# Patient Record
Sex: Female | Born: 1955
Health system: Southern US, Community
[De-identification: ages and names within clinical notes are randomized; demographics above are authoritative.]

## PROBLEM LIST (undated history)

## (undated) DIAGNOSIS — K219 Gastro-esophageal reflux disease without esophagitis: Secondary | ICD-10-CM

## (undated) DIAGNOSIS — T8859XA Other complications of anesthesia, initial encounter: Secondary | ICD-10-CM

## (undated) DIAGNOSIS — I499 Cardiac arrhythmia, unspecified: Secondary | ICD-10-CM

## (undated) DIAGNOSIS — G43909 Migraine, unspecified, not intractable, without status migrainosus: Secondary | ICD-10-CM

## (undated) DIAGNOSIS — K5790 Diverticulosis of intestine, part unspecified, without perforation or abscess without bleeding: Secondary | ICD-10-CM

## (undated) DIAGNOSIS — C801 Malignant (primary) neoplasm, unspecified: Secondary | ICD-10-CM

## (undated) DIAGNOSIS — R109 Unspecified abdominal pain: Secondary | ICD-10-CM

## (undated) DIAGNOSIS — C50919 Malignant neoplasm of unspecified site of unspecified female breast: Secondary | ICD-10-CM

## (undated) DIAGNOSIS — M5416 Radiculopathy, lumbar region: Secondary | ICD-10-CM

## (undated) DIAGNOSIS — M1990 Unspecified osteoarthritis, unspecified site: Secondary | ICD-10-CM

## (undated) DIAGNOSIS — D126 Benign neoplasm of colon, unspecified: Secondary | ICD-10-CM

## (undated) DIAGNOSIS — Z923 Personal history of irradiation: Secondary | ICD-10-CM

## (undated) DIAGNOSIS — Z5189 Encounter for other specified aftercare: Secondary | ICD-10-CM

## (undated) DIAGNOSIS — M810 Age-related osteoporosis without current pathological fracture: Secondary | ICD-10-CM

## (undated) DIAGNOSIS — G4700 Insomnia, unspecified: Secondary | ICD-10-CM

## (undated) DIAGNOSIS — F32A Depression, unspecified: Secondary | ICD-10-CM

## (undated) DIAGNOSIS — Z9221 Personal history of antineoplastic chemotherapy: Secondary | ICD-10-CM

## (undated) DIAGNOSIS — E559 Vitamin D deficiency, unspecified: Secondary | ICD-10-CM

## (undated) DIAGNOSIS — R112 Nausea with vomiting, unspecified: Secondary | ICD-10-CM

## (undated) DIAGNOSIS — T4145XA Adverse effect of unspecified anesthetic, initial encounter: Secondary | ICD-10-CM

## (undated) DIAGNOSIS — R197 Diarrhea, unspecified: Secondary | ICD-10-CM

## (undated) DIAGNOSIS — I2510 Atherosclerotic heart disease of native coronary artery without angina pectoris: Secondary | ICD-10-CM

## (undated) DIAGNOSIS — Z973 Presence of spectacles and contact lenses: Secondary | ICD-10-CM

## (undated) DIAGNOSIS — E785 Hyperlipidemia, unspecified: Secondary | ICD-10-CM

## (undated) DIAGNOSIS — F419 Anxiety disorder, unspecified: Secondary | ICD-10-CM

## (undated) DIAGNOSIS — F329 Major depressive disorder, single episode, unspecified: Secondary | ICD-10-CM

## (undated) DIAGNOSIS — M5126 Other intervertebral disc displacement, lumbar region: Secondary | ICD-10-CM

## (undated) DIAGNOSIS — Z9889 Other specified postprocedural states: Secondary | ICD-10-CM

## (undated) DIAGNOSIS — G5790 Unspecified mononeuropathy of unspecified lower limb: Secondary | ICD-10-CM

## (undated) HISTORY — DX: Unspecified abdominal pain: R10.9

## (undated) HISTORY — DX: Age-related osteoporosis without current pathological fracture: M81.0

## (undated) HISTORY — DX: Unspecified osteoarthritis, unspecified site: M19.90

## (undated) HISTORY — PX: HIP SURGERY: SHX245

## (undated) HISTORY — DX: Gastro-esophageal reflux disease without esophagitis: K21.9

## (undated) HISTORY — DX: Insomnia, unspecified: G47.00

## (undated) HISTORY — DX: Vitamin D deficiency, unspecified: E55.9

## (undated) HISTORY — DX: Hyperlipidemia, unspecified: E78.5

## (undated) HISTORY — DX: Encounter for other specified aftercare: Z51.89

## (undated) HISTORY — DX: Benign neoplasm of colon, unspecified: D12.6

## (undated) HISTORY — PX: COLONOSCOPY: SHX174

## (undated) HISTORY — DX: Radiculopathy, lumbar region: M54.16

## (undated) HISTORY — DX: Diarrhea, unspecified: R19.7

## (undated) HISTORY — DX: Other intervertebral disc displacement, lumbar region: M51.26

---
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
## 1980-10-07 HISTORY — PX: DILATION AND CURETTAGE OF UTERUS: SHX78

## 2003-10-08 DIAGNOSIS — Z5189 Encounter for other specified aftercare: Secondary | ICD-10-CM

## 2003-10-08 HISTORY — DX: Encounter for other specified aftercare: Z51.89

## 2003-10-08 HISTORY — PX: HIP SURGERY: SHX245

## 2005-10-07 HISTORY — PX: WRIST SURGERY: SHX841

## 2006-10-07 DIAGNOSIS — Z923 Personal history of irradiation: Secondary | ICD-10-CM

## 2006-10-07 DIAGNOSIS — C801 Malignant (primary) neoplasm, unspecified: Secondary | ICD-10-CM

## 2006-10-07 HISTORY — PX: BREAST LUMPECTOMY: SHX2

## 2006-10-07 HISTORY — PX: BREAST EXCISIONAL BIOPSY: SUR124

## 2006-10-07 HISTORY — DX: Malignant (primary) neoplasm, unspecified: C80.1

## 2006-10-07 HISTORY — DX: Personal history of irradiation: Z92.3

## 2009-10-07 DIAGNOSIS — C801 Malignant (primary) neoplasm, unspecified: Secondary | ICD-10-CM

## 2009-10-07 HISTORY — DX: Malignant (primary) neoplasm, unspecified: C80.1

## 2010-10-07 HISTORY — PX: CARDIAC CATHETERIZATION: SHX172

## 2013-02-11 ENCOUNTER — Other Ambulatory Visit (HOSPITAL_COMMUNITY): Payer: Self-pay | Admitting: Internal Medicine

## 2013-02-11 DIAGNOSIS — Z139 Encounter for screening, unspecified: Secondary | ICD-10-CM

## 2013-02-11 DIAGNOSIS — C50911 Malignant neoplasm of unspecified site of right female breast: Secondary | ICD-10-CM

## 2013-02-20 ENCOUNTER — Emergency Department (HOSPITAL_COMMUNITY): Payer: PRIVATE HEALTH INSURANCE

## 2013-02-20 ENCOUNTER — Emergency Department (HOSPITAL_COMMUNITY)
Admission: EM | Admit: 2013-02-20 | Discharge: 2013-02-20 | Disposition: A | Discharge status: Home / Self Care | Payer: PRIVATE HEALTH INSURANCE | Attending: Emergency Medicine | Admitting: Emergency Medicine

## 2013-02-20 ENCOUNTER — Encounter (HOSPITAL_COMMUNITY): Payer: Self-pay | Admitting: Emergency Medicine

## 2013-02-20 DIAGNOSIS — Z7982 Long term (current) use of aspirin: Secondary | ICD-10-CM | POA: Insufficient documentation

## 2013-02-20 DIAGNOSIS — Y92009 Unspecified place in unspecified non-institutional (private) residence as the place of occurrence of the external cause: Secondary | ICD-10-CM | POA: Insufficient documentation

## 2013-02-20 DIAGNOSIS — W010XXA Fall on same level from slipping, tripping and stumbling without subsequent striking against object, initial encounter: Secondary | ICD-10-CM | POA: Insufficient documentation

## 2013-02-20 DIAGNOSIS — Y939 Activity, unspecified: Secondary | ICD-10-CM | POA: Insufficient documentation

## 2013-02-20 DIAGNOSIS — Z79899 Other long term (current) drug therapy: Secondary | ICD-10-CM | POA: Insufficient documentation

## 2013-02-20 DIAGNOSIS — S93409A Sprain of unspecified ligament of unspecified ankle, initial encounter: Secondary | ICD-10-CM | POA: Insufficient documentation

## 2013-02-20 MED ORDER — HYDROCODONE-ACETAMINOPHEN 5-325 MG PO TABS: 1.0000 | ORAL_TABLET | ORAL | Status: DC | PRN

## 2013-02-20 MED ORDER — HYDROCODONE-ACETAMINOPHEN 5-325 MG PO TABS
1.0000 | ORAL_TABLET | Freq: Once | ORAL | Status: AC
  Administered 2013-02-20: 1 via ORAL
  Filled 2013-02-20: qty 1

## 2013-02-20 NOTE — ED Provider Notes (Signed)
History     CSN: [id number]  Arrival date & time 02/20/13  1053   First MD Initiated Contact with Patient 02/20/13 1111      Chief Complaint  Patient presents with  . Foot Injury    (Consider location/radiation/quality/duration/timing/severity/associated sxs/prior treatment) Patient is a 57 y.o. female presenting with foot injury. The history is provided by the patient.  Foot Injury Location:  Foot Time since incident:  4 hours Injury: yes   Mechanism of injury: fall   Fall:    Fall occurred:  In the bathroom   Impact surface:  Wall   Point of impact:  Feet Foot location:  R foot Pain details:    Quality:  Throbbing   Radiates to:  Does not radiate   Severity:  Moderate   Onset quality:  Sudden   Timing:  Constant   Progression:  Worsening Chronicity: New on old (hx of previous fx of the foot) Dislocation: no   Foreign body present: fb in the foot for nearly 20 years, but no new fb. Prior injury to area:  Yes Relieved by:  Nothing Worsened by:  Bearing weight Ineffective treatments:  None tried Associated symptoms: decreased ROM   Associated symptoms: no back pain and no neck pain   Risk factors comment:  Previous fx of the right 5th MT   History reviewed. No pertinent past medical history.  History reviewed. No pertinent past surgical history.  History reviewed. No pertinent family history.  History  Substance Use Topics  . Smoking status: Not on file  . Smokeless tobacco: Not on file  . Alcohol Use: Not on file    OB History   Grav Para Term Preterm Abortions TAB SAB Ect Mult Living                  Review of Systems  Constitutional: Negative for activity change.       All ROS Neg except as noted in HPI  HENT: Negative for nosebleeds and neck pain.   Eyes: Negative for photophobia and discharge.  Respiratory: Negative for cough, shortness of breath and wheezing.   Cardiovascular: Negative for chest pain and palpitations.  Gastrointestinal:  Negative for abdominal pain and blood in stool.  Genitourinary: Negative for dysuria, frequency and hematuria.  Musculoskeletal: Positive for arthralgias. Negative for back pain.  Skin: Negative.   Neurological: Negative for dizziness, seizures and speech difficulty.  Psychiatric/Behavioral: Negative for hallucinations and confusion.    Allergies  Review of patient's allergies indicates no known allergies.  Home Medications   Current Outpatient Rx  Name  Route  Sig  Dispense  Refill  . ALPRAZolam (XANAX) 1 MG tablet   Oral   Take 1 mg by mouth at bedtime as needed for sleep.         Marland Kitchen aspirin 325 MG tablet   Oral   Take 325 mg by mouth daily.         Marland Kitchen atorvastatin (LIPITOR) 40 MG tablet   Oral   Take 40 mg by mouth daily.         Marland Kitchen ibuprofen (ADVIL,MOTRIN) 200 MG tablet   Oral   Take 800 mg by mouth every 6 (six) hours as needed for pain.         . Levomilnacipran HCl ER (FETZIMA) 40 MG CP24   Oral   Take 1 tablet by mouth daily.         Marland Kitchen omeprazole (PRILOSEC) 20 MG capsule   Oral  Take 20 mg by mouth daily as needed (acid reflux).           BP 111/72  Temp(Src) 97.8 F (36.6 C) (Oral)  Resp 18  Physical Exam  Nursing note and vitals reviewed. Constitutional: She is oriented to person, place, and time. She appears well-developed and well-nourished.  Non-toxic appearance.  HENT:  Head: Normocephalic.  Right Ear: Tympanic membrane and external ear normal.  Left Ear: Tympanic membrane and external ear normal.  Eyes: EOM and lids are normal. Pupils are equal, round, and reactive to light.  Neck: Normal range of motion. Neck supple. Carotid bruit is not present.  Cardiovascular: Normal rate, regular rhythm, normal heart sounds, intact distal pulses and normal pulses.   Pulmonary/Chest: Breath sounds normal. No respiratory distress.  Abdominal: Soft. Bowel sounds are normal. There is no tenderness. There is no guarding.  Musculoskeletal: Normal range  of motion. She exhibits tenderness.  Mild Swelling of the lateral malleolus.  Soreness with ROM.  Lymphadenopathy:       Head (right side): No submandibular adenopathy present.       Head (left side): No submandibular adenopathy present.    She has no cervical adenopathy.  Neurological: She is alert and oriented to person, place, and time. She has normal strength. No cranial nerve deficit or sensory deficit.  Skin: Skin is warm and dry.  Psychiatric: She has a normal mood and affect. Her speech is normal.    ED Course  Procedures (including critical care time)  Labs Reviewed - No data to display Dg Foot Complete Right  02/20/2013   *RADIOLOGY REPORT*  Clinical Data: Foot injury with foot pain.  History of remote foreign body in the right foot and remote fifth metatarsal fracture.  RIGHT FOOT COMPLETE - 3+ VIEW  Comparison: None  Findings: A remote fracture of the distal fifth metatarsal is noted. A linear radiopaque foreign body in the dorsal soft tissues at the level of the third and fourth metatarsal heads noted. There is no evidence of acute fracture, subluxation or dislocation. The Lisfranc joints are intact. No focal bony lesions are identified.  IMPRESSION: No evidence of acute bony abnormality.  Linear radiopaque foreign body in the dorsal soft tissues - remote by history.   Original Report Authenticated By: Harmon Pier, M.D.     No diagnosis found.    MDM  I have reviewed nursing notes, vital signs, and all appropriate lab and imaging results for this patient. The right foot is neurovascularly intact. Xray is negative for new fx or dislocation. Pt fitted with ASO splint  And ice pack. Pt to follow up with ortho if any changes or problem.       Kathie Dike, PA-C 02/20/13 1242

## 2013-02-20 NOTE — ED Notes (Signed)
Pt states she tripped on fell at home this morning and c/o right foot pain. Mild swelling to right foot. Pt ambulatory with limp.

## 2013-02-21 NOTE — ED Provider Notes (Signed)
Medical screening examination/treatment/procedure(s) were performed by non-physician practitioner and as supervising physician I was immediately available for consultation/collaboration.   Joya Gaskins, MD 02/21/13 [phone]

## 2013-02-22 ENCOUNTER — Ambulatory Visit (HOSPITAL_COMMUNITY): Payer: Self-pay

## 2013-02-28 ENCOUNTER — Emergency Department (HOSPITAL_COMMUNITY): Payer: PRIVATE HEALTH INSURANCE

## 2013-02-28 ENCOUNTER — Emergency Department (HOSPITAL_COMMUNITY)
Admission: EM | Admit: 2013-02-28 | Discharge: 2013-02-28 | Disposition: A | Discharge status: Home / Self Care | Payer: PRIVATE HEALTH INSURANCE | Attending: Emergency Medicine | Admitting: Emergency Medicine

## 2013-02-28 ENCOUNTER — Encounter (HOSPITAL_COMMUNITY): Payer: Self-pay | Admitting: Emergency Medicine

## 2013-02-28 DIAGNOSIS — E7800 Pure hypercholesterolemia, unspecified: Secondary | ICD-10-CM | POA: Insufficient documentation

## 2013-02-28 DIAGNOSIS — Z859 Personal history of malignant neoplasm, unspecified: Secondary | ICD-10-CM | POA: Insufficient documentation

## 2013-02-28 DIAGNOSIS — R079 Chest pain, unspecified: Secondary | ICD-10-CM

## 2013-02-28 DIAGNOSIS — I2510 Atherosclerotic heart disease of native coronary artery without angina pectoris: Secondary | ICD-10-CM | POA: Insufficient documentation

## 2013-02-28 DIAGNOSIS — M129 Arthropathy, unspecified: Secondary | ICD-10-CM | POA: Insufficient documentation

## 2013-02-28 DIAGNOSIS — F17200 Nicotine dependence, unspecified, uncomplicated: Secondary | ICD-10-CM | POA: Insufficient documentation

## 2013-02-28 DIAGNOSIS — R51 Headache: Secondary | ICD-10-CM | POA: Insufficient documentation

## 2013-02-28 DIAGNOSIS — Z7982 Long term (current) use of aspirin: Secondary | ICD-10-CM | POA: Insufficient documentation

## 2013-02-28 DIAGNOSIS — F411 Generalized anxiety disorder: Secondary | ICD-10-CM | POA: Insufficient documentation

## 2013-02-28 DIAGNOSIS — R109 Unspecified abdominal pain: Secondary | ICD-10-CM | POA: Insufficient documentation

## 2013-02-28 DIAGNOSIS — R0789 Other chest pain: Secondary | ICD-10-CM | POA: Insufficient documentation

## 2013-02-28 DIAGNOSIS — Z79899 Other long term (current) drug therapy: Secondary | ICD-10-CM | POA: Insufficient documentation

## 2013-02-28 DIAGNOSIS — Z9889 Other specified postprocedural states: Secondary | ICD-10-CM | POA: Insufficient documentation

## 2013-02-28 HISTORY — DX: Anxiety disorder, unspecified: F41.9

## 2013-02-28 HISTORY — DX: Unspecified osteoarthritis, unspecified site: M19.90

## 2013-02-28 HISTORY — DX: Atherosclerotic heart disease of native coronary artery without angina pectoris: I25.10

## 2013-02-28 HISTORY — DX: Malignant (primary) neoplasm, unspecified: C80.1

## 2013-02-28 LAB — COMPREHENSIVE METABOLIC PANEL
AST: 27 U/L (ref 0–37)
Albumin: 3.8 g/dL (ref 3.5–5.2)
BUN: 11 mg/dL (ref 6–23)
Calcium: 9.2 mg/dL (ref 8.4–10.5)
Chloride: 101 mEq/L (ref 96–112)
Creatinine, Ser: 0.62 mg/dL (ref 0.50–1.10)
GFR calc Af Amer: >90 mL/min (ref >90)
GFR calc non Af Amer: >90 mL/min (ref >90)
Total Bilirubin: 0.3 mg/dL (ref 0.3–1.2)
Total Protein: 6.9 g/dL (ref 6.0–8.3)

## 2013-02-28 LAB — CBC WITH DIFFERENTIAL/PLATELET
Basophils Absolute: 0 10*3/uL (ref 0.0–0.1)
Basophils Relative: 1 % (ref 0–1)
Eosinophils Absolute: 0.1 10*3/uL (ref 0.0–0.7)
Eosinophils Relative: 1 % (ref 0–5)
HCT: 39.7 % (ref 36.0–46.0)
MCH: 32 pg (ref 26.0–34.0)
MCHC: 33.2 g/dL (ref 30.0–36.0)
MCV: 96.4 fL (ref 78.0–100.0)
Monocytes Absolute: 0.5 10*3/uL (ref 0.1–1.0)
Platelets: 317 10*3/uL (ref 150–400)
RDW: 12.7 % (ref 11.5–15.5)
WBC: 6.3 10*3/uL (ref 4.0–10.5)

## 2013-02-28 LAB — TROPONIN I: Troponin I: <0.3 ng/mL (ref <0.30)

## 2013-02-28 MED ORDER — HYDROCODONE-ACETAMINOPHEN 5-325 MG PO TABS
1.0000 | ORAL_TABLET | Freq: Once | ORAL | Status: AC
  Administered 2013-02-28: 1 via ORAL
  Filled 2013-02-28: qty 1

## 2013-02-28 MED ORDER — GI COCKTAIL ~~LOC~~
30.0000 mL | Freq: Once | ORAL | Status: AC
  Administered 2013-02-28: 30 mL via ORAL
  Filled 2013-02-28: qty 30

## 2013-02-28 MED ORDER — FAMOTIDINE 20 MG PO TABS
20.0000 mg | ORAL_TABLET | Freq: Once | ORAL | Status: AC
  Administered 2013-02-28: 20 mg via ORAL
  Filled 2013-02-28: qty 1

## 2013-02-28 MED ORDER — OXYCODONE-ACETAMINOPHEN 5-325 MG PO TABS: 1.0000 | ORAL_TABLET | Freq: Four times a day (QID) | ORAL | Status: DC | PRN

## 2013-02-28 MED ORDER — SODIUM CHLORIDE 0.9 % IV SOLN
INTRAVENOUS | Status: DC
  Administered 2013-02-28: 19:00:00 via INTRAVENOUS

## 2013-02-28 MED ORDER — ALPRAZOLAM 0.5 MG PO TABS
0.5000 mg | ORAL_TABLET | Freq: Once | ORAL | Status: AC
  Administered 2013-02-28: 0.5 mg via ORAL
  Filled 2013-02-28: qty 1

## 2013-02-28 NOTE — ED Notes (Signed)
Pt under a lot of stress this week. One death and an MI in family.

## 2013-02-28 NOTE — ED Notes (Signed)
Pt c/o chest tightness, headache and nausea. Denies dizziness/v/d/sob. Pt alert/oreinted. Nondiaphoretic.

## 2013-02-28 NOTE — ED Provider Notes (Addendum)
History     CSN: [id number]  Arrival date & time 02/28/13  1759   First MD Initiated Contact with Patient 02/28/13 1827      Chief Complaint  Patient presents with  . Chest Pain    (Consider location/radiation/quality/duration/timing/severity/associated sxs/prior treatment) Patient is a 57 y.o. female presenting with chest pain. The history is provided by the patient.  Chest Pain Associated symptoms: abdominal pain and headache   Associated symptoms: no back pain, no cough, no fever, no numbness, no palpitations, no shortness of breath, not vomiting and no weakness   pt c/o epigastric/xiphoid area pain today. Constant. Dull. Non radiating. Pt tearful, noting tremendous stress recently due to loss of family member to illness, and another family member w cva. Pt w hx prior cath, [facility] in [place], states was told had a 20% blockage. Pain/symptoms are at rest.  Denies any other recent cp or discomfort even w exertion. No associated nv, diaphoresis or sob.  Pain is midline and not pleuritic. No leg pain or swelling. No hx dvt or pe. Family hx heart disease in older age. Pt denies cough or uri c/o. No fever or chills. ?hx gerd. No hx gallstones or pud. Pt also notes gradual onset frontal headache today, dull. No eye pain or change in vision. No neck pain or stiffness. No numbness/weakness.     Past Medical History  Diagnosis Date  . Hypercholesteremia   . Coronary artery disease   . Cancer   . Arthritis   . Anxiety     Past Surgical History  Procedure Laterality Date  . Cardiac catheterization    . Breast lumpectomy    . Hip surgery      History reviewed. No pertinent family history.  History  Substance Use Topics  . Smoking status: Current Every Day Smoker  . Smokeless tobacco: Not on file  . Alcohol Use: No    OB History   Grav Para Term Preterm Abortions TAB SAB Ect Mult Living                  Review of Systems  Constitutional: Negative for  fever and chills.  HENT: Negative for neck pain.   Eyes: Negative for redness.  Respiratory: Negative for cough and shortness of breath.   Cardiovascular: Positive for chest pain. Negative for palpitations and leg swelling.  Gastrointestinal: Positive for abdominal pain. Negative for vomiting, diarrhea and abdominal distention.  Genitourinary: Negative for flank pain.  Musculoskeletal: Negative for back pain.  Skin: Negative for rash.  Neurological: Positive for headaches. Negative for weakness and numbness.  Hematological: Does not bruise/bleed easily.  Psychiatric/Behavioral: Negative for confusion.    Allergies  Review of patient's allergies indicates no known allergies.  Home Medications   Current Outpatient Rx  Name  Route  Sig  Dispense  Refill  . ALPRAZolam (XANAX) 1 MG tablet   Oral   Take 1 mg by mouth at bedtime as needed for sleep.         Marland Kitchen aspirin 325 MG tablet   Oral   Take 325 mg by mouth daily.         Marland Kitchen atorvastatin (LIPITOR) 40 MG tablet   Oral   Take 40 mg by mouth daily.         Marland Kitchen HYDROcodone-acetaminophen (NORCO/VICODIN) 5-325 MG per tablet   Oral   Take 1 tablet by mouth every 4 (four) hours as needed for pain.   10 tablet   0   .  ibuprofen (ADVIL,MOTRIN) 200 MG tablet   Oral   Take 800 mg by mouth every 6 (six) hours as needed for pain.         . Levomilnacipran HCl ER (FETZIMA) 40 MG CP24   Oral   Take 1 tablet by mouth daily.         Marland Kitchen omeprazole (PRILOSEC) 20 MG capsule   Oral   Take 20 mg by mouth daily as needed (acid reflux).           BP 113/56  Pulse 104  Temp(Src) 98.6 F (37 C) (Oral)  Resp 17  Ht 5\' 5"  (1.651 m)  Wt 169 lb (76.658 kg)  BMI 28.12 kg/m2  SpO2 100%  Physical Exam  Nursing note and vitals reviewed. Constitutional: She is oriented to person, place, and time. She appears well-developed and well-nourished. No distress.  HENT:  Nose: Nose normal.  Mouth/Throat: Oropharynx is clear and moist.   Eyes: Conjunctivae are normal. Pupils are equal, round, and reactive to light. No scleral icterus.  Neck: Neck supple. No tracheal deviation present.  Cardiovascular: Normal rate, regular rhythm, normal heart sounds and intact distal pulses.  Exam reveals no gallop and no friction rub.   No murmur heard. Pulmonary/Chest: Effort normal and breath sounds normal. No respiratory distress.  Abdominal: Soft. Normal appearance and bowel sounds are normal. She exhibits no distension. There is no tenderness.  Genitourinary:  No cva tenderness  Musculoskeletal: She exhibits no edema and no tenderness.  Neurological: She is alert and oriented to person, place, and time.  Skin: Skin is warm and dry. No rash noted.  Psychiatric:  Very anxious/tearful.     ED Course  Procedures (including critical care time)   Results for orders placed during the hospital encounter of 02/28/13  CBC WITH DIFFERENTIAL      Result Value Range   WBC 6.3  4.0 - 10.5 K/uL   RBC 4.12  3.87 - 5.11 MIL/uL   Hemoglobin 13.2  12.0 - 15.0 g/dL   HCT 28.4  13.2 - 44.0 %   MCV 96.4  78.0 - 100.0 fL   MCH 32.0  26.0 - 34.0 pg   MCHC 33.2  30.0 - 36.0 g/dL   RDW 10.2  72.5 - 36.6 %   Platelets 317  150 - 400 K/uL   Neutrophils Relative % 71  43 - 77 %   Neutro Abs 4.5  1.7 - 7.7 K/uL   Lymphocytes Relative 19  12 - 46 %   Lymphs Abs 1.2  0.7 - 4.0 K/uL   Monocytes Relative 8  3 - 12 %   Monocytes Absolute 0.5  0.1 - 1.0 K/uL   Eosinophils Relative 1  0 - 5 %   Eosinophils Absolute 0.1  0.0 - 0.7 K/uL   Basophils Relative 1  0 - 1 %   Basophils Absolute 0.0  0.0 - 0.1 K/uL  TROPONIN I      Result Value Range   Troponin I <0.30  <0.30 ng/mL  COMPREHENSIVE METABOLIC PANEL      Result Value Range   Sodium 137  135 - 145 mEq/L   Potassium 3.7  3.5 - 5.1 mEq/L   Chloride 101  96 - 112 mEq/L   CO2 26  19 - 32 mEq/L   Glucose, Bld 95  70 - 99 mg/dL   BUN 11  6 - 23 mg/dL   Creatinine, Ser 4.40  0.50 - 1.10 mg/dL  Calcium 9.2  8.4 - 10.5 mg/dL   Total Protein 6.9  6.0 - 8.3 g/dL   Albumin 3.8  3.5 - 5.2 g/dL   AST 27  0 - 37 U/L   ALT 26  0 - 35 U/L   Alkaline Phosphatase 69  39 - 117 U/L   Total Bilirubin 0.3  0.3 - 1.2 mg/dL   GFR calc non Af Amer >90  >90 mL/min   GFR calc Af Amer >90  >90 mL/min  LIPASE, BLOOD      Result Value Range   Lipase 33  11 - 59 U/L   Dg Chest 2 View  02/28/2013   *RADIOLOGY REPORT*  Clinical Data: Chest pain.  Prior history breast cancer with lumpectomy.  CHEST - 2 VIEW  Comparison: None.  Findings: Cardiomediastinal silhouette unremarkable.  Prominent bronchovascular markings diffusely and moderate central peribronchial thickening.  Lungs otherwise clear.  Pulmonary vascularity normal.  No pneumothorax.  No pleural effusions.  Mild degenerative changes involving the thoracic spine.  IMPRESSION: Moderate changes of bronchitis and/or asthma which may be acute or chronic.   Original Report Authenticated By: Hulan Saas, M.D.       MDM  Iv ns. Ecg. Labs. Cxr.  Reviewed nursing notes and prior charts for additional history.   Pepcid, gi cocktail, vicodin 1 po, xanax .5 po.   Date: 02/28/2013  Rate: 94  Rhythm: normal sinus rhythm  QRS Axis: normal  Intervals: normal  ST/T Wave abnormalities: normal  Conduction Disutrbances:none  Narrative Interpretation:   Old EKG Reviewed: none available  Records from [facility] in [place] obtained. ecg unchanged from prior.  Pt w cath 6/13 w non obstructive cad, single 20% lesion noted.   Recheck pt calmer, alert, no distress.   Recheck w above meds for symptom relief, symptoms resolved.  Recheck pt, calm, alert. No cp or sob. Symptoms resolved. States feels ready for d/c.  States has close f/u w pcp, [facility], already arranged.    Pt/fam requests small amt pain med (percocet) for home.          Suzi Roots, MD 02/28/13 2049

## 2013-03-12 ENCOUNTER — Other Ambulatory Visit (HOSPITAL_COMMUNITY): Payer: Self-pay | Admitting: Internal Medicine

## 2013-03-12 DIAGNOSIS — R1013 Epigastric pain: Secondary | ICD-10-CM

## 2013-03-12 DIAGNOSIS — R109 Unspecified abdominal pain: Secondary | ICD-10-CM

## 2013-03-17 ENCOUNTER — Ambulatory Visit (HOSPITAL_COMMUNITY)
Admission: RE | Admit: 2013-03-17 | Discharge: 2013-03-17 | Disposition: A | Discharge status: Home / Self Care | Payer: PRIVATE HEALTH INSURANCE | Source: Ambulatory Visit | Attending: Internal Medicine | Admitting: Internal Medicine

## 2013-03-17 DIAGNOSIS — Z853 Personal history of malignant neoplasm of breast: Secondary | ICD-10-CM | POA: Insufficient documentation

## 2013-03-17 DIAGNOSIS — C50911 Malignant neoplasm of unspecified site of right female breast: Secondary | ICD-10-CM

## 2013-03-18 ENCOUNTER — Ambulatory Visit (HOSPITAL_COMMUNITY)
Admission: RE | Admit: 2013-03-18 | Discharge: 2013-03-18 | Disposition: A | Discharge status: Home / Self Care | Payer: PRIVATE HEALTH INSURANCE | Source: Ambulatory Visit | Attending: Internal Medicine | Admitting: Internal Medicine

## 2013-03-18 ENCOUNTER — Other Ambulatory Visit (HOSPITAL_COMMUNITY): Payer: Self-pay | Admitting: Internal Medicine

## 2013-03-18 DIAGNOSIS — R110 Nausea: Secondary | ICD-10-CM | POA: Insufficient documentation

## 2013-03-18 DIAGNOSIS — R109 Unspecified abdominal pain: Secondary | ICD-10-CM | POA: Insufficient documentation

## 2013-03-18 DIAGNOSIS — R1013 Epigastric pain: Secondary | ICD-10-CM

## 2013-03-18 DIAGNOSIS — K838 Other specified diseases of biliary tract: Secondary | ICD-10-CM | POA: Insufficient documentation

## 2013-04-05 ENCOUNTER — Emergency Department (HOSPITAL_COMMUNITY)
Admission: EM | Admit: 2013-04-05 | Discharge: 2013-04-05 | Disposition: A | Discharge status: Home / Self Care | Payer: PRIVATE HEALTH INSURANCE | Attending: Emergency Medicine | Admitting: Emergency Medicine

## 2013-04-05 ENCOUNTER — Encounter (HOSPITAL_COMMUNITY): Payer: Self-pay | Admitting: *Deleted

## 2013-04-05 ENCOUNTER — Emergency Department (HOSPITAL_COMMUNITY): Payer: PRIVATE HEALTH INSURANCE

## 2013-04-05 DIAGNOSIS — Z853 Personal history of malignant neoplasm of breast: Secondary | ICD-10-CM | POA: Insufficient documentation

## 2013-04-05 DIAGNOSIS — F17200 Nicotine dependence, unspecified, uncomplicated: Secondary | ICD-10-CM | POA: Insufficient documentation

## 2013-04-05 DIAGNOSIS — M129 Arthropathy, unspecified: Secondary | ICD-10-CM | POA: Insufficient documentation

## 2013-04-05 DIAGNOSIS — Z79899 Other long term (current) drug therapy: Secondary | ICD-10-CM | POA: Insufficient documentation

## 2013-04-05 DIAGNOSIS — E7800 Pure hypercholesterolemia, unspecified: Secondary | ICD-10-CM | POA: Insufficient documentation

## 2013-04-05 DIAGNOSIS — J40 Bronchitis, not specified as acute or chronic: Secondary | ICD-10-CM

## 2013-04-05 DIAGNOSIS — J209 Acute bronchitis, unspecified: Secondary | ICD-10-CM | POA: Insufficient documentation

## 2013-04-05 DIAGNOSIS — I2510 Atherosclerotic heart disease of native coronary artery without angina pectoris: Secondary | ICD-10-CM | POA: Insufficient documentation

## 2013-04-05 DIAGNOSIS — F411 Generalized anxiety disorder: Secondary | ICD-10-CM | POA: Insufficient documentation

## 2013-04-05 MED ORDER — ONDANSETRON 4 MG PO TBDP: 4.0000 mg | ORAL_TABLET | Freq: Three times a day (TID) | ORAL | Status: DC | PRN

## 2013-04-05 MED ORDER — IPRATROPIUM BROMIDE 0.02 % IN SOLN
0.5000 mg | Freq: Once | RESPIRATORY_TRACT | Status: AC
  Administered 2013-04-05: 0.5 mg via RESPIRATORY_TRACT
  Filled 2013-04-05: qty 2.5

## 2013-04-05 MED ORDER — ALBUTEROL SULFATE HFA 108 (90 BASE) MCG/ACT IN AERS: 1.0000 | INHALATION_SPRAY | Freq: Four times a day (QID) | RESPIRATORY_TRACT | Status: DC | PRN

## 2013-04-05 MED ORDER — GUAIFENESIN-CODEINE 100-10 MG/5ML PO SYRP: 5.0000 mL | ORAL_SOLUTION | Freq: Three times a day (TID) | ORAL | Status: DC | PRN

## 2013-04-05 MED ORDER — AZITHROMYCIN 250 MG PO TABS
500.0000 mg | ORAL_TABLET | Freq: Once | ORAL | Status: AC
  Administered 2013-04-05: 500 mg via ORAL
  Filled 2013-04-05: qty 2

## 2013-04-05 MED ORDER — PREDNISONE 10 MG PO TABS: 20.0000 mg | ORAL_TABLET | Freq: Every day | ORAL | Status: DC

## 2013-04-05 MED ORDER — GUAIFENESIN-CODEINE 100-10 MG/5ML PO SOLN
5.0000 mL | Freq: Once | ORAL | Status: AC
  Administered 2013-04-05: 5 mL via ORAL
  Filled 2013-04-05: qty 5

## 2013-04-05 MED ORDER — AZITHROMYCIN 250 MG PO TABS: ORAL_TABLET | ORAL | Status: DC

## 2013-04-05 MED ORDER — PREDNISONE 50 MG PO TABS
60.0000 mg | ORAL_TABLET | Freq: Once | ORAL | Status: AC
  Administered 2013-04-05: 60 mg via ORAL
  Filled 2013-04-05: qty 1

## 2013-04-05 MED ORDER — HYDROCODONE-ACETAMINOPHEN 5-325 MG PO TABS
1.0000 | ORAL_TABLET | Freq: Once | ORAL | Status: AC
  Administered 2013-04-05: 1 via ORAL
  Filled 2013-04-05: qty 1

## 2013-04-05 MED ORDER — ALBUTEROL SULFATE (5 MG/ML) 0.5% IN NEBU
5.0000 mg | INHALATION_SOLUTION | Freq: Once | RESPIRATORY_TRACT | Status: AC
  Administered 2013-04-05: 5 mg via RESPIRATORY_TRACT
  Filled 2013-04-05: qty 1

## 2013-04-05 MED ORDER — HYDROCODONE-ACETAMINOPHEN 5-325 MG PO TABS: 1.0000 | ORAL_TABLET | ORAL | Status: DC | PRN

## 2013-04-05 NOTE — ED Provider Notes (Signed)
History    CSN: [id number] Arrival date & time 04/05/13  0159  First MD Initiated Contact with Patient 04/05/13 [phone]     Chief Complaint  Patient presents with  . Cough   (Consider location/radiation/quality/duration/timing/severity/associated sxs/prior Treatment) HPI HPI Comments: Sandra Conrad is a 57 y.o. female who presents to the Emergency Department complaining of cough for one week that is worse. Chest hurts when she cough. Non productive. No fevers. Shortness of breath after a coughing spell. Has not taken any medicines. Stopped smoking with the onset of the cough.   PCP Dr. Margo Aye  Past Medical History  Diagnosis Date  . Hypercholesteremia   . Coronary artery disease   . Cancer   . Arthritis   . Anxiety    Past Surgical History  Procedure Laterality Date  . Cardiac catheterization    . Breast lumpectomy    . Hip surgery    . Wrist surgery    . Dilation and curettage of uterus     No family history on file. History  Substance Use Topics  . Smoking status: Current Every Day Smoker  . Smokeless tobacco: Not on file  . Alcohol Use: No   OB History   Grav Para Term Preterm Abortions TAB SAB Ect Mult Living                 Review of Systems  Constitutional: Negative for fever.       10 Systems reviewed and are negative for acute change except as noted in the HPI.  HENT: Negative for congestion.   Eyes: Negative for discharge and redness.  Respiratory: Positive for cough. Negative for shortness of breath.        Chest discomfort  Cardiovascular: Negative for chest pain.  Gastrointestinal: Negative for vomiting and abdominal pain.  Musculoskeletal: Negative for back pain.  Skin: Negative for rash.  Neurological: Negative for syncope, numbness and headaches.  Psychiatric/Behavioral:       No behavior change.    Allergies  Review of patient's allergies indicates no known allergies.  Home Medications   Current Outpatient Rx  Name  Route  Sig  Dispense   Refill  . ALPRAZolam (XANAX) 1 MG tablet   Oral   Take 1 mg by mouth at bedtime as needed for sleep.         Marland Kitchen aspirin 325 MG tablet   Oral   Take 325 mg by mouth daily.         Marland Kitchen atorvastatin (LIPITOR) 40 MG tablet   Oral   Take 40 mg by mouth daily.         . Levomilnacipran HCl ER (FETZIMA) 40 MG CP24   Oral   Take 1 tablet by mouth daily.         Marland Kitchen omeprazole (PRILOSEC) 20 MG capsule   Oral   Take 20 mg by mouth daily as needed (acid reflux).         . traMADol (ULTRAM) 50 MG tablet   Oral   Take 50 mg by mouth 3 (three) times daily.         Marland Kitchen HYDROcodone-acetaminophen (NORCO/VICODIN) 5-325 MG per tablet   Oral   Take 1 tablet by mouth every 4 (four) hours as needed for pain.   10 tablet   0   . ibuprofen (ADVIL,MOTRIN) 200 MG tablet   Oral   Take 800 mg by mouth every 6 (six) hours as needed for pain.         Marland Kitchen  oxyCODONE-acetaminophen (PERCOCET/ROXICET) 5-325 MG per tablet   Oral   Take 1-2 tablets by mouth every 6 (six) hours as needed for pain.   15 tablet   0    BP 125/57  Pulse 94  Temp(Src) 98.3 F (36.8 C) (Oral)  Resp 20  Ht 5\' 6"  (1.676 m)  Wt 167 lb (75.751 kg)  BMI 26.97 kg/m2  SpO2 99% Physical Exam  Nursing note and vitals reviewed. Constitutional: She appears well-developed and well-nourished.  Awake, alert, nontoxic appearance.  HENT:  Head: Normocephalic and atraumatic.  Eyes: EOM are normal. Pupils are equal, round, and reactive to light.  Neck: Neck supple.  Cardiovascular: Normal rate and intact distal pulses.   Pulmonary/Chest: Effort normal. She has no wheezes. She exhibits tenderness.  coughing  Abdominal: Soft. There is no tenderness. There is no rebound.  Musculoskeletal: She exhibits no tenderness.  Baseline ROM, no obvious new focal weakness.  Neurological:  Mental status and motor strength appears baseline for patient and situation.  Skin: No rash noted.  Psychiatric: She has a normal mood and affect.     ED Course  Procedures (including critical care time) Labs Reviewed - No data to display Dg Chest 2 View  04/05/2013   *RADIOLOGY REPORT*  Clinical Data: Cough.  CHEST - 2 VIEW  Comparison: 02/28/2013.  Findings: Subsegmental atelectasis is present in the right lower lobe.  This is superimposed on chronic bronchitic changes.  No airspace disease/pneumonia.  Cardiopericardial silhouette appears within normal limits.  Mediastinal contours are normal.  IMPRESSION: Chronic bronchitic changes and subsegmental right lower lobe atelectasis.  Negative for pneumonia.   Original Report Authenticated By: Andreas Newport, M.D.   Medications  azithromycin (ZITHROMAX) tablet 500 mg (not administered)  HYDROcodone-acetaminophen (NORCO/VICODIN) 5-325 MG per tablet 1 tablet (not administered)  predniSONE (DELTASONE) tablet 60 mg (not administered)  albuterol (PROVENTIL) (5 MG/ML) 0.5% nebulizer solution 5 mg (5 mg Nebulization Given 04/05/13 0249)  ipratropium (ATROVENT) nebulizer solution 0.5 mg (0.5 mg Nebulization Given 04/05/13 0249)  guaiFENesin-codeine 100-10 MG/5ML solution 5 mL (5 mLs Oral Given 04/05/13 0246)     MDM  Patient with a cough for a week. Xray shows a bronchitis. Given prednisone, robitussin ac, albuterol.atrovent nebulizer treatment with some improvement. Reviewed results with the patient. Pt stable in ED with no significant deterioration in condition.The patient appears reasonably screened and/or stabilized for discharge and I doubt any other medical condition or other [facility] requiring further screening, evaluation, or treatment in the ED at this time prior to discharge.  MDM Reviewed: nursing note and vitals Interpretation: x-ray     Nicoletta Dress. Colon Branch, MD 04/05/13 [phone]

## 2013-04-05 NOTE — ED Notes (Signed)
Pt states cough for the past week, pt has been under a lot of additional stress over the past week also.

## 2013-06-05 ENCOUNTER — Emergency Department (HOSPITAL_COMMUNITY)
Admission: EM | Admit: 2013-06-05 | Discharge: 2013-06-06 | Disposition: A | Discharge status: Home / Self Care | Payer: Self-pay | Attending: Emergency Medicine | Admitting: Emergency Medicine

## 2013-06-05 ENCOUNTER — Encounter (HOSPITAL_COMMUNITY): Payer: Self-pay | Admitting: *Deleted

## 2013-06-05 DIAGNOSIS — Z79899 Other long term (current) drug therapy: Secondary | ICD-10-CM | POA: Insufficient documentation

## 2013-06-05 DIAGNOSIS — F411 Generalized anxiety disorder: Secondary | ICD-10-CM | POA: Insufficient documentation

## 2013-06-05 DIAGNOSIS — IMO0002 Reserved for concepts with insufficient information to code with codable children: Secondary | ICD-10-CM | POA: Insufficient documentation

## 2013-06-05 DIAGNOSIS — K5289 Other specified noninfective gastroenteritis and colitis: Secondary | ICD-10-CM | POA: Insufficient documentation

## 2013-06-05 DIAGNOSIS — Z8589 Personal history of malignant neoplasm of other organs and systems: Secondary | ICD-10-CM | POA: Insufficient documentation

## 2013-06-05 DIAGNOSIS — I2510 Atherosclerotic heart disease of native coronary artery without angina pectoris: Secondary | ICD-10-CM | POA: Insufficient documentation

## 2013-06-05 DIAGNOSIS — E7800 Pure hypercholesterolemia, unspecified: Secondary | ICD-10-CM | POA: Insufficient documentation

## 2013-06-05 DIAGNOSIS — R6883 Chills (without fever): Secondary | ICD-10-CM | POA: Insufficient documentation

## 2013-06-05 DIAGNOSIS — R197 Diarrhea, unspecified: Secondary | ICD-10-CM | POA: Insufficient documentation

## 2013-06-05 DIAGNOSIS — F17200 Nicotine dependence, unspecified, uncomplicated: Secondary | ICD-10-CM | POA: Insufficient documentation

## 2013-06-05 DIAGNOSIS — M545 Low back pain, unspecified: Secondary | ICD-10-CM | POA: Insufficient documentation

## 2013-06-05 DIAGNOSIS — R110 Nausea: Secondary | ICD-10-CM | POA: Insufficient documentation

## 2013-06-05 DIAGNOSIS — K529 Noninfective gastroenteritis and colitis, unspecified: Secondary | ICD-10-CM

## 2013-06-05 DIAGNOSIS — M129 Arthropathy, unspecified: Secondary | ICD-10-CM | POA: Insufficient documentation

## 2013-06-05 DIAGNOSIS — Z7982 Long term (current) use of aspirin: Secondary | ICD-10-CM | POA: Insufficient documentation

## 2013-06-05 LAB — CBC WITH DIFFERENTIAL/PLATELET
Basophils Absolute: 0 10*3/uL (ref 0.0–0.1)
Basophils Relative: 0 % (ref 0–1)
Hemoglobin: 12.9 g/dL (ref 12.0–15.0)
Lymphocytes Relative: 12 % (ref 12–46)
MCHC: 33.6 g/dL (ref 30.0–36.0)
Monocytes Relative: 9 % (ref 3–12)
Neutro Abs: 9.5 10*3/uL — ABNORMAL HIGH (ref 1.7–7.7)
Neutrophils Relative %: 79 % — ABNORMAL HIGH (ref 43–77)
WBC: 12 10*3/uL — ABNORMAL HIGH (ref 4.0–10.5)

## 2013-06-05 LAB — URINALYSIS, ROUTINE W REFLEX MICROSCOPIC
Glucose, UA: NEGATIVE mg/dL
Leukocytes, UA: NEGATIVE
Protein, ur: NEGATIVE mg/dL
Specific Gravity, Urine: >1.03 — ABNORMAL HIGH (ref 1.005–1.030)
pH: 6 (ref 5.0–8.0)

## 2013-06-05 LAB — URINE MICROSCOPIC-ADD ON

## 2013-06-05 LAB — COMPREHENSIVE METABOLIC PANEL
AST: 20 U/L (ref 0–37)
Albumin: 3.8 g/dL (ref 3.5–5.2)
Alkaline Phosphatase: 74 U/L (ref 39–117)
Chloride: 99 mEq/L (ref 96–112)
Potassium: 3.6 mEq/L (ref 3.5–5.1)
Total Bilirubin: <0.1 mg/dL — ABNORMAL LOW (ref 0.3–1.2)

## 2013-06-05 MED ORDER — ONDANSETRON HCL 4 MG/2ML IJ SOLN
4.0000 mg | Freq: Once | INTRAMUSCULAR | Status: AC
  Administered 2013-06-05: 4 mg via INTRAVENOUS
  Filled 2013-06-05: qty 2

## 2013-06-05 MED ORDER — CIPROFLOXACIN HCL 500 MG PO TABS: 500.0000 mg | ORAL_TABLET | Freq: Two times a day (BID) | ORAL | Status: DC

## 2013-06-05 MED ORDER — METRONIDAZOLE 500 MG PO TABS: 500.0000 mg | ORAL_TABLET | Freq: Two times a day (BID) | ORAL | Status: DC

## 2013-06-05 MED ORDER — HYDROMORPHONE HCL PF 1 MG/ML IJ SOLN
0.5000 mg | Freq: Once | INTRAMUSCULAR | Status: AC
  Administered 2013-06-05: 0.5 mg via INTRAVENOUS
  Filled 2013-06-05: qty 1

## 2013-06-05 MED ORDER — SODIUM CHLORIDE 0.9 % IV SOLN
INTRAVENOUS | Status: DC
  Administered 2013-06-05: 125 mL/h via INTRAVENOUS

## 2013-06-05 NOTE — ED Provider Notes (Signed)
CSN: [id number]     Arrival date & time 06/05/13  2147 History   First MD Initiated Contact with Patient 06/05/13 2158     Chief Complaint  Patient presents with  . Abdominal Pain   (Consider location/radiation/quality/duration/timing/severity/associated sxs/prior Treatment) Patient is a 57 y.o. female presenting with abdominal pain. The history is provided by the patient.  Abdominal Pain Pain location:  LLQ and RLQ Pain quality: cramping and sharp   Pain radiation: back. Pain severity now: 8/10. Onset quality:  Sudden Duration:  5 hours Timing:  Constant Progression:  Worsening Chronicity:  New Associated symptoms: chills, diarrhea and nausea   Associated symptoms: no chest pain, no cough, no dysuria, no fever, no shortness of breath, no sore throat, no vaginal bleeding, no vaginal discharge and no vomiting    Sandra Conrad is a 57 y.o. female who presents to the ED with abdominal pain that started today approximately 5:30. She denies UTI symptoms. Patient states she took ASA for a headache earlier today and the pain started after that.    Past Medical History  Diagnosis Date  . Hypercholesteremia   . Coronary artery disease   . Cancer   . Arthritis   . Anxiety    Past Surgical History  Procedure Laterality Date  . Cardiac catheterization    . Breast lumpectomy    . Hip surgery    . Wrist surgery    . Dilation and curettage of uterus     History reviewed. No pertinent family history. History  Substance Use Topics  . Smoking status: Current Every Day Smoker  . Smokeless tobacco: Not on file  . Alcohol Use: No   OB History   Grav Para Term Preterm Abortions TAB SAB Ect Mult Living                 Review of Systems  Constitutional: Positive for chills. Negative for fever.  HENT: Negative for ear pain, sore throat and neck pain.   Eyes: Negative for visual disturbance.  Respiratory: Negative for cough and shortness of breath.   Cardiovascular: Negative for chest  pain.  Gastrointestinal: Positive for nausea, abdominal pain and diarrhea. Negative for vomiting.  Genitourinary: Negative for dysuria, urgency, frequency, vaginal bleeding and vaginal discharge.  Musculoskeletal: Positive for back pain.  Skin: Negative for rash.  Neurological: Negative for syncope and headaches.  Psychiatric/Behavioral: The patient is not nervous/anxious.     Allergies  Review of patient's allergies indicates no known allergies.  Home Medications   Current Outpatient Rx  Name  Route  Sig  Dispense  Refill  . albuterol (PROVENTIL HFA;VENTOLIN HFA) 108 (90 BASE) MCG/ACT inhaler   Inhalation   Inhale 1-2 puffs into the lungs every 6 (six) hours as needed for wheezing.   1 Inhaler   0   . ALPRAZolam (XANAX) 1 MG tablet   Oral   Take 1 mg by mouth at bedtime as needed for sleep.         Marland Kitchen aspirin 325 MG tablet   Oral   Take 325 mg by mouth daily.         Marland Kitchen atorvastatin (LIPITOR) 40 MG tablet   Oral   Take 40 mg by mouth daily.         Marland Kitchen azithromycin (ZITHROMAX) 250 MG tablet      1 every day until finished.   4 tablet   0   . guaiFENesin-codeine (ROBITUSSIN AC) 100-10 MG/5ML syrup   Oral   Take  5 mLs by mouth 3 (three) times daily as needed for cough.   120 mL   0   . HYDROcodone-acetaminophen (NORCO/VICODIN) 5-325 MG per tablet   Oral   Take 1 tablet by mouth every 4 (four) hours as needed for pain.   10 tablet   0   . HYDROcodone-acetaminophen (NORCO/VICODIN) 5-325 MG per tablet   Oral   Take 1 tablet by mouth every 4 (four) hours as needed for pain.   15 tablet   0   . ibuprofen (ADVIL,MOTRIN) 200 MG tablet   Oral   Take 800 mg by mouth every 6 (six) hours as needed for pain.         . Levomilnacipran HCl ER (FETZIMA) 40 MG CP24   Oral   Take 1 tablet by mouth daily.         Marland Kitchen omeprazole (PRILOSEC) 20 MG capsule   Oral   Take 20 mg by mouth daily as needed (acid reflux).         . ondansetron (ZOFRAN ODT) 4 MG  disintegrating tablet   Oral   Take 1 tablet (4 mg total) by mouth every 8 (eight) hours as needed for nausea.   20 tablet   0   . oxyCODONE-acetaminophen (PERCOCET/ROXICET) 5-325 MG per tablet   Oral   Take 1-2 tablets by mouth every 6 (six) hours as needed for pain.   15 tablet   0   . predniSONE (DELTASONE) 10 MG tablet   Oral   Take 2 tablets (20 mg total) by mouth daily.   10 tablet   0   . traMADol (ULTRAM) 50 MG tablet   Oral   Take 50 mg by mouth 3 (three) times daily.          BP 112/63  Pulse 84  Temp(Src) 97.5 F (36.4 C)  Resp 20  Ht 5\' 5"  (1.651 m)  Wt 167 lb (75.751 kg)  BMI 27.79 kg/m2  SpO2 99% Physical Exam  Nursing note and vitals reviewed. Constitutional: She is oriented to person, place, and time. She appears well-developed and well-nourished. No distress.  HENT:  Head: Normocephalic and atraumatic.  Eyes: Conjunctivae and EOM are normal. Pupils are equal, round, and reactive to light.  Neck: Neck supple.  Cardiovascular: Normal rate and normal heart sounds.   Pulmonary/Chest: Effort normal and breath sounds normal.  Abdominal: Soft. She exhibits no abdominal bruit and no ascites. Bowel sounds are increased. There is tenderness in the right lower quadrant, suprapubic area and left lower quadrant. There is no rebound, no guarding and no CVA tenderness.  Genitourinary: Rectal exam shows no external hemorrhoid, no internal hemorrhoid, no fissure, no mass, no tenderness and anal tone normal. Guaiac negative stool.  Musculoskeletal: Normal range of motion.  Neurological: She is alert and oriented to person, place, and time. No cranial nerve deficit.  Skin: Skin is warm and dry.  Psychiatric: She has a normal mood and affect. Her behavior is normal.   Results for orders placed during the hospital encounter of 06/05/13 (from the past 24 hour(s))  URINALYSIS, ROUTINE W REFLEX MICROSCOPIC     Status: Abnormal   Collection Time    06/05/13 10:01 PM       Result Value Range   Color, Urine YELLOW  YELLOW   APPearance CLEAR  CLEAR   Specific Gravity, Urine >1.030 (*) 1.005 - 1.030   pH 6.0  5.0 - 8.0   Glucose, UA NEGATIVE  NEGATIVE mg/dL  Hgb urine dipstick TRACE (*) NEGATIVE   Bilirubin Urine NEGATIVE  NEGATIVE   Ketones, ur NEGATIVE  NEGATIVE mg/dL   Protein, ur NEGATIVE  NEGATIVE mg/dL   Urobilinogen, UA 0.2  0.0 - 1.0 mg/dL   Nitrite NEGATIVE  NEGATIVE   Leukocytes, UA NEGATIVE  NEGATIVE  URINE MICROSCOPIC-ADD ON     Status: None   Collection Time    06/05/13 10:01 PM      Result Value Range   RBC / HPF 0-2  <3 RBC/hpf  CBC WITH DIFFERENTIAL     Status: Abnormal   Collection Time    06/05/13 10:45 PM      Result Value Range   WBC 12.0 (*) 4.0 - 10.5 K/uL   RBC 3.93  3.87 - 5.11 MIL/uL   Hemoglobin 12.9  12.0 - 15.0 g/dL   HCT 16.1  09.6 - 04.5 %   MCV 97.7  78.0 - 100.0 fL   MCH 32.8  26.0 - 34.0 pg   MCHC 33.6  30.0 - 36.0 g/dL   RDW 40.9  81.1 - 91.4 %   Platelets 344  150 - 400 K/uL   Neutrophils Relative % 79 (*) 43 - 77 %   Neutro Abs 9.5 (*) 1.7 - 7.7 K/uL   Lymphocytes Relative 12  12 - 46 %   Lymphs Abs 1.4  0.7 - 4.0 K/uL   Monocytes Relative 9  3 - 12 %   Monocytes Absolute 1.0  0.1 - 1.0 K/uL   Eosinophils Relative 1  0 - 5 %   Eosinophils Absolute 0.1  0.0 - 0.7 K/uL   Basophils Relative 0  0 - 1 %   Basophils Absolute 0.0  0.0 - 0.1 K/uL  COMPREHENSIVE METABOLIC PANEL     Status: Abnormal   Collection Time    06/05/13 10:45 PM      Result Value Range   Sodium 135  135 - 145 mEq/L   Potassium 3.6  3.5 - 5.1 mEq/L   Chloride 99  96 - 112 mEq/L   CO2 26  19 - 32 mEq/L   Glucose, Bld 107 (*) 70 - 99 mg/dL   BUN 15  6 - 23 mg/dL   Creatinine, Ser 7.82  0.50 - 1.10 mg/dL   Calcium 9.4  8.4 - 95.6 mg/dL   Total Protein 6.8  6.0 - 8.3 g/dL   Albumin 3.8  3.5 - 5.2 g/dL   AST 20  0 - 37 U/L   ALT 24  0 - 35 U/L   Alkaline Phosphatase 74  39 - 117 U/L   Total Bilirubin <0.1 (*) 0.3 - 1.2 mg/dL   GFR  calc non Af Amer >90  >90 mL/min   GFR calc Af Amer >90  >90 mL/min    ED Course  Procedures  Re examined. Patient feeling much better after zofran and IV hydration. Abdomen soft, no rebound or guarding. Minimal pain lower abdomen.  MDM: Dr. Hyacinth Meeker in to examine the patient and agrees with assessment. Will plan to discharge patient home with antibiotics and follow up with PCP.  57 y.o. female with acute onset of lower abdominal pain with nausea and diarrhea. I have reviewed this patient's vital signs, nurses notes, appropriate labs and discussed findings with the patient and plan of care. Patient voices understanding.  Patient stable for discharge home without any immediate complications. Vital signs normal.   Medication List    TAKE these medications  ciprofloxacin 500 MG tablet  Commonly known as:  CIPRO  Take 1 tablet (500 mg total) by mouth every 12 (twelve) hours.     metroNIDAZOLE 500 MG tablet  Commonly known as:  FLAGYL  Take 1 tablet (500 mg total) by mouth 2 (two) times daily.      ASK your doctor about these medications       aspirin 325 MG tablet  Take 325 mg by mouth daily.     atorvastatin 40 MG tablet  Commonly known as:  LIPITOR  Take 40 mg by mouth daily.     ibuprofen 200 MG tablet  Commonly known as:  ADVIL,MOTRIN  Take 800 mg by mouth every 6 (six) hours as needed for pain.     omeprazole 20 MG capsule  Commonly known as:  PRILOSEC  Take 20 mg by mouth daily as needed (acid reflux).             [facility] Orlene Och, [place] 06/06/13 [phone]

## 2013-06-05 NOTE — ED Provider Notes (Signed)
Medical screening examination/treatment/procedure(s) were conducted as a shared visit with non-physician practitioner(s) and myself.  I personally evaluated the patient during the encounter.  Acute onset of abd pain - diffuse, mild, felt abd as hard as a rock, gradually improving - associated diarrhea - on my exam is very soft adn non peritonteal, no guarding, mild diffuse lower abd ttp.  Hyperactive BS in all 4 quadrants. No specific focal pain at [place] point, no RUQ ttp, clear heart and lungs, no prior surgical hx.  Fluids, labs abx, avoid imaging at this time.  Clinical Impression: abd pain, diarrhea, possible colitis,        Vida Roller, MD 06/05/13 2326

## 2013-06-05 NOTE — ED Notes (Addendum)
Pt c/o lower abdominal pain since 8:30 tonight. Denies any urinary symptoms.

## 2013-06-06 NOTE — ED Provider Notes (Signed)
Medical screening examination/treatment/procedure(s) were conducted as a shared visit with non-physician practitioner(s) and myself.  I personally evaluated the patient during the encounter  Please see my separate respective documentation pertaining to this patient encounter   Vida Roller, MD 06/06/13 [phone]

## 2014-03-14 ENCOUNTER — Other Ambulatory Visit: Payer: Self-pay | Admitting: Internal Medicine

## 2014-03-14 DIAGNOSIS — Z853 Personal history of malignant neoplasm of breast: Secondary | ICD-10-CM

## 2014-04-18 ENCOUNTER — Ambulatory Visit
Admission: RE | Admit: 2014-04-18 | Discharge: 2014-04-18 | Disposition: A | Discharge status: Home / Self Care | Payer: 59 | Source: Ambulatory Visit | Attending: Internal Medicine | Admitting: Internal Medicine

## 2014-04-18 DIAGNOSIS — Z853 Personal history of malignant neoplasm of breast: Secondary | ICD-10-CM

## 2014-10-17 ENCOUNTER — Encounter: Payer: Self-pay | Admitting: Internal Medicine

## 2014-10-22 ENCOUNTER — Emergency Department (HOSPITAL_COMMUNITY)
Admission: EM | Admit: 2014-10-22 | Discharge: 2014-10-22 | Disposition: A | Discharge status: Home / Self Care | Payer: 59 | Attending: Emergency Medicine | Admitting: Emergency Medicine

## 2014-10-22 ENCOUNTER — Encounter (HOSPITAL_COMMUNITY): Payer: Self-pay | Admitting: Emergency Medicine

## 2014-10-22 ENCOUNTER — Emergency Department (HOSPITAL_COMMUNITY): Payer: 59

## 2014-10-22 DIAGNOSIS — Z859 Personal history of malignant neoplasm, unspecified: Secondary | ICD-10-CM | POA: Insufficient documentation

## 2014-10-22 DIAGNOSIS — G43919 Migraine, unspecified, intractable, without status migrainosus: Secondary | ICD-10-CM | POA: Diagnosis not present

## 2014-10-22 DIAGNOSIS — H748X3 Other specified disorders of middle ear and mastoid, bilateral: Secondary | ICD-10-CM | POA: Diagnosis not present

## 2014-10-22 DIAGNOSIS — R079 Chest pain, unspecified: Secondary | ICD-10-CM | POA: Insufficient documentation

## 2014-10-22 DIAGNOSIS — J321 Chronic frontal sinusitis: Secondary | ICD-10-CM | POA: Diagnosis not present

## 2014-10-22 DIAGNOSIS — Z720 Tobacco use: Secondary | ICD-10-CM | POA: Diagnosis not present

## 2014-10-22 DIAGNOSIS — I2510 Atherosclerotic heart disease of native coronary artery without angina pectoris: Secondary | ICD-10-CM | POA: Insufficient documentation

## 2014-10-22 DIAGNOSIS — Z9889 Other specified postprocedural states: Secondary | ICD-10-CM | POA: Insufficient documentation

## 2014-10-22 DIAGNOSIS — F419 Anxiety disorder, unspecified: Secondary | ICD-10-CM | POA: Insufficient documentation

## 2014-10-22 DIAGNOSIS — Z79899 Other long term (current) drug therapy: Secondary | ICD-10-CM | POA: Insufficient documentation

## 2014-10-22 DIAGNOSIS — E780 Pure hypercholesterolemia: Secondary | ICD-10-CM | POA: Diagnosis not present

## 2014-10-22 DIAGNOSIS — M1990 Unspecified osteoarthritis, unspecified site: Secondary | ICD-10-CM | POA: Diagnosis not present

## 2014-10-22 DIAGNOSIS — R0602 Shortness of breath: Secondary | ICD-10-CM | POA: Diagnosis present

## 2014-10-22 MED ORDER — DEXAMETHASONE SODIUM PHOSPHATE 10 MG/ML IJ SOLN
10.0000 mg | Freq: Once | INTRAMUSCULAR | Status: AC
  Administered 2014-10-22: 10 mg via INTRAVENOUS
  Filled 2014-10-22: qty 1

## 2014-10-22 MED ORDER — LEVOFLOXACIN 750 MG PO TABS: 750.0000 mg | ORAL_TABLET | Freq: Every day | ORAL | Status: DC

## 2014-10-22 MED ORDER — HYDROCODONE-HOMATROPINE 5-1.5 MG/5ML PO SYRP: 5.0000 mL | ORAL_SOLUTION | Freq: Four times a day (QID) | ORAL | Status: DC | PRN

## 2014-10-22 MED ORDER — METOCLOPRAMIDE HCL 5 MG/ML IJ SOLN
10.0000 mg | Freq: Once | INTRAMUSCULAR | Status: AC
  Administered 2014-10-22: 10 mg via INTRAVENOUS
  Filled 2014-10-22: qty 2

## 2014-10-22 MED ORDER — DIPHENHYDRAMINE HCL 50 MG/ML IJ SOLN
25.0000 mg | Freq: Once | INTRAMUSCULAR | Status: AC
  Administered 2014-10-22: 25 mg via INTRAVENOUS
  Filled 2014-10-22: qty 1

## 2014-10-22 MED ORDER — SODIUM CHLORIDE 0.9 % IV BOLUS (SEPSIS)
1000.0000 mL | Freq: Once | INTRAVENOUS | Status: AC
  Administered 2014-10-22: 1000 mL via INTRAVENOUS

## 2014-10-22 NOTE — ED Notes (Signed)
Pt from home c/o body aches, cough, shortness of breath, and headache. She has been battling ear infections and bronchitis since November.

## 2014-10-22 NOTE — ED Provider Notes (Addendum)
CSN: [id number]     Arrival date & time 10/22/14  1859 History   First MD Initiated Contact with Patient 10/22/14 2037     Chief Complaint  Patient presents with  . Shortness of Breath  . Cough     (Consider location/radiation/quality/duration/timing/severity/associated sxs/prior Treatment) Patient is a 59 y.o. female presenting with cough and URI. The history is provided by the patient.  Cough Associated symptoms: fever, headaches and rhinorrhea   Associated symptoms: no wheezing   URI Presenting symptoms: congestion, cough, facial pain, fever and rhinorrhea   Congestion:    Location:  Nasal and chest   Interferes with sleep: yes     Interferes with eating/drinking: no   Cough:    Cough characteristics:  Non-productive and hacking   Severity:  Moderate   Onset quality:  Gradual   Duration:  2 months   Timing:  Intermittent   Progression:  Waxing and waning   Chronicity:  New Fever:    Duration:  1 day   Timing:  Intermittent   Max temp PTA (F):  101.5   Temp source:  Oral   Progression:  Waxing and waning Severity:  Severe Chronicity:  Recurrent Associated symptoms: headaches and sinus pain   Associated symptoms: no neck pain and no wheezing   Headaches:    Severity:  Moderate   Onset quality:  Gradual   Timing:  Constant   Progression:  Worsening   Chronicity:  New Risk factors: no chronic kidney disease, no chronic respiratory disease, no immunosuppression and no sick contacts     Past Medical History  Diagnosis Date  . Hypercholesteremia   . Coronary artery disease   . Cancer   . Arthritis   . Anxiety    Past Surgical History  Procedure Laterality Date  . Cardiac catheterization    . Breast lumpectomy    . Hip surgery    . Wrist surgery    . Dilation and curettage of uterus     No family history on file. History  Substance Use Topics  . Smoking status: Current Every Day Smoker  . Smokeless tobacco: Not on file  . Alcohol Use: No   OB History     No data available     Review of Systems  Constitutional: Positive for fever.  HENT: Positive for congestion and rhinorrhea.   Respiratory: Positive for cough. Negative for wheezing.   Musculoskeletal: Negative for neck pain.  Neurological: Positive for headaches.  All other systems reviewed and are negative.     Allergies  Cefdinir  Home Medications   Prior to Admission medications   Medication Sig Start Date End Date Taking? Authorizing Provider  acetaminophen (TYLENOL) 500 MG tablet Take 1,000 mg by mouth every 6 (six) hours as needed for moderate pain.   Yes Historical Provider, MD  atorvastatin (LIPITOR) 40 MG tablet Take 40 mg by mouth daily.   Yes Historical Provider, MD  butalbital-acetaminophen-caffeine (FIORICET WITH CODEINE) 50-325-40-30 MG per capsule Take 1 capsule by mouth every 6 (six) hours as needed for headache or migraine.  09/20/14  Yes Historical Provider, MD  clonazePAM (KLONOPIN) 1 MG tablet Take 1 mg by mouth 2 (two) times daily as needed for anxiety.  10/05/14  Yes Historical Provider, MD  ibuprofen (ADVIL,MOTRIN) 200 MG tablet Take 800 mg by mouth every 6 (six) hours as needed for pain.   Yes Historical Provider, MD  Lactobacillus (PROBIOTIC ACIDOPHILUS PO) Take 1 capsule by mouth daily.   Yes Historical  Provider, MD  sertraline (ZOLOFT) 50 MG tablet Take 50 mg by mouth daily.   Yes Historical Provider, MD  traMADol (ULTRAM) 50 MG tablet Take 50 mg by mouth every 6 (six) hours as needed for moderate pain.   Yes Historical Provider, MD  ciprofloxacin (CIPRO) 500 MG tablet Take 1 tablet (500 mg total) by mouth every 12 (twelve) hours. Patient not taking: Reported on 10/22/2014 06/05/13   [facility] Bunnie Pion, NP  metroNIDAZOLE (FLAGYL) 500 MG tablet Take 1 tablet (500 mg total) by mouth 2 (two) times daily. Patient not taking: Reported on 10/22/2014 06/05/13   [facility] Bunnie Pion, NP   BP 125/76 mmHg  Pulse 116  Temp(Src) 98.4 F (36.9 C) (Oral)  Resp 22  SpO2  97% Physical Exam  Constitutional: She is oriented to person, place, and time. She appears well-developed and well-nourished. She appears distressed.  HENT:  Head: Normocephalic and atraumatic.  Right Ear: Ear canal normal. A middle ear effusion is present.  Left Ear: Ear canal normal. A middle ear effusion is present.  Nose: Mucosal edema present. Right sinus exhibits maxillary sinus tenderness and frontal sinus tenderness. Left sinus exhibits maxillary sinus tenderness and frontal sinus tenderness.  Eyes: EOM are normal. Pupils are equal, round, and reactive to light.  Fundoscopic exam:      The right eye shows no papilledema.       The left eye shows no papilledema.  Neck: Normal range of motion. Neck supple.  Cardiovascular: Normal rate, regular rhythm, normal heart sounds and intact distal pulses.  Exam reveals no friction rub.   No murmur heard. Pulmonary/Chest: Effort normal and breath sounds normal. She has no wheezes. She has no rales. She exhibits tenderness.  Abdominal: Soft. Bowel sounds are normal. She exhibits no distension. There is no tenderness. There is no rebound and no guarding.  Musculoskeletal: Normal range of motion. She exhibits no tenderness.  No edema  Lymphadenopathy:    She has no cervical adenopathy.  Neurological: She is alert and oriented to person, place, and time. She has normal strength. No cranial nerve deficit or sensory deficit. Gait normal.  photophobia  Skin: Skin is warm and dry. No rash noted.  Psychiatric: She has a normal mood and affect. Her behavior is normal.  Nursing note and vitals reviewed.   ED Course  Procedures (including critical care time) Labs Review Labs Reviewed - No data to display  Imaging Review Dg Chest 2 View (if Patient Has Fever And/or Copd)  10/22/2014   CLINICAL DATA:  Two month history of intermittent cough, shortness of breath, headache and body aches. Current smoker. Current history of coronary artery disease.  Personal history of breast cancer.  EXAM: CHEST  2 VIEW  COMPARISON:  04/05/2013, 02/28/2013.  FINDINGS: Cardiomediastinal silhouette unremarkable, unchanged. Emphysematous changes throughout both lungs. Moderate central peribronchial thickening, unchanged. Lungs otherwise clear. No localized airspace consolidation. No pleural effusions. No pneumothorax. Normal pulmonary vascularity. Mild degenerative changes involving the thoracic spine. No significant interval change.  IMPRESSION: COPD/emphysema. No acute cardiopulmonary disease. Stable examination.   Electronically Signed   By: Evangeline Dakin M.D.   On: 10/22/2014 20:14     EKG Interpretation None      MDM   Final diagnoses:  Intractable migraine without status migrainosus, unspecified migraine type  Sinusitis chronic, frontal    For the last 2 months patient has had intermittent sinus pain and pressure. She took a course of Omnicef and Cipro last course was approximately 1 month  ago with initial resolution of symptoms and then return. For the last 1 week patient has had worsening facial pain, migraine headaches, postnasal drip, worsening cough and today had a fever of 101.5. She does smoke but has never required inhalers in the past.  Breath sounds are clear bilaterally without wheezing here. She does not appear to be short of breath and is not tachypneic. He does have bilateral facial pain as well is middle ear effusions bilaterally without signs of otitis. Feel most likely the patient has sinusitis and now with fever feel that she will need antibiotics at this time. She also has follow-up with your nose and throat on February 2.  Here patient given headache cocktail.  We'll discharge home on Levaquin given she is very had Cipro and Omnicef  10:36 PM hA improved.  Will d/c home.  Blanchie Dessert, MD 10/22/14 0998  Blanchie Dessert, MD 10/22/14 2239

## 2014-10-22 NOTE — Discharge Instructions (Signed)

## 2014-10-22 NOTE — ED Notes (Signed)
Patient stated she was not able to use the restroom at this time but would try at a later time.

## 2014-11-24 ENCOUNTER — Encounter: Payer: Self-pay | Admitting: *Deleted

## 2014-12-06 ENCOUNTER — Ambulatory Visit (INDEPENDENT_AMBULATORY_CARE_PROVIDER_SITE_OTHER): Payer: 59 | Admitting: Internal Medicine

## 2014-12-06 ENCOUNTER — Encounter: Payer: Self-pay | Admitting: Internal Medicine

## 2014-12-06 VITALS — BP 112/68 | HR 72 | Ht 66.0 in | Wt 171.4 lb

## 2014-12-06 DIAGNOSIS — Z1211 Encounter for screening for malignant neoplasm of colon: Secondary | ICD-10-CM

## 2014-12-06 DIAGNOSIS — Z8719 Personal history of other diseases of the digestive system: Secondary | ICD-10-CM

## 2014-12-06 DIAGNOSIS — R197 Diarrhea, unspecified: Secondary | ICD-10-CM | POA: Insufficient documentation

## 2014-12-06 DIAGNOSIS — Z800 Family history of malignant neoplasm of digestive organs: Secondary | ICD-10-CM

## 2014-12-06 DIAGNOSIS — R109 Unspecified abdominal pain: Secondary | ICD-10-CM | POA: Insufficient documentation

## 2014-12-06 MED ORDER — METOCLOPRAMIDE HCL 10 MG PO TABS: 10.0000 mg | ORAL_TABLET | ORAL | Status: DC

## 2014-12-06 MED ORDER — MOVIPREP 100 G PO SOLR: 1.0000 | Freq: Once | ORAL | Status: DC

## 2014-12-06 NOTE — Patient Instructions (Addendum)
You have been scheduled for a colonoscopy. Please follow written instructions given to you at your visit today.  Please pick up your prep supplies at the pharmacy within the next 1-3 days. If you use inhalers (even only as needed), please bring them with you on the day of your procedure. Your physician has requested that you go to www.startemmi.com and enter the access code given to you at your visit today. This web site gives a general overview about your procedure. However, you should still follow specific instructions given to you by our office regarding your preparation for the procedure.  We have sent the following medications to your pharmacy for you to pick up at your convenience: Reglan-2 tablets- take as directed for colonoscopy prep  CC:Dr Delphina Cahill

## 2014-12-07 NOTE — Progress Notes (Signed)
Patient ID: Sandra Conrad, female   DOB: 1956-05-12, 59 y.o.   MRN: [id number] HPI: Sandra Conrad is a 59 yo female with PMH of CAD, hypercholesterolemia, osteoporosis and arthritis was seen in consultation at the request of Dr. Wende Neighbors to evaluate history of diverticulitis and also to discuss repeat colonoscopy. She is here alone today. She reports that about 18 months ago she developed severe left lower quadrant abdominal pain and was treated with ciprofloxacin and metronidazole for presumed diverticulitis. This resolved the pain. She's had intermittent issues with diarrhea over the last several weeks to months but has been treated with multiple rounds of antibiotics for sinusitis and upper respiratory infections. Today she denies abdominal pain. She reports that she is eating well though she only eats twice daily. She denies heartburn, dysphagia or odynophagia. She has no nausea or vomiting. 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 She denies rectal bleeding and melena. She reports a normal colonoscopy performed around September 2007 in [place]. Her mother had a history of colorectal cancer at age 42 and she reports that she has a sister and brother both who have had colon polyps.  Past Medical History  Diagnosis Date  . Hypercholesteremia   . Coronary artery disease   . Cancer   . Arthritis   . Anxiety   . Osteoporosis   . Insomnia   . Abdominal pain   . Diarrhea     Past Surgical History  Procedure Laterality Date  . Cardiac catheterization    . Breast lumpectomy    . Hip surgery    . Wrist surgery    . Dilation and curettage of uterus      Outpatient Prescriptions Prior to Visit  Medication Sig Dispense Refill  . acetaminophen (TYLENOL) 500 MG tablet Take 1,000 mg by mouth every 6 (six) hours as needed for moderate pain.    Marland Kitchen atorvastatin (LIPITOR) 40 MG tablet Take 40 mg by mouth  daily.    . clonazePAM (KLONOPIN) 1 MG tablet Take 1 mg by mouth 2 (two) times daily as needed for anxiety.   0  . Lactobacillus (PROBIOTIC ACIDOPHILUS PO) Take 1 capsule by mouth daily.    . sertraline (ZOLOFT) 50 MG tablet Take 50 mg by mouth daily.    . traMADol (ULTRAM) 50 MG tablet Take 50 mg by mouth every 6 (six) hours as needed for moderate pain.    . butalbital-acetaminophen-caffeine (FIORICET WITH CODEINE) 50-325-40-30 MG per capsule Take 1 capsule by mouth every 6 (six) hours as needed for headache or migraine.   0  . ciprofloxacin (CIPRO) 500 MG tablet Take 1 tablet (500 mg total) by mouth every 12 (twelve) hours. (Patient not taking: Reported on 10/22/2014) 20 tablet 0  . HYDROcodone-homatropine (HYCODAN) 5-1.5 MG/5ML syrup Take 5 mLs by mouth every 6 (six) hours as needed for cough. (Patient not taking: Reported on 12/06/2014) 120 mL 0  . ibuprofen (ADVIL,MOTRIN) 200 MG tablet Take 800 mg by mouth every 6 (six) hours as needed for pain.    Marland Kitchen levofloxacin (LEVAQUIN) 750 MG tablet Take 1 tablet (750 mg total) by mouth daily. (Patient not taking: Reported on 12/06/2014) 10 tablet 0  . metroNIDAZOLE (FLAGYL) 500 MG tablet Take 1 tablet (500 mg total) by mouth 2 (two) times daily. (Patient not taking: Reported on 10/22/2014) 20 tablet 0   No facility-administered medications prior to visit.  Allergies  Allergen Reactions  . Cefdinir Nausea Only and Other (See Comments)    Abdominal pain     Family History  Problem Relation Age of Onset  . Colon cancer Mother   . Heart attack Father     History  Substance Use Topics  . Smoking status: Current Every Day Smoker  . Smokeless tobacco: Not on file  . Alcohol Use: No    ROS: As per history of present illness, otherwise negative  BP 112/68 mmHg  Pulse 72  Ht 5\' 6"  (1.676 m)  Wt 171 lb 6.4 oz (77.747 kg)  BMI 27.68 kg/m2 Constitutional: Well-developed and well-nourished. No distress. HEENT: Normocephalic and atraumatic.  Oropharynx is clear and moist. No oropharyngeal exudate. Conjunctivae are normal.  No scleral icterus. Neck: Neck supple. Trachea midline. Cardiovascular: Normal rate, regular rhythm and intact distal pulses. No M/R/G Pulmonary/chest: Effort normal and breath sounds normal. No wheezing, rales or rhonchi. Abdominal: Soft, nontender, nondistended. Bowel sounds active throughout. There are no masses palpable. No hepatosplenomegaly. Extremities: no clubbing, cyanosis, or edema Lymphadenopathy: No cervical adenopathy noted. Neurological: Alert and oriented to person place and time. Skin: Skin is warm and dry. No rashes noted. Psychiatric: Normal mood and affect. Behavior is normal.  RELEVANT LABS AND IMAGING: CBC    Component Value Date/Time   WBC 12.0* 06/05/2013 2245   RBC 3.93 06/05/2013 2245   HGB 12.9 06/05/2013 2245   HCT 38.4 06/05/2013 2245   PLT 344 06/05/2013 2245   MCV 97.7 06/05/2013 2245   MCH 32.8 06/05/2013 2245   MCHC 33.6 06/05/2013 2245   RDW 13.7 06/05/2013 2245   LYMPHSABS 1.4 06/05/2013 2245   MONOABS 1.0 06/05/2013 2245   EOSABS 0.1 06/05/2013 2245   BASOSABS 0.0 06/05/2013 2245    CMP     Component Value Date/Time   NA 135 06/05/2013 2245   K 3.6 06/05/2013 2245   CL 99 06/05/2013 2245   CO2 26 06/05/2013 2245   GLUCOSE 107* 06/05/2013 2245   BUN 15 06/05/2013 2245   CREATININE 0.64 06/05/2013 2245   CALCIUM 9.4 06/05/2013 2245   PROT 6.8 06/05/2013 2245   ALBUMIN 3.8 06/05/2013 2245   AST 20 06/05/2013 2245   ALT 24 06/05/2013 2245   ALKPHOS 74 06/05/2013 2245   BILITOT <0.1* 06/05/2013 2245   GFRNONAA >90 06/05/2013 2245   GFRAA >90 06/05/2013 2245    ASSESSMENT/PLAN: 59 yo female with PMH of CAD, hypercholesterolemia, osteoporosis and arthritis was seen in consultation at the request of Dr. Wende Neighbors to evaluate history of diverticulitis and also to discuss repeat colonoscopy.   1. Hx of diverticulitis/family his of colon cancer in mother and  history of colon polyps and siblings -- I recommended repeat colonoscopy at this time given her history of diverticulitis and also family history of colon cancer and colon polyps. We discussed the test including the risks and benefits and she is agreeable to proceed.    KP:QAES [place], [address]  [place], [place] [address]

## 2014-12-29 ENCOUNTER — Encounter: Payer: Self-pay | Admitting: Internal Medicine

## 2015-01-04 ENCOUNTER — Encounter: Payer: Self-pay | Admitting: Internal Medicine

## 2015-01-26 ENCOUNTER — Telehealth: Payer: Self-pay | Admitting: Internal Medicine

## 2015-01-26 NOTE — Telephone Encounter (Signed)
A user error has taken place.

## 2015-01-26 NOTE — Telephone Encounter (Signed)
Patient is just scheduled for colonoscopy. endocolon was put on schedule in error but has been corrected. Patient advised.

## 2015-01-31 ENCOUNTER — Ambulatory Visit (AMBULATORY_SURGERY_CENTER): Payer: 59 | Admitting: Internal Medicine

## 2015-01-31 ENCOUNTER — Encounter: Payer: Self-pay | Admitting: Internal Medicine

## 2015-01-31 VITALS — BP 124/63 | HR 67 | Temp 97.6°F | Resp 16 | Ht 66.0 in | Wt 171.0 lb

## 2015-01-31 DIAGNOSIS — Z1211 Encounter for screening for malignant neoplasm of colon: Secondary | ICD-10-CM

## 2015-01-31 DIAGNOSIS — D122 Benign neoplasm of ascending colon: Secondary | ICD-10-CM | POA: Diagnosis not present

## 2015-01-31 DIAGNOSIS — D125 Benign neoplasm of sigmoid colon: Secondary | ICD-10-CM

## 2015-01-31 DIAGNOSIS — D124 Benign neoplasm of descending colon: Secondary | ICD-10-CM

## 2015-01-31 DIAGNOSIS — D120 Benign neoplasm of cecum: Secondary | ICD-10-CM | POA: Diagnosis not present

## 2015-01-31 MED ORDER — SODIUM CHLORIDE 0.9 % IV SOLN: 500.0000 mL | INTRAVENOUS | Status: DC

## 2015-01-31 NOTE — Progress Notes (Signed)
Report to PACU, RN, vss, BBS= Clear.  

## 2015-01-31 NOTE — Patient Instructions (Signed)
YOU HAD AN ENDOSCOPIC PROCEDURE TODAY AT [facility]:   Refer to the procedure report that was given to you for any specific questions about what was found during the examination.  If the procedure report does not answer your questions, please call your gastroenterologist to clarify.  If you requested that your care partner not be given the details of your procedure findings, then the procedure report has been included in a sealed envelope for you to review at your convenience later.  YOU SHOULD EXPECT: Some feelings of bloating in the abdomen. Passage of more gas than usual.  Walking can help get rid of the air that was put into your GI tract during the procedure and reduce the bloating. If you had a lower endoscopy (such as a colonoscopy or flexible sigmoidoscopy) you may notice spotting of blood in your stool or on the toilet paper. If you underwent a bowel prep for your procedure, you may not have a normal bowel movement for a few days.  Please Note:  You might notice some irritation and congestion in your nose or some drainage.  This is from the oxygen used during your procedure.  There is no need for concern and it should clear up in a day or so.  SYMPTOMS TO REPORT IMMEDIATELY:   Following lower endoscopy (colonoscopy or flexible sigmoidoscopy):  Excessive amounts of blood in the stool  Significant tenderness or worsening of abdominal pains  Swelling of the abdomen that is new, acute  Fever of 100F or higher   For urgent or emergent issues, a gastroenterologist can be reached at any hour by calling [phone].   DIET: Your first meal following the procedure should be a small meal and then it is ok to progress to your normal diet. Heavy or fried foods are harder to digest and may make you feel nauseous or bloated.  Likewise, meals heavy in dairy and vegetables can increase bloating.  Drink plenty of fluids but you should avoid alcoholic beverages for 24  hours.  ACTIVITY:  You should plan to take it easy for the rest of today and you should NOT DRIVE or use heavy machinery until tomorrow (because of the sedation medicines used during the test).    FOLLOW UP: Our staff will call the number listed on your records the next business day following your procedure to check on you and address any questions or concerns that you may have regarding the information given to you following your procedure. If we do not reach you, we will leave a message.  However, if you are feeling well and you are not experiencing any problems, there is no need to return our call.  We will assume that you have returned to your regular daily activities without incident.  If any biopsies were taken you will be contacted by phone or by letter within the next 1-3 weeks.  Please call us at [phone] if you have not heard about the biopsies in 3 weeks.    SIGNATURES/CONFIDENTIALITY: You and/or your care partner have signed paperwork which will be entered into your electronic medical record.  These signatures attest to the fact that that the information above on your After Visit Summary has been reviewed and is understood.  Full responsibility of the confidentiality of this discharge information lies with you and/or your care-partner.     Handouts were given to your care partner on polyps, diverticulosis,and a high fiber diet with liberal fluid intake. You may resume  current medications today. Await biopsy results. Please call if any questions or concerns.   

## 2015-01-31 NOTE — Progress Notes (Signed)
Called to room to assist during endoscopic procedure.  Patient ID and intended procedure confirmed with present staff. Received instructions for my participation in the procedure from the performing physician.  

## 2015-01-31 NOTE — Progress Notes (Signed)
HIPPA pt instructions gone over with pt only.  Info sealed in envelope and given to the pt on discharge.  No complaints noted in the recovery room. maw

## 2015-01-31 NOTE — Op Note (Signed)
[place]  [address]. [place], [address]   COLONOSCOPY PROCEDURE REPORT  PATIENT: Sandra Conrad, Sandra Conrad  MR#: [id number] BIRTHDATE: Aug 27, 1956 , 24  yrs. old GENDER: female ENDOSCOPIST: Jerene Bears, MD REFERRED PY:PPJK, Zach PROCEDURE DATE:  01/31/2015 PROCEDURE:   Colonoscopy, screening and Colonoscopy with snare polypectomy First Screening Colonoscopy - Avg.  risk and is 50 yrs.  old or older - No.  Prior Negative Screening - Now for repeat screening. N/A  History of Adenoma - Now for follow-up colonoscopy & has been > or = to 3 yrs.  N/A ASA CLASS:   Class II INDICATIONS:Screening for colonic neoplasia, FH Colon or Rectal Adenocarcinoma (mother), and FH Colon Adenoma (siblings). MEDICATIONS: Monitored anesthesia care and Propofol 300 mg IV  DESCRIPTION OF PROCEDURE:   After the risks benefits and alternatives of the procedure were thoroughly explained, informed consent was obtained.  The digital rectal exam revealed no rectal mass.   The LB [id number]  endoscope was introduced through the anus and advanced to the cecum, which was identified by both the appendix and ileocecal valve. No adverse events experienced. The quality of the prep was excellent.  (MoviPrep was used)  The instrument was then slowly withdrawn as the colon was fully examined.   COLON FINDINGS: Six sessile polyps ranging between 3-20mm in size were found in the ascending colon (1), at the cecum (1), in the sigmoid colon (3), and descending colon (1).  Polypectomies were performed with a cold snare.  The resection was complete, the polyp tissue was completely retrieved and sent to histology.   There was mild diverticulosis noted in the ascending colon, descending colon, and sigmoid colon.  Retroflexed views revealed no abnormalities. The time to cecum = 5.3 Withdrawal time = 13.8   The scope was withdrawn and the procedure completed. COMPLICATIONS: There were no immediate  complications.  ENDOSCOPIC IMPRESSION: 1.   Six sessile polyps ranging between 3-1mm in size were found in the ascending colon, at the cecum, in the sigmoid colon, and descending colon; polypectomies were performed with a cold snare 2.   Mild diverticulosis was noted in the ascending colon, descending colon, and sigmoid colon  RECOMMENDATIONS: 1.  Await pathology results 2.  High fiber diet 3.  Timing of repeat colonoscopy will be determined by pathology findings. 4.  You will receive a letter within 1-2 weeks with the results of your biopsy as well as final recommendations.  Please call my office if you have not received a letter after 3 weeks.  eSigned:  Jerene Bears, MD 01/31/2015 2:06 PM  cc: Wende Neighbors MD and The Patient

## 2015-02-01 ENCOUNTER — Telehealth: Payer: Self-pay | Admitting: *Deleted

## 2015-02-01 NOTE — Telephone Encounter (Signed)
  Follow up Call-  Call back number 01/31/2015  Post procedure Call Back phone  # [phone]  Permission to leave phone message Yes     Patient questions:  Do you have a fever, pain , or abdominal swelling? No. Pain Score  0 *  Have you tolerated food without any problems? Yes.    Have you been able to return to your normal activities? Yes.    Do you have any questions about your discharge instructions: Diet   No. Medications  No. Follow up visit  No.  Do you have questions or concerns about your Care? No.  Actions: * If pain score is 4 or above: No action needed, pain <4.

## 2015-02-06 ENCOUNTER — Encounter: Payer: Self-pay | Admitting: Internal Medicine

## 2015-05-01 ENCOUNTER — Encounter (HOSPITAL_COMMUNITY): Payer: Self-pay | Admitting: Emergency Medicine

## 2015-05-01 ENCOUNTER — Emergency Department (HOSPITAL_COMMUNITY)
Admission: EM | Admit: 2015-05-01 | Discharge: 2015-05-02 | Disposition: A | Discharge status: Home / Self Care | Payer: 59 | Attending: Emergency Medicine | Admitting: Emergency Medicine

## 2015-05-01 DIAGNOSIS — Z720 Tobacco use: Secondary | ICD-10-CM | POA: Insufficient documentation

## 2015-05-01 DIAGNOSIS — R45851 Suicidal ideations: Secondary | ICD-10-CM | POA: Diagnosis present

## 2015-05-01 DIAGNOSIS — G4700 Insomnia, unspecified: Secondary | ICD-10-CM | POA: Diagnosis not present

## 2015-05-01 DIAGNOSIS — I2510 Atherosclerotic heart disease of native coronary artery without angina pectoris: Secondary | ICD-10-CM | POA: Insufficient documentation

## 2015-05-01 DIAGNOSIS — Z859 Personal history of malignant neoplasm, unspecified: Secondary | ICD-10-CM | POA: Insufficient documentation

## 2015-05-01 DIAGNOSIS — E780 Pure hypercholesterolemia: Secondary | ICD-10-CM | POA: Insufficient documentation

## 2015-05-01 DIAGNOSIS — F419 Anxiety disorder, unspecified: Secondary | ICD-10-CM | POA: Diagnosis not present

## 2015-05-01 DIAGNOSIS — Z79899 Other long term (current) drug therapy: Secondary | ICD-10-CM | POA: Insufficient documentation

## 2015-05-01 DIAGNOSIS — F329 Major depressive disorder, single episode, unspecified: Secondary | ICD-10-CM | POA: Insufficient documentation

## 2015-05-01 DIAGNOSIS — M1990 Unspecified osteoarthritis, unspecified site: Secondary | ICD-10-CM | POA: Insufficient documentation

## 2015-05-01 DIAGNOSIS — F332 Major depressive disorder, recurrent severe without psychotic features: Secondary | ICD-10-CM | POA: Diagnosis not present

## 2015-05-01 DIAGNOSIS — Z9889 Other specified postprocedural states: Secondary | ICD-10-CM | POA: Diagnosis not present

## 2015-05-01 HISTORY — DX: Depression, unspecified: F32.A

## 2015-05-01 HISTORY — DX: Major depressive disorder, single episode, unspecified: F32.9

## 2015-05-01 MED ORDER — ACETAMINOPHEN 325 MG PO TABS: 650.0000 mg | ORAL_TABLET | ORAL | Status: DC | PRN

## 2015-05-01 MED ORDER — IBUPROFEN 200 MG PO TABS
600.0000 mg | ORAL_TABLET | Freq: Three times a day (TID) | ORAL | Status: DC | PRN
  Administered 2015-05-01 – 2015-05-02 (×3): 600 mg via ORAL
  Filled 2015-05-01 (×5): qty 3

## 2015-05-01 MED ORDER — ATORVASTATIN CALCIUM 40 MG PO TABS
40.0000 mg | ORAL_TABLET | Freq: Every day | ORAL | Status: DC
  Administered 2015-05-01 – 2015-05-02 (×2): 40 mg via ORAL
  Filled 2015-05-01 (×2): qty 1

## 2015-05-01 MED ORDER — ZOLPIDEM TARTRATE 5 MG PO TABS
5.0000 mg | ORAL_TABLET | Freq: Every evening | ORAL | Status: DC | PRN
  Administered 2015-05-01: 5 mg via ORAL
  Filled 2015-05-01: qty 1

## 2015-05-01 MED ORDER — LORAZEPAM 0.5 MG PO TABS
1.0000 mg | ORAL_TABLET | Freq: Three times a day (TID) | ORAL | Status: DC | PRN
  Administered 2015-05-01 – 2015-05-02 (×5): 1 mg via ORAL
  Filled 2015-05-01: qty 2
  Filled 2015-05-01: qty 1
  Filled 2015-05-01 (×3): qty 2

## 2015-05-01 MED ORDER — NICOTINE 21 MG/24HR TD PT24
21.0000 mg | MEDICATED_PATCH | Freq: Every day | TRANSDERMAL | Status: DC
  Administered 2015-05-01 – 2015-05-02 (×2): 21 mg via TRANSDERMAL
  Filled 2015-05-01 (×2): qty 1

## 2015-05-01 MED ORDER — ONDANSETRON HCL 4 MG PO TABS: 4.0000 mg | ORAL_TABLET | Freq: Three times a day (TID) | ORAL | Status: DC | PRN

## 2015-05-01 MED ORDER — HYDROXYZINE HCL 50 MG/ML IM SOLN: 50.0000 mg | Freq: Four times a day (QID) | INTRAMUSCULAR | Status: DC | PRN

## 2015-05-01 MED ORDER — VORTIOXETINE HBR 5 MG PO TABS
5.0000 mg | ORAL_TABLET | Freq: Every day | ORAL | Status: DC
  Administered 2015-05-02: 5 mg via ORAL
  Filled 2015-05-01 (×2): qty 1

## 2015-05-01 MED ORDER — HYDROXYZINE HCL 25 MG PO TABS
50.0000 mg | ORAL_TABLET | Freq: Four times a day (QID) | ORAL | Status: DC | PRN
  Administered 2015-05-01 – 2015-05-02 (×4): 50 mg via ORAL
  Filled 2015-05-01 (×4): qty 2

## 2015-05-01 NOTE — ED Notes (Signed)
Pt c/o extreme anxiety and feels like the walls are closing in.  She is asking if she can leave hospital.. She was reassured of her safety and encouraged to stay in hospital.  15 minute checks are in place.

## 2015-05-01 NOTE — ED Notes (Signed)
Pt. Oriented to room and unit.  She has a flat affect but denies SI at this time.  She does contract for safety.

## 2015-05-01 NOTE — BH Assessment (Addendum)
[place] Assessment Progress Note   Spoke to Debbie to request tts machine be placed in pt room.

## 2015-05-01 NOTE — ED Notes (Signed)
Pt changed to scrubs and wanded by security.

## 2015-05-01 NOTE — BH Assessment (Signed)
[facility] Assessment Progress Note   Attempted to connect to complete assessment.  No answer

## 2015-05-01 NOTE — BH Assessment (Addendum)
Tele Assessment Note   Sandra Conrad is an 59 y.o. female who presents to [facility] for evaluation of depression and SI.  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  She states that she has had periods of depression throughout her life and that her most recent one was almost two years ago.  Around the beginning of May, she noticed that it was getting bad again.  Two weeks ago, she had a "break down" at work and had to leave.  She states she hasn't worked since then because she has felt like her thoughts weren't clear enough to give appropriate patient care.  She reports fragmented thoughts, difficulty sleeping, decreased appetite with 13 lb weight loss, tearfulness, and vegetative symptoms.  She reports having more uncontrollable thoughts to overdose on her klonipin, but states she keeps from doing it because she is worried about ending up in a persistant vegetative state and leaving her family to deal with her.  She denies HI, AVH, and SA.  She reprots her PCP, Dr Delphina Cahill, said she could go up on her zoloft or switch over to a new medication (that she reports sounds like triplex).  She chose to switch and is concerned that this may have caused the worsening depression and SI.  She reports no previous history of SI and no family history of SI.  Dr Dwyane Dee recommends inpatient treatment.  Axis I: Major Depression, Recurrent severe Axis II: Deferred Axis III:  Past Medical History  Diagnosis Date  . Hypercholesteremia   . Coronary artery disease   . Cancer   . Arthritis   . Anxiety   . Osteoporosis   . Insomnia   . Abdominal pain   . Diarrhea   . Depression    Axis IV: problems with access to health care services Axis V: 41-50 serious symptoms  Past Medical History:  Past Medical History  Diagnosis Date  . Hypercholesteremia   . Coronary artery disease   . Cancer   . Arthritis   . Anxiety   . Osteoporosis   . Insomnia   . Abdominal  pain   . Diarrhea   . Depression     Past Surgical History  Procedure Laterality Date  . Cardiac catheterization    . Breast lumpectomy    . Hip surgery    . Wrist surgery    . Dilation and curettage of uterus      Family History:  Family History  Problem Relation Age of Onset  . Colon cancer Mother   . Heart attack Father     Social History:  reports that she has been smoking.  She does not have any smokeless tobacco history on file. She reports that she does not drink alcohol or use illicit drugs.  Additional Social History:     CIWA: CIWA-Ar BP: 106/70 mmHg Pulse Rate: 108 COWS:    PATIENT STRENGTHS: (choose at least two) Ability for insight Capable of independent living Occupational psychologist fund of knowledge Motivation for treatment/growth Physical Health Supportive family/friends Work skills  Allergies:  Allergies  Allergen Reactions  . Cefdinir Nausea Only and Other (See Comments)    Abdominal pain     Home Medications:  (Not in a hospital admission)  OB/GYN Status:  No LMP recorded. Patient is postmenopausal.  General Assessment Data Location of Assessment: [facility] ED TTS Assessment: In system Is this a Tele or Face-to-Face Assessment?: Tele Assessment  Is this an Initial Assessment or a Re-assessment for this encounter?: Initial Assessment Marital status: [place] name: [name] Is patient pregnant?: No Pregnancy Status: No Living Arrangements: Alone Can pt return to current living arrangement?: Yes Admission Status: Voluntary Is patient capable of signing voluntary admission?: Yes Referral Source: Self/Family/Friend Insurance type: [place] Screening Exam ([place]) Medical Exam completed: Yes  Crisis Care Plan Living Arrangements: Alone Name of Psychiatrist: Dr. 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 : No Suicidal Intent: No Has patient had any suicidal intent within the past 6 months prior to admission? : No Is patient at risk for suicide?: Yes Suicidal Plan?: Yes-Currently Present Has patient had any suicidal plan within the past 6 months prior to admission? : Yes Specify Current Suicidal Plan: OD on Klonipin Access to Means: Yes Specify Access to Suicidal Means: RX medication What has been your use of drugs/alcohol within the last 12 months?: na Previous Attempts/Gestures: No How many times?: 0 Intentional Self Injurious Behavior: None Family Suicide History: Yes (sister has bipolar) Recent stressful life event(s): Turmoil (Comment) (move to [place] from [place], new job) Persecutory voices/beliefs?: No Depression: Yes Depression Symptoms: Feeling worthless/self pity, Feeling angry/irritable, Loss of interest in usual pleasures, Insomnia, Isolating, Guilt, Fatigue, Despondent Substance abuse history and/or treatment for substance abuse?: No Suicide prevention information given to non-admitted patients: Not applicable  Risk to Others within the past 6 months Homicidal Ideation: No Does patient have any lifetime risk of violence toward others beyond the six months prior to admission? : No Thoughts of Harm to Others: No Current Homicidal Intent: No Current Homicidal Plan: No Access to Homicidal Means: No History of harm to others?: No Assessment of Violence: None Noted Does patient have access to weapons?: No Criminal Charges Pending?: No Does patient have a court date: No Is patient on probation?: No  Psychosis Hallucinations: None noted Delusions: None noted  Mental Status Report Appearance/Hygiene: Unremarkable, In scrubs Eye Contact: Good Motor Activity: Freedom of movement Speech: Logical/coherent Level of Consciousness:  Alert Mood: Depressed Affect: Appropriate to circumstance, Depressed Anxiety Level: Severe Thought Processes: Coherent, Relevant Judgement: Unimpaired Orientation: Person, Place, Time, Situation Obsessive Compulsive Thoughts/Behaviors: Minimal  Cognitive Functioning Concentration: Decreased Memory: Recent Impaired, Remote Impaired IQ: Average Insight: Fair Impulse Control: Good Appetite: Poor Weight Loss: 13 Weight Gain: 0 Sleep: Decreased Total Hours of Sleep: 6 (broken) Vegetative Symptoms: Staying in bed, Decreased grooming  ADLScreening [facility] Assessment Services) Patient's cognitive ability adequate to safely complete daily activities?: Yes Patient able to express need for assistance with ADLs?: Yes Independently performs ADLs?: Yes (appropriate for developmental age)  Prior Inpatient Therapy Prior Inpatient Therapy: No  Prior Outpatient Therapy Prior Outpatient Therapy: Yes Does patient have an ACCT team?: No Does patient have Intensive In-House Services?  : No Does patient have [facility] services? : No Does patient have P4CC services?: No  ADL Screening (condition at time of admission) Patient's cognitive ability adequate to safely complete daily activities?: Yes Patient able to express need for assistance with ADLs?: Yes Independently performs ADLs?: Yes (appropriate for developmental age)       Abuse/Neglect Assessment (Assessment to be complete while patient is alone) Physical Abuse: Denies Verbal Abuse: Denies Sexual Abuse: Denies  Advance Directives (For Healthcare) Does patient have an advance directive?: No Would patient like information on creating an advanced directive?: No - patient declined information    Additional Information 1:1 In Past 12 Months?: No CIRT Risk: No Elopement Risk: No Does patient have medical clearance?: Yes     Disposition:  Disposition Initial Assessment Completed for this Encounter: Yes Disposition of Patient:  Inpatient treatment program  Darlys Gales 05/01/2015 2:37 PM

## 2015-05-01 NOTE — ED Notes (Signed)
Per pt, states she was recently changed to a new anti-depressant and is now having suicidal thoughts-has an appointment with psychiatrist this afternoon-daughter wanted her checked out

## 2015-05-01 NOTE — ED Provider Notes (Signed)
CSN: [id number]     Arrival date & time 05/01/15  1241 History   First MD Initiated Contact with Patient 05/01/15 1340     Chief Complaint  Patient presents with  . suicidal thoughts       HPI Pt with increasing suicidal thoughts on a hx of depression in the past. Recently underwent medication change with her primary care physician. Denies ETOH and drug abuse. No plan at this time but she became concerned when she began thinking of a plan.    Past Medical History  Diagnosis Date  . Hypercholesteremia   . Coronary artery disease   . Cancer   . Arthritis   . Anxiety   . Osteoporosis   . Insomnia   . Abdominal pain   . Diarrhea   . Depression    Past Surgical History  Procedure Laterality Date  . Cardiac catheterization    . Breast lumpectomy    . Hip surgery    . Wrist surgery    . Dilation and curettage of uterus     Family History  Problem Relation Age of Onset  . Colon cancer Mother   . Heart attack Father    History  Substance Use Topics  . Smoking status: Current Every Day Smoker  . Smokeless tobacco: Not on file  . Alcohol Use: No   OB History    No data available     Review of Systems  All other systems reviewed and are negative.     Allergies  Cefdinir  Home Medications   Prior to Admission medications   Medication Sig Start Date End Date Taking? Authorizing Provider  acetaminophen (TYLENOL) 500 MG tablet Take 1,000 mg by mouth every 6 (six) hours as needed for moderate pain.   Yes Historical Provider, MD  atorvastatin (LIPITOR) 40 MG tablet Take 40 mg by mouth daily.   Yes Historical Provider, MD  clonazePAM (KLONOPIN) 1 MG tablet Take 1 mg by mouth 3 (three) times daily as needed for anxiety.  10/05/14  Yes Historical Provider, MD  ibuprofen (ADVIL,MOTRIN) 200 MG tablet Take 800 mg by mouth every 6 (six) hours as needed for pain.   Yes Historical Provider, MD  Vortioxetine HBr (BRINTELLIX) 5 MG TABS Take 5 mg by mouth daily.   Yes Historical  Provider, MD  HYDROcodone-homatropine (HYCODAN) 5-1.5 MG/5ML syrup Take 5 mLs by mouth every 6 (six) hours as needed for cough. Patient not taking: Reported on 12/06/2014 10/22/14   Blanchie Dessert, MD  metoCLOPramide (REGLAN) 10 MG tablet Take 1 tablet (10 mg total) by mouth as directed. Patient not taking: Reported on 05/01/2015 12/06/14   Jerene Bears, MD   BP 106/70 mmHg  Pulse 108  Temp(Src) 98.5 F (36.9 C) (Oral)  Resp 18  SpO2 97% Physical Exam  Constitutional: She is oriented to person, place, and time. She appears well-developed and well-nourished. No distress.  HENT:  Head: Normocephalic and atraumatic.  Eyes: EOM are normal.  Neck: Normal range of motion.  Cardiovascular: Normal rate, regular rhythm and normal heart sounds.   Pulmonary/Chest: Effort normal and breath sounds normal.  Abdominal: Soft. She exhibits no distension. There is no tenderness.  Musculoskeletal: Normal range of motion.  Neurological: She is alert and oriented to person, place, and time.  Skin: Skin is warm and dry.  Psychiatric:  Depression, suicidal thoughts  Nursing note and vitals reviewed.   ED Course  Procedures (including critical care time) Labs Review Labs Reviewed - No data  to display  Imaging Review No results found.   EKG Interpretation None      MDM   Final diagnoses:  None    Pt needs placement for suicidal thoughts. Agreeable to stay at this time. Will need IVC if she decides to leave. Pt and family updated    Jola Schmidt, MD 05/01/15 1356

## 2015-05-01 NOTE — ED Notes (Signed)
Tele-psych at bedside.

## 2015-05-01 NOTE — ED Notes (Signed)
Pt's ring placed in short's pocket.

## 2015-05-01 NOTE — ED Notes (Signed)
Bed: [facility]35 Expected date:  Expected time:  Means of arrival:  Comments: Hold for triage 3 

## 2015-05-01 NOTE — ED Notes (Signed)
In restroom changing

## 2015-05-02 ENCOUNTER — Encounter: Payer: Self-pay | Admitting: Psychiatry

## 2015-05-02 ENCOUNTER — Inpatient Hospital Stay
Admission: EM | Admit: 2015-05-02 | Discharge: 2015-05-03 | DRG: 885 | Disposition: A | Discharge status: Home / Self Care | Payer: 59 | Source: Other Acute Inpatient Hospital | Attending: Psychiatry | Admitting: Psychiatry

## 2015-05-02 DIAGNOSIS — F419 Anxiety disorder, unspecified: Secondary | ICD-10-CM | POA: Diagnosis present

## 2015-05-02 DIAGNOSIS — Z818 Family history of other mental and behavioral disorders: Secondary | ICD-10-CM | POA: Diagnosis not present

## 2015-05-02 DIAGNOSIS — Z859 Personal history of malignant neoplasm, unspecified: Secondary | ICD-10-CM | POA: Diagnosis not present

## 2015-05-02 DIAGNOSIS — F332 Major depressive disorder, recurrent severe without psychotic features: Principal | ICD-10-CM | POA: Diagnosis present

## 2015-05-02 DIAGNOSIS — M1990 Unspecified osteoarthritis, unspecified site: Secondary | ICD-10-CM | POA: Diagnosis present

## 2015-05-02 DIAGNOSIS — F17210 Nicotine dependence, cigarettes, uncomplicated: Secondary | ICD-10-CM | POA: Diagnosis present

## 2015-05-02 DIAGNOSIS — Z800 Family history of malignant neoplasm of digestive organs: Secondary | ICD-10-CM | POA: Diagnosis not present

## 2015-05-02 DIAGNOSIS — Z9889 Other specified postprocedural states: Secondary | ICD-10-CM

## 2015-05-02 DIAGNOSIS — F329 Major depressive disorder, single episode, unspecified: Secondary | ICD-10-CM | POA: Diagnosis not present

## 2015-05-02 DIAGNOSIS — M810 Age-related osteoporosis without current pathological fracture: Secondary | ICD-10-CM | POA: Diagnosis present

## 2015-05-02 DIAGNOSIS — Z8249 Family history of ischemic heart disease and other diseases of the circulatory system: Secondary | ICD-10-CM | POA: Diagnosis not present

## 2015-05-02 DIAGNOSIS — E780 Pure hypercholesterolemia: Secondary | ICD-10-CM | POA: Diagnosis present

## 2015-05-02 DIAGNOSIS — G4700 Insomnia, unspecified: Secondary | ICD-10-CM | POA: Diagnosis present

## 2015-05-02 DIAGNOSIS — I2510 Atherosclerotic heart disease of native coronary artery without angina pectoris: Secondary | ICD-10-CM | POA: Diagnosis present

## 2015-05-02 DIAGNOSIS — R45851 Suicidal ideations: Secondary | ICD-10-CM | POA: Diagnosis present

## 2015-05-02 LAB — CBC WITH DIFFERENTIAL/PLATELET
Basophils Absolute: 0.1 10*3/uL (ref 0.0–0.1)
Basophils Relative: 1 % (ref 0–1)
Eosinophils Absolute: 0.1 10*3/uL (ref 0.0–0.7)
Eosinophils Relative: 1 % (ref 0–5)
HEMATOCRIT: 44.1 % (ref 36.0–46.0)
HEMOGLOBIN: 14.1 g/dL (ref 12.0–15.0)
Lymphocytes Relative: 16 % (ref 12–46)
Lymphs Abs: 1.6 10*3/uL (ref 0.7–4.0)
MCH: 31.5 pg (ref 26.0–34.0)
MCHC: 32 g/dL (ref 30.0–36.0)
MCV: 98.4 fL (ref 78.0–100.0)
MONOS PCT: 7 % (ref 3–12)
Monocytes Absolute: 0.8 10*3/uL (ref 0.1–1.0)
NEUTROS PCT: 75 % (ref 43–77)
Neutro Abs: 7.6 10*3/uL (ref 1.7–7.7)
Platelets: 316 10*3/uL (ref 150–400)
RBC: 4.48 MIL/uL (ref 3.87–5.11)
RDW: 13.2 % (ref 11.5–15.5)
WBC: 10.1 10*3/uL (ref 4.0–10.5)

## 2015-05-02 LAB — TSH: TSH: 2.052 u[IU]/mL (ref 0.350–4.500)

## 2015-05-02 LAB — COMPREHENSIVE METABOLIC PANEL
ALBUMIN: 3.6 g/dL (ref 3.5–5.0)
ALT: 12 U/L — AB (ref 14–54)
ANION GAP: 8 (ref 5–15)
AST: 11 U/L — AB (ref 15–41)
Alkaline Phosphatase: 70 U/L (ref 38–126)
BUN: 16 mg/dL (ref 6–20)
CHLORIDE: 104 mmol/L (ref 101–111)
CO2: 27 mmol/L (ref 22–32)
Calcium: 9.3 mg/dL (ref 8.9–10.3)
Creatinine, Ser: 0.63 mg/dL (ref 0.44–1.00)
GFR calc non Af Amer: >60 mL/min (ref >60)
Glucose, Bld: 95 mg/dL (ref 65–99)
POTASSIUM: 4.3 mmol/L (ref 3.5–5.1)
SODIUM: 139 mmol/L (ref 135–145)
Total Bilirubin: 0.4 mg/dL (ref 0.3–1.2)
Total Protein: 6.9 g/dL (ref 6.5–8.1)

## 2015-05-02 LAB — URINALYSIS W MICROSCOPIC (NOT AT ARMC)
BILIRUBIN URINE: NEGATIVE
Glucose, UA: NEGATIVE mg/dL
Hgb urine dipstick: NEGATIVE
Ketones, ur: NEGATIVE mg/dL
Nitrite: NEGATIVE
Protein, ur: NEGATIVE mg/dL
SPECIFIC GRAVITY, URINE: 1.015 (ref 1.005–1.030)
Urobilinogen, UA: 0.2 mg/dL (ref 0.0–1.0)
pH: 6 (ref 5.0–8.0)

## 2015-05-02 MED ORDER — ALUM & MAG HYDROXIDE-SIMETH 200-200-20 MG/5ML PO SUSP: 30.0000 mL | ORAL | Status: DC | PRN

## 2015-05-02 MED ORDER — ZOLPIDEM TARTRATE 5 MG PO TABS
5.0000 mg | ORAL_TABLET | Freq: Once | ORAL | Status: AC
  Administered 2015-05-02: 5 mg via ORAL
  Filled 2015-05-02: qty 1

## 2015-05-02 MED ORDER — HYDROXYZINE HCL 50 MG PO TABS
50.0000 mg | ORAL_TABLET | Freq: Once | ORAL | Status: AC
  Administered 2015-05-02: 50 mg via ORAL
  Filled 2015-05-02: qty 1

## 2015-05-02 MED ORDER — MAGNESIUM HYDROXIDE 400 MG/5ML PO SUSP: 30.0000 mL | Freq: Every day | ORAL | Status: DC | PRN

## 2015-05-02 MED ORDER — TRAZODONE HCL 100 MG PO TABS: 100.0000 mg | ORAL_TABLET | Freq: Every evening | ORAL | Status: DC | PRN

## 2015-05-02 NOTE — ED Notes (Signed)
Patient inquired about plan of care. Reports that she has no SI, HI, AVH. States that upon discharge she will stay with her daughter and grandchildren. States that she would like out patient resources. If in-patient is the continued plan, patient requests to stay in [place] as a lot of travel is going to be difficult for her daughter because of work and young children.   Given Ambien and Ativan for anxiety and sleep.   Q 15 safety checks continue.

## 2015-05-02 NOTE — ED Notes (Signed)
Pt complains of anxiety and hip pain.  She said she didn't sleep well on unit.  She is Alert and oriented denies SI, HI AVH  She is getting up to shower.

## 2015-05-02 NOTE — BH Assessment (Signed)
[facility] Assessment Progress Note   The following facilities have been contacted to seek placement for this pt, with results as noted:  Beds available, information sent, decision pending:  [facility]   At capacity:  [facility], [place] Triage Specialist [phone]

## 2015-05-02 NOTE — ED Notes (Signed)
Patient denies SI, HI, AVH. Reports increasing anxiety r/t thoughts of pending transfer. Reports stiffness related to arthritis. Rates feelings of depression between 5 and 6/10 and states she is better than yesterday.   Encouragement offered.   Q 15 safety checks continue.

## 2015-05-02 NOTE — Consult Note (Signed)
[facility] Psychiatry Consult   Reason for Consult:  Recurrent Major depressive disorder, severe Referring Physician:  EDP Patient Identification: Sandra Conrad MRN:  [id number] Principal Diagnosis: Major depressive disorder, recurrent severe without psychotic features Diagnosis:   Patient Active Problem List   Diagnosis Date Noted  . Major depressive disorder, recurrent severe without psychotic features [F33.2] 05/02/2015    Priority: High  . Abdominal pain [R10.9]   . Diarrhea [R19.7]     Total Time spent with patient: 1 hour  Subjective:   Sandra Conrad is a 59 y.o. female patient admitted with Recurrent Major depressive disorder, severe  HPI:  Caucasian female,59 years old was evaluated for recurrent major depression.   Two weeks ago she had a melt down and had to leave work. Patient reports suffering from depression over 10 years ago after a devastating divorce.  She also lost her mother 6 years ago, lost a brother and the relocation to  to join her daughter are her stressors.  Recently her Zoloft was changed to one of the newer  Antidepressants Vortioxetine which patient is not sure is working for her.  Patient reports that she feels helpless, suffers from insomnia and poor appetite.  Patient reports that on few occassions she felt suicidal but had no plans.  Patient adamantly stated she does not want to kill herself because because she love her children and grand Children.  She denies SI/HI/AVH.  Patient has been accepted for admission and we will be seeking placement at any facility with available beds.  HPI Elements:   Location:  MDD, Recurrent, severe, Suicidal thoughts. Quality:  severe, feels helpless, insomnia. Severity:  severe. Timing:  acute. Duration:  10 years ago. Context:  Seeking treatment for increased depression.  Past Medical History:  Past Medical History  Diagnosis Date  . Hypercholesteremia   . Coronary artery disease   . Cancer   . Arthritis   .  Anxiety   . Osteoporosis   . Insomnia   . Abdominal pain   . Diarrhea   . Depression     Past Surgical History  Procedure Laterality Date  . Cardiac catheterization    . Breast lumpectomy    . Hip surgery    . Wrist surgery    . Dilation and curettage of uterus     Family History:  Family History  Problem Relation Age of Onset  . Colon cancer Mother   . Heart attack Father    Social History:  History  Alcohol Use No     History  Drug Use No    History   Social History  . Marital Status: Divorced    Spouse Name: N/A  . Number of Children: N/A  . Years of Education: N/A   Social History Main Topics  . Smoking status: Current Every Day Smoker  . Smokeless tobacco: Not on file  . Alcohol Use: No  . Drug Use: No  . Sexual Activity: Not on file   Other Topics Concern  . None   Social History Narrative   Additional Social History:                          Allergies:   Allergies  Allergen Reactions  . Cefdinir Nausea Only and Other (See Comments)    Abdominal pain     Labs:  Results for orders placed or performed during the hospital encounter of 05/01/15 (from the past 48 hour(s))  Comprehensive metabolic  panel     Status: Abnormal   Collection Time: 05/02/15 12:31 PM  Result Value Ref Range   Sodium 139 135 - 145 mmol/L   Potassium 4.3 3.5 - 5.1 mmol/L   Chloride 104 101 - 111 mmol/L   CO2 27 22 - 32 mmol/L   Glucose, Bld 95 65 - 99 mg/dL   BUN 16 6 - 20 mg/dL   Creatinine, Ser 0.63 0.44 - 1.00 mg/dL   Calcium 9.3 8.9 - 10.3 mg/dL   Total Protein 6.9 6.5 - 8.1 g/dL   Albumin 3.6 3.5 - 5.0 g/dL   AST 11 (L) 15 - 41 U/L   ALT 12 (L) 14 - 54 U/L   Alkaline Phosphatase 70 38 - 126 U/L   Total Bilirubin 0.4 0.3 - 1.2 mg/dL   GFR calc non Af Amer >60 >60 mL/min   GFR calc Af Amer >60 >60 mL/min    Comment: (NOTE) The eGFR has been calculated using the CKD EPI equation. This calculation has not been validated in all clinical  situations. eGFR's persistently <60 mL/min signify possible Chronic Kidney Disease.    Anion gap 8 5 - 15  CBC with Differential/Platelet     Status: None   Collection Time: 05/02/15 12:31 PM  Result Value Ref Range   WBC 10.1 4.0 - 10.5 K/uL   RBC 4.48 3.87 - 5.11 MIL/uL   Hemoglobin 14.1 12.0 - 15.0 g/dL   HCT 44.1 36.0 - 46.0 %   MCV 98.4 78.0 - 100.0 fL   MCH 31.5 26.0 - 34.0 pg   MCHC 32.0 30.0 - 36.0 g/dL   RDW 13.2 11.5 - 15.5 %   Platelets 316 150 - 400 K/uL   Neutrophils Relative % 75 43 - 77 %   Neutro Abs 7.6 1.7 - 7.7 K/uL   Lymphocytes Relative 16 12 - 46 %   Lymphs Abs 1.6 0.7 - 4.0 K/uL   Monocytes Relative 7 3 - 12 %   Monocytes Absolute 0.8 0.1 - 1.0 K/uL   Eosinophils Relative 1 0 - 5 %   Eosinophils Absolute 0.1 0.0 - 0.7 K/uL   Basophils Relative 1 0 - 1 %   Basophils Absolute 0.1 0.0 - 0.1 K/uL  TSH     Status: None   Collection Time: 05/02/15 12:31 PM  Result Value Ref Range   TSH 2.052 0.350 - 4.500 uIU/mL  Urinalysis with microscopic     Status: Abnormal   Collection Time: 05/02/15  2:08 PM  Result Value Ref Range   Color, Urine YELLOW YELLOW   APPearance CLEAR CLEAR   Specific Gravity, Urine 1.015 1.005 - 1.030   pH 6.0 5.0 - 8.0   Glucose, UA NEGATIVE NEGATIVE mg/dL   Hgb urine dipstick NEGATIVE NEGATIVE   Bilirubin Urine NEGATIVE NEGATIVE   Ketones, ur NEGATIVE NEGATIVE mg/dL   Protein, ur NEGATIVE NEGATIVE mg/dL   Urobilinogen, UA 0.2 0.0 - 1.0 mg/dL   Nitrite NEGATIVE NEGATIVE   Leukocytes, UA MODERATE (A) NEGATIVE   WBC, UA 3-6 <3 WBC/hpf   Bacteria, UA RARE RARE   Squamous Epithelial / LPF MANY (A) RARE    Vitals: Blood pressure 102/61, pulse 85, temperature 97.8 F (36.6 C), temperature source Oral, resp. rate 16, SpO2 99 %.  Risk to Self: Suicidal Ideation: Yes-Currently Present Suicidal Intent: No Is patient at risk for suicide?: Yes Suicidal Plan?: Yes-Currently Present Specify Current Suicidal Plan: OD on Klonipin Access  to Means: Yes Specify Access  to Suicidal Means: RX medication What has been your use of drugs/alcohol within the last 12 months?: na How many times?: 0 Intentional Self Injurious Behavior: None Risk to Others: Homicidal Ideation: No Thoughts of Harm to Others: No Current Homicidal Intent: No Current Homicidal Plan: No Access to Homicidal Means: No History of harm to others?: No Assessment of Violence: None Noted Does patient have access to weapons?: No Criminal Charges Pending?: No Does patient have a court date: No Prior Inpatient Therapy: Prior Inpatient Therapy: No Prior Outpatient Therapy: Prior Outpatient Therapy: Yes Does patient have an ACCT team?: No Does patient have Intensive In-House Services?  : No Does patient have [facility] services? : No Does patient have P4CC services?: No  Current Facility-Administered Medications  Medication Dose Route Frequency Provider Last Rate Last Dose  . acetaminophen (TYLENOL) tablet 650 mg  650 mg Oral Q4H PRN Jola Schmidt, MD      . atorvastatin (LIPITOR) tablet 40 mg  40 mg Oral Daily Jola Schmidt, MD   40 mg at 05/02/15 0959  . hydrOXYzine (ATARAX/VISTARIL) tablet 50 mg  50 mg Oral Q6H PRN Courteney Lyn Mackuen, MD   50 mg at 05/02/15 0909  . ibuprofen (ADVIL,MOTRIN) tablet 600 mg  600 mg Oral Q8H PRN Jola Schmidt, MD   600 mg at 05/02/15 0732  . LORazepam (ATIVAN) tablet 1 mg  1 mg Oral Q8H PRN Jola Schmidt, MD   1 mg at 05/02/15 1452  . nicotine (NICODERM CQ - dosed in mg/24 hours) patch 21 mg  21 mg Transdermal Daily Jola Schmidt, MD   21 mg at 05/02/15 0959  . ondansetron (ZOFRAN) tablet 4 mg  4 mg Oral Q8H PRN Jola Schmidt, MD      . Vortioxetine HBr TABS 5 mg  5 mg Oral Daily Jola Schmidt, MD   5 mg at 05/02/15 1319  . zolpidem (AMBIEN) tablet 5 mg  5 mg Oral QHS PRN Jola Schmidt, MD   5 mg at 05/01/15 2144   Current Outpatient Prescriptions  Medication Sig Dispense Refill  . acetaminophen (TYLENOL) 500 MG tablet Take 1,000 mg by  mouth every 6 (six) hours as needed for moderate pain.    Marland Kitchen atorvastatin (LIPITOR) 40 MG tablet Take 40 mg by mouth daily.    . clonazePAM (KLONOPIN) 1 MG tablet Take 1 mg by mouth 3 (three) times daily as needed for anxiety.   0  . ibuprofen (ADVIL,MOTRIN) 200 MG tablet Take 800 mg by mouth every 6 (six) hours as needed for pain.    . Vortioxetine HBr (BRINTELLIX) 5 MG TABS Take 5 mg by mouth daily.    Marland Kitchen HYDROcodone-homatropine (HYCODAN) 5-1.5 MG/5ML syrup Take 5 mLs by mouth every 6 (six) hours as needed for cough. (Patient not taking: Reported on 12/06/2014) 120 mL 0  . metoCLOPramide (REGLAN) 10 MG tablet Take 1 tablet (10 mg total) by mouth as directed. (Patient not taking: Reported on 05/01/2015) 2 tablet 0    Musculoskeletal: Strength & Muscle Tone: within normal limits Gait & Station: normal Patient leans: N/A  Psychiatric Specialty Exam: Physical Exam  Review of Systems  Constitutional: Negative.   HENT: Negative.   Eyes: Negative.   Respiratory: Negative.   Cardiovascular: Negative.   Genitourinary: Negative.   Musculoskeletal: Negative.   Skin: Negative.   Neurological: Negative.   Endo/Heme/Allergies: Negative.     Blood pressure 102/61, pulse 85, temperature 97.8 F (36.6 C), temperature source Oral, resp. rate 16, SpO2 99 %.There is no  weight on file to calculate BMI.  General Appearance: Casual  Eye Contact::  Good  Speech:  Clear and Coherent and Normal Rate  Volume:  Normal  Mood:  Anxious and Depressed  Affect:  Congruent, Depressed, Flat and Tearful  Thought Process:  Coherent, Goal Directed and Intact  Orientation:  Full (Time, Place, and Person)  Thought Content:  WDL  Suicidal Thoughts:  No  Homicidal Thoughts:  No  Memory:  Immediate;   Good Recent;   Good Remote;   Good  Judgement:  Good  Insight:  Good  Psychomotor Activity:  Normal  Concentration:  Good  Recall:  NA  Fund of Knowledge:Good  Language: Good  Akathisia:  NA  Handed:  Right   AIMS (if indicated):     Assets:  Desire for Improvement  ADL's:  Intact  Cognition: WNL  Sleep:      Medical Decision Making: Review of Psycho-Social Stressors (1) and Established Problem, Worsening (2)  Treatment Plan Summary: Daily contact with patient to assess and evaluate symptoms and progress in treatment and Medication management  Plan:  Resume home medications, will use Vistarail 50 mg po every 8 hours as needed for anxiety, Ativan 1 mg po every 8 hours as needed for anxiety/agitataion. Disposition: Admit and seek placement.  Delfin Gant  PMHNP-BC 05/02/2015 4:00 PM Patient seen face-to-face for psychiatric evaluation, chart reviewed and case discussed with the physician extender and developed treatment plan. Reviewed the information documented and agree with the treatment plan. Corena Pilgrim, MD

## 2015-05-02 NOTE — ED Notes (Signed)
Ativan given for anxiety and tearfulness. Patient cooperative. Pain decreased.

## 2015-05-02 NOTE — ED Notes (Signed)
Pt continues to be very anxious and jittery.  She denies SI, HI, or AVH at this time.  Pt. Does contract for safety.  15 minute checks continue.  Pt was reassured  Of her safety.

## 2015-05-02 NOTE — BH Assessment (Signed)
Sent referrals to the following places: [facility], [place] Triage Specialist 05/02/2015 2:39 AM

## 2015-05-02 NOTE — Progress Notes (Signed)
CSW spoke with [facility] who confirms that the pt has been accepted to their hospital. She states that the pt is welcomed to the hospital tonight. However, an exact bed number is not available at this time.  Accepting Physician: Dr.Pucilowska Nurse Report Number: [phone]  Willette Brace [phone] ED CSW 05/02/2015 6:19 PM

## 2015-05-03 DIAGNOSIS — F332 Major depressive disorder, recurrent severe without psychotic features: Principal | ICD-10-CM

## 2015-05-03 LAB — T3: T3, Total: 117 ng/dL (ref 71–180)

## 2015-05-03 LAB — T4: T4, Total: 7.5 ug/dL (ref 4.5–12.0)

## 2015-05-03 MED ORDER — HYDROXYZINE HCL 25 MG PO TABS: 25.0000 mg | ORAL_TABLET | Freq: Four times a day (QID) | ORAL | Status: DC | PRN

## 2015-05-03 MED ORDER — ZOLPIDEM TARTRATE 5 MG PO TABS: 5.0000 mg | ORAL_TABLET | Freq: Every day | ORAL | Status: DC

## 2015-05-03 MED ORDER — NICOTINE 21 MG/24HR TD PT24: 21.0000 mg | MEDICATED_PATCH | Freq: Every day | TRANSDERMAL | Status: DC

## 2015-05-03 MED ORDER — ESCITALOPRAM OXALATE 10 MG PO TABS
20.0000 mg | ORAL_TABLET | Freq: Every day | ORAL | Status: DC
  Administered 2015-05-03: 20 mg via ORAL
  Filled 2015-05-03: qty 2

## 2015-05-03 MED ORDER — ESCITALOPRAM OXALATE 20 MG PO TABS: 20.0000 mg | ORAL_TABLET | Freq: Every day | ORAL | Status: DC

## 2015-05-03 MED ORDER — CLONAZEPAM 1 MG PO TABS
1.0000 mg | ORAL_TABLET | Freq: Once | ORAL | Status: AC
  Administered 2015-05-03: 1 mg via ORAL
  Filled 2015-05-03: qty 1

## 2015-05-03 MED ORDER — NICOTINE 10 MG IN INHA
1.0000 | RESPIRATORY_TRACT | Status: DC | PRN
Start: 2015-05-03 — End: 2015-05-03
  Filled 2015-05-03: qty 36

## 2015-05-03 MED ORDER — CLONAZEPAM 1 MG PO TABS: 1.0000 mg | ORAL_TABLET | Freq: Three times a day (TID) | ORAL | Status: DC

## 2015-05-03 MED ORDER — CLONAZEPAM 1 MG PO TABS: 1.0000 mg | ORAL_TABLET | Freq: Three times a day (TID) | ORAL | Status: DC | PRN

## 2015-05-03 MED ORDER — NICOTINE 21 MG/24HR TD PT24
21.0000 mg | MEDICATED_PATCH | Freq: Every day | TRANSDERMAL | Status: DC
  Administered 2015-05-03: 21 mg via TRANSDERMAL
  Filled 2015-05-03: qty 1

## 2015-05-03 MED ORDER — HYDROXYZINE HCL 50 MG PO TABS
50.0000 mg | ORAL_TABLET | Freq: Once | ORAL | Status: AC
  Administered 2015-05-03: 50 mg via ORAL
  Filled 2015-05-03: qty 1

## 2015-05-03 NOTE — Progress Notes (Signed)
Patient is 59 year old female admitted to the unit from [facility] for depression with SI. States she was recently started a new medication now having suicidal thoughts. Says she recently relocated from [place] to [place] to be closer to her daughter and grandchildren, pts works as Therapist, sports and starting feeling overwhlemed with the new job and moving to new area. PH/O Major depression d/o,  Anxiety, CAD, arthritis and insomnia. Calm and cooperative. C/o anxiety. Good eye contact. Neat and clean. Feedback provided to MD. Started on Ambien 5 mg and vistaril 50 mg. Tolerates PO medications well. Denies SI/HI. No AV/H noted. No c/o pain/discomfort noted.

## 2015-05-03 NOTE — Tx Team (Signed)
Initial Interdisciplinary Treatment Plan   PATIENT STRESSORS: Medication change or noncompliance   PATIENT STRENGTHS: Ability for insight Average or above average intelligence Communication skills Financial means General fund of knowledge Motivation for treatment/growth   PROBLEM LIST: Problem List/Patient Goals Date to be addressed Date deferred Reason deferred Estimated date of resolution  depression 05/02/2015     Suicidal ideation 05/02/2015                                                DISCHARGE CRITERIA:  Ability to meet basic life and health needs Adequate post-discharge living arrangements Verbal commitment to aftercare and medication compliance  PRELIMINARY DISCHARGE PLAN: Return to previous living arrangement Return to previous work or school arrangements  PATIENT/FAMIILY INVOLVEMENT: This treatment plan has been presented to and reviewed with the patient, Sandra Conrad, and/or family member.  The patient and family have been given the opportunity to ask questions and make suggestions.  Aleen Campi 05/03/2015, 12:19 AM

## 2015-05-03 NOTE — [facility] Group Notes (Signed)
[facility] LCSW Group Therapy  05/03/2015 4:13 PM  Type of Therapy:  Group Therapy  Participation Level:  Active  Participation Quality:  Attentive  Affect:  Depressed  Cognitive:  Alert  Insight:  Improving  Engagement in Therapy:  Improving  Modes of Intervention:  Discussion, Education, Socialization and Support  Summary of Progress/Problems: Emotional Regulation: Patients will identify both negative and positive emotions. They will discuss emotions they have difficulty regulating and how they impact their lives. Patients will be asked to identify healthy coping skills to combat unhealthy reactions to negative emotions.   Brithany expressed that she was anxious about being in the hospital. She discussed her reason for admission and how her depression is effecting her life.   [place] MSW, [place]  05/03/2015, 4:13 PM

## 2015-05-03 NOTE — [facility] Group Notes (Signed)
[facility] Group Notes:  (Nursing/MHT/Case Management/Adjunct)  Date:  05/03/2015  Time:  12:40 PM  Type of Therapy:  Psychoeducational Skills  Participation Level:  Active  Participation Quality:  Appropriate  Affect:  Appropriate  Cognitive:  Appropriate  Insight:  Appropriate  Engagement in Group:  Engaged  Modes of Intervention:  Discussion, Education and Support  Summary of Progress/Problems:  Sandra Conrad 05/03/2015, 12:40 PM

## 2015-05-03 NOTE — Plan of Care (Signed)
Problem: Diagnosis: Increased Risk For Suicide Attempt Goal: LTG-Patient Will Show Positive Response to Medication LTG (by discharge) : Patient will show positive response to medication and will participate in the development of the discharge plan.  Outcome: Progressing Patient

## 2015-05-03 NOTE — H&P (Signed)
Psychiatric Admission Assessment Adult  Patient Identification: Sandra Conrad MRN:  [id number] Date of Evaluation:  05/03/2015 Chief Complaint:  depression Principal Diagnosis: Major depressive disorder, recurrent severe without psychotic features Diagnosis:   Patient Active Problem List   Diagnosis Date Noted  . Major depressive disorder, recurrent severe without psychotic features [F33.2] 05/02/2015  . Suicidal ideation [R45.851]   . Abdominal pain [R10.9]   . Diarrhea [R19.7]    History of Present Illness::   Identifying data. Sandra Conrad is a 59 year old female with history of depression and anxiety.  Chief complaint. "I am fine now."  History of present illness. Sandra Conrad has a history of depression started 10 years ago at the time when she was divorcing her husband. She was prescribed Lexapro with excellent results. A few years later as she had another bout of depression after her mother passed away. She was again treated with Lexapro with excellent results. She relocated to [place] 3 years ago and has been treated with 50 mg of Zoloft by her primary provider. 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 This would mean that he will move in with her daughter her husband and 3 young children. The patient has been increasingly anxious. She complains of poor sleep, decreased appetite, anhedonia, feeling of guilt and hopelessness worthlessness, poor energy and concentration, social isolation. She spoke with her primary provider and her Zoloft was substituted by brintelix. The patient realizes that the combination of brintelix and the tramadol that she's been taking for years can cause serotonin syndrome. It made her even more anxious she stopped taking the tramadol and experienced more pain. On Monday as she had an urge to overdose on her medications  but called her daughter who brought her to the emergency room. She spent [place] before she was transferred to [facility]. At this point she denies thoughts of hurting herself or others. She denies psychotic symptoms. She denies symptoms suggestive of bipolar mania. As she feels anxious but has been able to handle it well. She denies alcohol or illicit substance use.  Past psychiatric history. She has never been hospitalized, no suicide attempts.  Family psychiatric history. Sister with bipolar. No completed suicides in the family.  Social history. She is an Therapist, sports. She relocated from [place] back to [place] following her family. She found it difficult to adjust to life in the [place]. She works as a Marine scientist in a rehabilitation facility but feels that dealing with 14 and patient's for 12 hours a day she no longer can handle. She is currently on FLMA with a plan to retire. Total Time spent with patient: 1 hour  Past Medical History:  Past Medical History  Diagnosis Date  . Hypercholesteremia   . Coronary artery disease   . Cancer   . Arthritis   . Anxiety   . Osteoporosis   . Insomnia   . Abdominal pain   . Diarrhea   . Depression     Past Surgical History  Procedure Laterality Date  . Cardiac catheterization    . Breast lumpectomy    . Hip surgery    . Wrist surgery    . Dilation and curettage of uterus     Family History:  Family History  Problem Relation Age of Onset  . Colon cancer Mother   . Heart  attack Father   . Bipolar disorder Sister    Social History:  History  Alcohol Use No     History  Drug Use  . Yes  . Special: Benzodiazepines    History   Social History  . Marital Status: Divorced    Spouse Name: N/A  . Number of Children: N/A  . Years of Education: N/A   Social History Main Topics  . Smoking status: Current Every Day Smoker -- 1.00 packs/day for 20 years    Types: Cigarettes  . Smokeless tobacco: Never Used  .  Alcohol Use: No  . Drug Use: Yes    Special: Benzodiazepines  . Sexual Activity: Not Currently   Other Topics Concern  . None   Social History Narrative   Additional Social History:                          Musculoskeletal: Strength & Muscle Tone: within normal limits Gait & Station: normal Patient leans: N/A  Psychiatric Specialty Exam: Physical Exam  Nursing note and vitals reviewed.   Review of Systems  Musculoskeletal: Positive for joint pain.  All other systems reviewed and are negative.   Blood pressure 91/68, pulse 94, temperature 98.6 F (37 C), temperature source Oral, resp. rate 20, height '5\' 6"'  (1.676 m), weight 72.576 kg (160 lb).Body mass index is 25.84 kg/(m^2).  See SRA.                                                  Sleep:  Number of Hours: 5.45   Risk to Self: Is patient at risk for suicide?: Yes Risk to Others:   Prior Inpatient Therapy:   Prior Outpatient Therapy:    Alcohol Screening: 1. How often do you have a drink containing alcohol?: Never 9. Have you or someone else been injured as a result of your drinking?: No 10. Has a relative or friend or a doctor or another health worker been concerned about your drinking or suggested you cut down?: No Alcohol Use Disorder Identification Test Final Score (AUDIT): 0 Brief Intervention: AUDIT score less than 7 or less-screening does not suggest unhealthy drinking-brief intervention not indicated  Allergies:   Allergies  Allergen Reactions  . Cefdinir Nausea Only and Other (See Comments)    Abdominal pain    Lab Results:  Results for orders placed or performed during the hospital encounter of 05/01/15 (from the past 48 hour(s))  Comprehensive metabolic panel     Status: Abnormal   Collection Time: 05/02/15 12:31 PM  Result Value Ref Range   Sodium 139 135 - 145 mmol/L   Potassium 4.3 3.5 - 5.1 mmol/L   Chloride 104 101 - 111 mmol/L   CO2 27 22 - 32 mmol/L    Glucose, Bld 95 65 - 99 mg/dL   BUN 16 6 - 20 mg/dL   Creatinine, Ser 0.63 0.44 - 1.00 mg/dL   Calcium 9.3 8.9 - 10.3 mg/dL   Total Protein 6.9 6.5 - 8.1 g/dL   Albumin 3.6 3.5 - 5.0 g/dL   AST 11 (L) 15 - 41 U/L   ALT 12 (L) 14 - 54 U/L   Alkaline Phosphatase 70 38 - 126 U/L   Total Bilirubin 0.4 0.3 - 1.2 mg/dL   GFR calc non Af Amer >60 >60  mL/min   GFR calc Af Amer >60 >60 mL/min    Comment: (NOTE) The eGFR has been calculated using the CKD EPI equation. This calculation has not been validated in all clinical situations. eGFR's persistently <60 mL/min signify possible Chronic Kidney Disease.    Anion gap 8 5 - 15  CBC with Differential/Platelet     Status: None   Collection Time: 05/02/15 12:31 PM  Result Value Ref Range   WBC 10.1 4.0 - 10.5 K/uL   RBC 4.48 3.87 - 5.11 MIL/uL   Hemoglobin 14.1 12.0 - 15.0 g/dL   HCT 44.1 36.0 - 46.0 %   MCV 98.4 78.0 - 100.0 fL   MCH 31.5 26.0 - 34.0 pg   MCHC 32.0 30.0 - 36.0 g/dL   RDW 13.2 11.5 - 15.5 %   Platelets 316 150 - 400 K/uL   Neutrophils Relative % 75 43 - 77 %   Neutro Abs 7.6 1.7 - 7.7 K/uL   Lymphocytes Relative 16 12 - 46 %   Lymphs Abs 1.6 0.7 - 4.0 K/uL   Monocytes Relative 7 3 - 12 %   Monocytes Absolute 0.8 0.1 - 1.0 K/uL   Eosinophils Relative 1 0 - 5 %   Eosinophils Absolute 0.1 0.0 - 0.7 K/uL   Basophils Relative 1 0 - 1 %   Basophils Absolute 0.1 0.0 - 0.1 K/uL  T3     Status: None   Collection Time: 05/02/15 12:31 PM  Result Value Ref Range   T3, Total 117 71 - 180 ng/dL    Comment: (NOTE) Performed At: [facility] [place], [place] [id number] Lindon Romp MD SA:[id number]   TSH     Status: None   Collection Time: 05/02/15 12:31 PM  Result Value Ref Range   TSH 2.052 0.350 - 4.500 uIU/mL  T4     Status: None   Collection Time: 05/02/15 12:31 PM  Result Value Ref Range   T4, Total 7.5 4.5 - 12.0 ug/dL    Comment: (NOTE) Performed At: [facility] [place], [place] [id number] Lindon Romp MD UR:[id number]   Urinalysis with microscopic     Status: Abnormal   Collection Time: 05/02/15  2:08 PM  Result Value Ref Range   Color, Urine YELLOW YELLOW   APPearance CLEAR CLEAR   Specific Gravity, Urine 1.015 1.005 - 1.030   pH 6.0 5.0 - 8.0   Glucose, UA NEGATIVE NEGATIVE mg/dL   Hgb urine dipstick NEGATIVE NEGATIVE   Bilirubin Urine NEGATIVE NEGATIVE   Ketones, ur NEGATIVE NEGATIVE mg/dL   Protein, ur NEGATIVE NEGATIVE mg/dL   Urobilinogen, UA 0.2 0.0 - 1.0 mg/dL   Nitrite NEGATIVE NEGATIVE   Leukocytes, UA MODERATE (A) NEGATIVE   WBC, UA 3-6 <3 WBC/hpf   Bacteria, UA RARE RARE   Squamous Epithelial / LPF MANY (A) RARE   Current Medications: Current Facility-Administered Medications  Medication Dose Route Frequency Provider Last Rate Last Dose  . alum & mag hydroxide-simeth (MAALOX/MYLANTA) 200-200-20 MG/5ML suspension 30 mL  30 mL Oral Q4H PRN Taela Charbonneau B Emely Fahy, MD      . clonazePAM (KLONOPIN) tablet 1 mg  1 mg Oral TID AC Jovian Lembcke B Kol Consuegra, MD      . clonazePAM (KLONOPIN) tablet 1 mg  1 mg Oral Once Brooks Kinnan B Ulises Wolfinger, MD      . escitalopram (LEXAPRO) tablet 20 mg  20 mg Oral Daily Clovis Fredrickson, MD      .  hydrOXYzine (ATARAX/VISTARIL) tablet 25 mg  25 mg Oral Q6H PRN Zebbie Ace B Meosha Castanon, MD      . magnesium hydroxide (MILK OF MAGNESIA) suspension 30 mL  30 mL Oral Daily PRN Quinterius Gaida B Odie Edmonds, MD      . nicotine (NICODERM CQ - dosed in mg/24 hours) patch 21 mg  21 mg Transdermal Daily Jerie Basford B Mansoor Hillyard, MD      . nicotine (NICOTROL) 10 MG inhaler 1 continuous puffing  1 continuous puffing Inhalation PRN Dewain Penning, MD      . zolpidem (AMBIEN) tablet 5 mg  5 mg Oral QHS Seferina Brokaw B Edmond Ginsberg, MD       PTA Medications: Prescriptions prior to admission  Medication Sig Dispense Refill Last Dose  . atorvastatin (LIPITOR) 40 MG tablet Take 40 mg by mouth daily.   05/02/2015 at Unknown time  .  clonazePAM (KLONOPIN) 1 MG tablet Take 1 mg by mouth 3 (three) times daily as needed for anxiety.   0 Past Week at Unknown time  . Vortioxetine HBr (BRINTELLIX) 5 MG TABS Take 5 mg by mouth daily.   05/02/2015 at Unknown time  . acetaminophen (TYLENOL) 500 MG tablet Take 1,000 mg by mouth every 6 (six) hours as needed for moderate pain.   Not Taking at Unknown time  . HYDROcodone-homatropine (HYCODAN) 5-1.5 MG/5ML syrup Take 5 mLs by mouth every 6 (six) hours as needed for cough. (Patient not taking: Reported on 12/06/2014) 120 mL 0 Not Taking at Unknown time  . ibuprofen (ADVIL,MOTRIN) 200 MG tablet Take 800 mg by mouth every 6 (six) hours as needed for pain.   Not Taking at Unknown time  . metoCLOPramide (REGLAN) 10 MG tablet Take 1 tablet (10 mg total) by mouth as directed. (Patient not taking: Reported on 05/01/2015) 2 tablet 0 Not Taking at Unknown time    Previous Psychotropic Medications: Yes   Substance Abuse History in the last 12 months:  No.    Consequences of Substance Abuse: NA  Results for orders placed or performed during the hospital encounter of 05/01/15 (from the past 72 hour(s))  Comprehensive metabolic panel     Status: Abnormal   Collection Time: 05/02/15 12:31 PM  Result Value Ref Range   Sodium 139 135 - 145 mmol/L   Potassium 4.3 3.5 - 5.1 mmol/L   Chloride 104 101 - 111 mmol/L   CO2 27 22 - 32 mmol/L   Glucose, Bld 95 65 - 99 mg/dL   BUN 16 6 - 20 mg/dL   Creatinine, Ser 0.63 0.44 - 1.00 mg/dL   Calcium 9.3 8.9 - 10.3 mg/dL   Total Protein 6.9 6.5 - 8.1 g/dL   Albumin 3.6 3.5 - 5.0 g/dL   AST 11 (L) 15 - 41 U/L   ALT 12 (L) 14 - 54 U/L   Alkaline Phosphatase 70 38 - 126 U/L   Total Bilirubin 0.4 0.3 - 1.2 mg/dL   GFR calc non Af Amer >60 >60 mL/min   GFR calc Af Amer >60 >60 mL/min    Comment: (NOTE) The eGFR has been calculated using the CKD EPI equation. This calculation has not been validated in all clinical situations. eGFR's persistently <60 mL/min  signify possible Chronic Kidney Disease.    Anion gap 8 5 - 15  CBC with Differential/Platelet     Status: None   Collection Time: 05/02/15 12:31 PM  Result Value Ref Range   WBC 10.1 4.0 - 10.5 K/uL   RBC 4.48 3.87 -  5.11 MIL/uL   Hemoglobin 14.1 12.0 - 15.0 g/dL   HCT 44.1 36.0 - 46.0 %   MCV 98.4 78.0 - 100.0 fL   MCH 31.5 26.0 - 34.0 pg   MCHC 32.0 30.0 - 36.0 g/dL   RDW 13.2 11.5 - 15.5 %   Platelets 316 150 - 400 K/uL   Neutrophils Relative % 75 43 - 77 %   Neutro Abs 7.6 1.7 - 7.7 K/uL   Lymphocytes Relative 16 12 - 46 %   Lymphs Abs 1.6 0.7 - 4.0 K/uL   Monocytes Relative 7 3 - 12 %   Monocytes Absolute 0.8 0.1 - 1.0 K/uL   Eosinophils Relative 1 0 - 5 %   Eosinophils Absolute 0.1 0.0 - 0.7 K/uL   Basophils Relative 1 0 - 1 %   Basophils Absolute 0.1 0.0 - 0.1 K/uL  T3     Status: None   Collection Time: 05/02/15 12:31 PM  Result Value Ref Range   T3, Total 117 71 - 180 ng/dL    Comment: (NOTE) Performed At: [facility] [place], [place] [id number] Lindon Romp MD BE:[id number]   TSH     Status: None   Collection Time: 05/02/15 12:31 PM  Result Value Ref Range   TSH 2.052 0.350 - 4.500 uIU/mL  T4     Status: None   Collection Time: 05/02/15 12:31 PM  Result Value Ref Range   T4, Total 7.5 4.5 - 12.0 ug/dL    Comment: (NOTE) Performed At: [facility] [place], [place] [id number] Lindon Romp MD [id number]   Urinalysis with microscopic     Status: Abnormal   Collection Time: 05/02/15  2:08 PM  Result Value Ref Range   Color, Urine YELLOW YELLOW   APPearance CLEAR CLEAR   Specific Gravity, Urine 1.015 1.005 - 1.030   pH 6.0 5.0 - 8.0   Glucose, UA NEGATIVE NEGATIVE mg/dL   Hgb urine dipstick NEGATIVE NEGATIVE   Bilirubin Urine NEGATIVE NEGATIVE   Ketones, ur NEGATIVE NEGATIVE mg/dL   Protein, ur NEGATIVE NEGATIVE mg/dL   Urobilinogen, UA 0.2 0.0 - 1.0 mg/dL   Nitrite NEGATIVE NEGATIVE    Leukocytes, UA MODERATE (A) NEGATIVE   WBC, UA 3-6 <3 WBC/hpf   Bacteria, UA RARE RARE   Squamous Epithelial / LPF MANY (A) RARE    Observation Level/Precautions:  15 minute checks  Laboratory:  CBC Chemistry Profile UDS UA  Psychotherapy:    Medications:    Consultations:    Discharge Concerns:    Estimated LOS:  Other:     Psychological Evaluations: No   Treatment Plan Summary: Daily contact with patient to assess and evaluate symptoms and progress in treatment and Medication management  Medical Decision Making:  New problem, with additional work up planned, Review of Psycho-Social Stressors (1), Review or order clinical lab tests (1), Review of Medication Regimen & Side Effects (2) and Review of New Medication or Change in Dosage (2)   Sandra Conrad is a 59 year old female with history of depression and anxiety admitting for worsening of depression and suicidal ideation in the context of recent medication adjustments.  1. Suicidal ideation. This has resolved. The patient is able to contract for safety.  2. Mood. The patient has been maintained on low-dose of Zoloft lately this was switched to brintelix. In the past at the patient did exceedingly well on 20 mg of Lexapro. We'll restart Lexapro.  3. Anxiety. At the  patient has been maintained on Klonopin 1 mg 3 times daily. Vistaril was added to her regimen in the hospital.  4. Insomnia. She does well on Ambien.  5. Chronic pain. This is after her hip replacement. The patient has been maintained on tramadol. She discontinued tramadol several weeks ago after she realized that she is running the risk of serotonin syndrome taking SSRI and tramadol at the same time. She reports that her pain is manageable.  6. Smoking. Nicotine products are available.   7. Disposition. She will be discharged to home with family. She will follow up with new psychiatrist.    I certify that inpatient services furnished can reasonably be expected to  improve the patient's condition.   Kavaughn Faucett 7/27/20163:34 PM

## 2015-05-03 NOTE — Progress Notes (Signed)
Recreation Therapy Notes  Date: 07.27.16 Time: 3:00 pm Location: Craft Room  Group Topic: Self-esteem  Goal Area(s) Addresses:  Patient will identify positive attributes about self. Patient will identify at least one coping skill.  Behavioral Response: Did not attend  Intervention: All About Me  Activity: Patients were instructed to make an "All About Me" pamphlet with their life's motto, positive traits, coping skills, and their healthy support system.  Education: LRT educated patients on ways to increase their self-esteem.   Education Outcome: Patient did not attend group.   Clinical Observations/Feedback: Patient did not attend group.  Leonette Monarch, LRT/CTRS 05/03/2015 4:18 PM

## 2015-05-03 NOTE — Plan of Care (Signed)
Problem: Diagnosis: Increased Risk For Suicide Attempt Goal: STG-Patient Will Report Suicidal Feelings to Staff Outcome: Not Met (add Reason) Pt calm and cooperative. No injuries noted. No voiced thoughts of hurting herself.  Will continue to  monitor and document verbal and behavior cues of escalating behaviors each shift until discharge.

## 2015-05-03 NOTE — BHH Suicide Risk Assessment (Signed)
[facility] Admission Suicide Risk Assessment   Nursing information obtained from:    Demographic factors:    Current Mental Status:    Loss Factors:    Historical Factors:    Risk Reduction Factors:    Total Time spent with patient: 1 hour Principal Problem: Major depressive disorder, recurrent severe without psychotic features Diagnosis:   Patient Active Problem List   Diagnosis Date Noted  . Major depressive disorder, recurrent severe without psychotic features [F33.2] 05/02/2015  . Suicidal ideation [R45.851]   . Abdominal pain [R10.9]   . Diarrhea [R19.7]      Continued Clinical Symptoms:  Alcohol Use Disorder Identification Test Final Score (AUDIT): 0 The "Alcohol Use Disorders Identification Test", Guidelines for Use in Primary Care, Second Edition.  World Pharmacologist [facility]). Score between 0-7:  no or low risk or alcohol related problems. Score between 8-15:  moderate risk of alcohol related problems. Score between 16-19:  high risk of alcohol related problems. Score 20 or above:  warrants further diagnostic evaluation for alcohol dependence and treatment.   CLINICAL FACTORS:   Severe Anxiety and/or Agitation Depression:   Insomnia Severe   Musculoskeletal: Strength & Muscle Tone: within normal limits Gait & Station: normal Patient leans: Right  Psychiatric Specialty Exam: Physical Exam  Constitutional: She is oriented to person, place, and time. She appears well-developed and well-nourished.  HENT:  Head: Normocephalic and atraumatic.  Eyes: Conjunctivae and EOM are normal. Pupils are equal, round, and reactive to light.  Neck: Normal range of motion.  Cardiovascular: Normal rate, regular rhythm and normal heart sounds.   Respiratory: Effort normal and breath sounds normal.  Musculoskeletal: Normal range of motion.  Neurological: She is alert and oriented to person, place, and time.  Skin: Skin is warm and dry.    Review of Systems  Musculoskeletal:  Positive for joint pain.  All other systems reviewed and are negative.   Blood pressure 91/68, pulse 94, temperature 98.6 F (37 C), temperature source Oral, resp. rate 20, height 5\' 6"  (1.676 m), weight 72.576 kg (160 lb).Body mass index is 25.84 kg/(m^2).  General Appearance: Casual  Eye Contact::  Good  Speech:  Normal Rate  Volume:  Normal  Mood:  Anxious  Affect:  Appropriate  Thought Process:  Goal Directed  Orientation:  Full (Time, Place, and Person)  Thought Content:  WDL  Suicidal Thoughts:  No  Homicidal Thoughts:  No  Memory:  Immediate;   Fair Recent;   Fair Remote;   Fair  Judgement:  Fair  Insight:  Fair  Psychomotor Activity:  Normal  Concentration:  Fair  Recall:  [employer] [place]  Language: Fair  Akathisia:  No  Handed:  Right  AIMS (if indicated):     Assets:  Communication Skills Desire for Improvement Financial Resources/Insurance Housing Physical Health Social Support  Sleep:  Number of Hours: 5.45  Cognition: WNL  ADL's:  Intact     COGNITIVE FEATURES THAT CONTRIBUTE TO RISK:  None    SUICIDE RISK:   Minimal: No identifiable suicidal ideation.  Patients presenting with no risk factors but with morbid ruminations; may be classified as minimal risk based on the severity of the depressive symptoms  PLAN OF CARE: Hospital admission, medication management, discharge planning.  Medical Decision Making:  New problem, with additional work up planned, Review of Psycho-Social Stressors (1), Review or order clinical lab tests (1), Review of Medication Regimen & Side Effects (2) and Review of New Medication or Change  in Dosage (2)   Sandra Conrad is a 59 year old female with history of depression and anxiety admitting for worsening of depression and suicidal ideation in the context of recent medication adjustments.  1. Suicidal ideation. This has resolved. The patient is able to contract for safety.  2. Mood. The patient has been maintained  on low-dose of Zoloft lately this was switched to brintelix. In the past at the patient did exceedingly well on 20 mg of Lexapro. We'll restart Lexapro.  3. Anxiety. At the patient has been maintained on Klonopin 1 mg 3 times daily. Vistaril was added to her regimen in the hospital.  4. Insomnia. She does well on Ambien.  5. Chronic pain. This is after her hip replacement. The patient has been maintained on tramadol. She discontinued tramadol several weeks ago after she realized that she is running the risk of serotonin syndrome taking SSRI and tramadol at the same time. She reports that her pain is manageable.  6. Smoking. Nicotine products are available.   7. Disposition. She will be discharged to home with family. She will follow up with new psychiatrist.   I certify that inpatient services furnished can reasonably be expected to improve the patient's condition.   Irianna Gilday 05/03/2015, 3:26 PM

## 2015-05-03 NOTE — Progress Notes (Signed)
Nurs Dischg Note:  D:Patient denies SI/HI at this time. Pt appears calm and cooperative, and no distress noted.  A No Personal items in locker. Writer instructed on discharge information aware of follow up appointments. Received prescriptions  Pt escorted out of the building.  R:  Pt States she will comply with outpatient services, and take MEDS as prescribed. Able to verbalize understanding of informations received. Escorted to exit  Patient daughter waiting

## 2015-05-04 NOTE — Progress Notes (Signed)
  [facility] Adult Case Management Discharge Plan :  Will you be returning to the same living situation after discharge:  Yes,  patient will return home with family At discharge, do you have transportation home?: Yes,  patient's daughter will pick up at discharge Do you have the ability to pay for your medications: Yes,  patient has [employer] of information consent forms completed and in the chart;  Patient's signature needed at discharge.  Patient to Follow up at: Follow-up Information    Go to [facility].   Why:  For follow-up care; Walk in hours are 8am -3pm M-F; then an appoint will be scheduled; patient will followup Thursday 05/04/15 at 8:00am   Contact information:   [place], [place] Ph [phone] Fax [phone] [address] [place], [place] Ph [phone] Fax [phone]       Follow up with [place]. Go in 70 days.   Why:  For follow-up care; Wednesday 07/12/15 at 8:00am   Contact information:   [place], [place]  Ph [phone] Fax [phone]      Patient denies SI/HI: Yes,  patient denies SI/HI    Safety Planning and Suicide Prevention discussed: Yes,  SPE discussed with patient and her daughter     Has patient been referred to the Quitline?: N/A patient is not a smoker  Keene Breath, MSW, LCSWA 05/04/2015, 9:11 AM

## 2015-05-04 NOTE — [facility] Suicide Risk Assessment (Signed)
[place] INPATIENT:  Family/Significant Other Suicide Prevention Education  Suicide Prevention Education:  Education Completed; Arva Chafe (daughter) [phone] has been identified by the patient as the family member/significant other with whom the patient will be residing, and identified as the person(s) who will aid the patient in the event of a mental health crisis (suicidal ideations/suicide attempt).  With written consent from the patient, the family member/significant other has been provided the following suicide prevention education, prior to the and/or following the discharge of the patient.  The suicide prevention education provided includes the following:  Suicide risk factors  Suicide prevention and interventions  National Suicide Hotline telephone number  [facility] assessment telephone number  [facility] Emergency Assistance [place] and/or Residential Mobile Crisis Unit telephone number  Request made of family/significant other to:  Remove weapons (e.g., guns, rifles, knives), all items previously/currently identified as safety concern.    Remove drugs/medications (over-the-counter, prescriptions, illicit drugs), all items previously/currently identified as a safety concern.  The family member/significant other verbalizes understanding of the suicide prevention education information provided.  The family member/significant other agrees to remove the items of safety concern listed above.  Keene Breath, MSW, LCSWA 05/04/2015, 9:10 AM

## 2015-05-04 NOTE — Progress Notes (Signed)
AVS H&P faxed to [facility] for hospital follow-up

## 2015-05-18 ENCOUNTER — Other Ambulatory Visit: Payer: Self-pay

## 2015-05-18 DIAGNOSIS — Z853 Personal history of malignant neoplasm of breast: Secondary | ICD-10-CM

## 2015-05-18 DIAGNOSIS — Z9889 Other specified postprocedural states: Secondary | ICD-10-CM

## 2015-05-18 DIAGNOSIS — Z1231 Encounter for screening mammogram for malignant neoplasm of breast: Secondary | ICD-10-CM

## 2015-05-24 ENCOUNTER — Ambulatory Visit
Admission: RE | Admit: 2015-05-24 | Discharge: 2015-05-24 | Disposition: A | Discharge status: Home / Self Care | Payer: 59 | Source: Ambulatory Visit

## 2015-05-24 DIAGNOSIS — Z9889 Other specified postprocedural states: Secondary | ICD-10-CM

## 2015-05-24 DIAGNOSIS — Z853 Personal history of malignant neoplasm of breast: Secondary | ICD-10-CM

## 2015-05-24 DIAGNOSIS — Z1231 Encounter for screening mammogram for malignant neoplasm of breast: Secondary | ICD-10-CM

## 2015-07-12 ENCOUNTER — Ambulatory Visit (HOSPITAL_COMMUNITY): Payer: Self-pay | Admitting: Psychiatry

## 2015-07-17 ENCOUNTER — Ambulatory Visit (HOSPITAL_COMMUNITY): Payer: Self-pay | Admitting: Psychiatry

## 2015-12-01 ENCOUNTER — Ambulatory Visit (INDEPENDENT_AMBULATORY_CARE_PROVIDER_SITE_OTHER)
Admission: RE | Admit: 2015-12-01 | Discharge: 2015-12-01 | Disposition: A | Discharge status: Home / Self Care | Payer: BLUE CROSS/BLUE SHIELD | Source: Ambulatory Visit | Attending: Internal Medicine | Admitting: Internal Medicine

## 2015-12-01 ENCOUNTER — Other Ambulatory Visit (INDEPENDENT_AMBULATORY_CARE_PROVIDER_SITE_OTHER): Payer: BLUE CROSS/BLUE SHIELD

## 2015-12-01 ENCOUNTER — Telehealth: Payer: Self-pay | Admitting: Internal Medicine

## 2015-12-01 DIAGNOSIS — R1031 Right lower quadrant pain: Secondary | ICD-10-CM

## 2015-12-01 LAB — COMPREHENSIVE METABOLIC PANEL
ALT: 18 U/L (ref 0–35)
AST: 15 U/L (ref 0–37)
Albumin: 4.3 g/dL (ref 3.5–5.2)
Alkaline Phosphatase: 65 U/L (ref 39–117)
BILIRUBIN TOTAL: 0.4 mg/dL (ref 0.2–1.2)
BUN: 10 mg/dL (ref 6–23)
CHLORIDE: 107 meq/L (ref 96–112)
CO2: 28 meq/L (ref 19–32)
Calcium: 9 mg/dL (ref 8.4–10.5)
Creatinine, Ser: 0.57 mg/dL (ref 0.40–1.20)
GFR: 114.49 mL/min (ref >60.00)
GLUCOSE: 88 mg/dL (ref 70–99)
Potassium: 3.7 mEq/L (ref 3.5–5.1)
SODIUM: 138 meq/L (ref 135–145)
Total Protein: 7 g/dL (ref 6.0–8.3)

## 2015-12-01 LAB — CBC WITH DIFFERENTIAL/PLATELET
BASOS ABS: 0 10*3/uL (ref 0.0–0.1)
Basophils Relative: 0.5 % (ref 0.0–3.0)
EOS PCT: 0.7 % (ref 0.0–5.0)
Eosinophils Absolute: 0 10*3/uL (ref 0.0–0.7)
HCT: 40.1 % (ref 36.0–46.0)
Hemoglobin: 13.5 g/dL (ref 12.0–15.0)
Lymphocytes Relative: 29.8 % (ref 12.0–46.0)
Lymphs Abs: 1.8 10*3/uL (ref 0.7–4.0)
MCHC: 33.6 g/dL (ref 30.0–36.0)
MCV: 94.7 fl (ref 78.0–100.0)
MONO ABS: 0.6 10*3/uL (ref 0.1–1.0)
Monocytes Relative: 10.1 % (ref 3.0–12.0)
Neutro Abs: 3.6 10*3/uL (ref 1.4–7.7)
Neutrophils Relative %: 58.9 % (ref 43.0–77.0)
Platelets: 317 10*3/uL (ref 150.0–400.0)
RBC: 4.23 Mil/uL (ref 3.87–5.11)
RDW: 13.7 % (ref 11.5–15.5)
WBC: 6.1 10*3/uL (ref 4.0–10.5)

## 2015-12-01 MED ORDER — IOHEXOL 300 MG/ML  SOLN
100.0000 mL | Freq: Once | INTRAMUSCULAR | Status: AC | PRN
  Administered 2015-12-01: 100 mL via INTRAVENOUS

## 2015-12-01 NOTE — Telephone Encounter (Signed)
Cbc, cmp CT abd/pelvis with contrast ASAP r/o diverticulitis and appendicitis

## 2015-12-01 NOTE — Telephone Encounter (Signed)
Left message for patient to call back  

## 2015-12-01 NOTE — Telephone Encounter (Signed)
Patient will come for STAT labs now then will head to [facility]. To begin drinking her contrast.  She verbalized understanding.

## 2015-12-01 NOTE — Telephone Encounter (Signed)
Patient reports right reports right lower quad pain for 5 days "just below my umbilicus" .  Pain is dull, constant, tender to the touch.  Her pain is worse in a supine position. Denies pain with ambulation.  Per procedure report she has diverticulosis of the ascending,descending and sigmoid colon.  Denies any other complaints nausea, vomiting, diarrhea, fever.

## 2015-12-06 ENCOUNTER — Telehealth: Payer: Self-pay | Admitting: Internal Medicine

## 2015-12-06 NOTE — Telephone Encounter (Signed)
[facility] available at [facility] should be okay

## 2015-12-06 NOTE — Telephone Encounter (Signed)
Pt calling for urology referral. Which urologist would you like her to be referred to? Please advise.

## 2015-12-08 NOTE — Telephone Encounter (Signed)
Pt scheduled to see Dr. Alyson Ingles with [facility] 12/29/15@2pm , pt to arrive there at 1:45pm. Records faxed to [phone]. Pt aware of appt.

## 2015-12-08 NOTE — Telephone Encounter (Signed)
Advised pt nurse should be calling back sometime today.

## 2016-01-09 DIAGNOSIS — F339 Major depressive disorder, recurrent, unspecified: Secondary | ICD-10-CM | POA: Diagnosis not present

## 2016-01-14 DIAGNOSIS — G43909 Migraine, unspecified, not intractable, without status migrainosus: Secondary | ICD-10-CM | POA: Diagnosis not present

## 2016-01-17 DIAGNOSIS — Z0000 Encounter for general adult medical examination without abnormal findings: Secondary | ICD-10-CM | POA: Diagnosis not present

## 2016-01-17 DIAGNOSIS — K458 Other specified abdominal hernia without obstruction or gangrene: Secondary | ICD-10-CM | POA: Diagnosis not present

## 2016-02-25 DIAGNOSIS — M25552 Pain in left hip: Secondary | ICD-10-CM | POA: Diagnosis not present

## 2016-02-25 DIAGNOSIS — M545 Low back pain: Secondary | ICD-10-CM | POA: Diagnosis not present

## 2016-02-27 DIAGNOSIS — M545 Low back pain: Secondary | ICD-10-CM | POA: Diagnosis not present

## 2016-04-25 DIAGNOSIS — F339 Major depressive disorder, recurrent, unspecified: Secondary | ICD-10-CM | POA: Diagnosis not present

## 2016-06-03 ENCOUNTER — Other Ambulatory Visit: Payer: Self-pay | Admitting: Internal Medicine

## 2016-06-03 DIAGNOSIS — Z1231 Encounter for screening mammogram for malignant neoplasm of breast: Secondary | ICD-10-CM

## 2016-07-02 ENCOUNTER — Ambulatory Visit
Admission: RE | Admit: 2016-07-02 | Discharge: 2016-07-02 | Disposition: A | Discharge status: Home / Self Care | Payer: BLUE CROSS/BLUE SHIELD | Source: Ambulatory Visit | Attending: Internal Medicine | Admitting: Internal Medicine

## 2016-07-02 DIAGNOSIS — Z1231 Encounter for screening mammogram for malignant neoplasm of breast: Secondary | ICD-10-CM | POA: Diagnosis not present

## 2016-07-04 DIAGNOSIS — Z23 Encounter for immunization: Secondary | ICD-10-CM | POA: Diagnosis not present

## 2016-08-07 ENCOUNTER — Telehealth: Payer: Self-pay | Admitting: Internal Medicine

## 2016-08-07 NOTE — Telephone Encounter (Signed)
Pt states she is having abdominal bloating and gas, discomfort all across her abdomen. Pt states this started after she ate something spicy on Saturday. Pt states she has taken omeprazole and gas-x and this has not helped. Offered pt an appt today, tomorrow and Friday. Pt states she cannot come today. States she will have to make arrangements for someone else to pick up her grandchildren and she will call back to schedule an appt for tomorrow or Friday.

## 2016-09-03 DIAGNOSIS — G43011 Migraine without aura, intractable, with status migrainosus: Secondary | ICD-10-CM | POA: Diagnosis not present

## 2016-09-03 DIAGNOSIS — J0110 Acute frontal sinusitis, unspecified: Secondary | ICD-10-CM | POA: Diagnosis not present

## 2016-09-11 DIAGNOSIS — M519 Unspecified thoracic, thoracolumbar and lumbosacral intervertebral disc disorder: Secondary | ICD-10-CM | POA: Diagnosis not present

## 2016-09-17 DIAGNOSIS — M545 Low back pain: Secondary | ICD-10-CM | POA: Diagnosis not present

## 2016-09-21 DIAGNOSIS — M545 Low back pain: Secondary | ICD-10-CM | POA: Diagnosis not present

## 2016-09-25 DIAGNOSIS — M5416 Radiculopathy, lumbar region: Secondary | ICD-10-CM | POA: Diagnosis not present

## 2016-10-02 DIAGNOSIS — E782 Mixed hyperlipidemia: Secondary | ICD-10-CM | POA: Diagnosis not present

## 2016-10-04 DIAGNOSIS — F3181 Bipolar II disorder: Secondary | ICD-10-CM | POA: Diagnosis not present

## 2016-10-04 DIAGNOSIS — E782 Mixed hyperlipidemia: Secondary | ICD-10-CM | POA: Diagnosis not present

## 2016-10-04 DIAGNOSIS — Z0001 Encounter for general adult medical examination with abnormal findings: Secondary | ICD-10-CM | POA: Diagnosis not present

## 2016-10-04 DIAGNOSIS — F411 Generalized anxiety disorder: Secondary | ICD-10-CM | POA: Diagnosis not present

## 2016-10-25 DIAGNOSIS — M545 Low back pain: Secondary | ICD-10-CM | POA: Diagnosis not present

## 2016-10-25 DIAGNOSIS — M5136 Other intervertebral disc degeneration, lumbar region: Secondary | ICD-10-CM | POA: Diagnosis not present

## 2016-11-11 DIAGNOSIS — M545 Low back pain: Secondary | ICD-10-CM | POA: Diagnosis not present

## 2017-04-08 DIAGNOSIS — Z0001 Encounter for general adult medical examination with abnormal findings: Secondary | ICD-10-CM | POA: Diagnosis not present

## 2017-04-08 DIAGNOSIS — Z1159 Encounter for screening for other viral diseases: Secondary | ICD-10-CM | POA: Diagnosis not present

## 2017-04-10 DIAGNOSIS — R000 Tachycardia, unspecified: Secondary | ICD-10-CM | POA: Diagnosis not present

## 2017-04-10 DIAGNOSIS — F411 Generalized anxiety disorder: Secondary | ICD-10-CM | POA: Diagnosis not present

## 2017-04-10 DIAGNOSIS — E782 Mixed hyperlipidemia: Secondary | ICD-10-CM | POA: Diagnosis not present

## 2017-04-10 DIAGNOSIS — F3181 Bipolar II disorder: Secondary | ICD-10-CM | POA: Diagnosis not present

## 2017-06-09 DIAGNOSIS — R300 Dysuria: Secondary | ICD-10-CM | POA: Diagnosis not present

## 2017-06-09 DIAGNOSIS — R350 Frequency of micturition: Secondary | ICD-10-CM | POA: Diagnosis not present

## 2017-06-09 DIAGNOSIS — N390 Urinary tract infection, site not specified: Secondary | ICD-10-CM | POA: Diagnosis not present

## 2017-06-27 ENCOUNTER — Other Ambulatory Visit: Payer: Self-pay | Admitting: Internal Medicine

## 2017-06-27 DIAGNOSIS — Z1231 Encounter for screening mammogram for malignant neoplasm of breast: Secondary | ICD-10-CM

## 2017-07-07 DIAGNOSIS — G43009 Migraine without aura, not intractable, without status migrainosus: Secondary | ICD-10-CM | POA: Diagnosis not present

## 2017-07-07 DIAGNOSIS — L989 Disorder of the skin and subcutaneous tissue, unspecified: Secondary | ICD-10-CM | POA: Diagnosis not present

## 2017-07-07 DIAGNOSIS — Z23 Encounter for immunization: Secondary | ICD-10-CM | POA: Diagnosis not present

## 2017-07-11 ENCOUNTER — Ambulatory Visit
Admission: RE | Admit: 2017-07-11 | Discharge: 2017-07-11 | Disposition: A | Discharge status: Home / Self Care | Payer: BLUE CROSS/BLUE SHIELD | Source: Ambulatory Visit | Attending: Internal Medicine | Admitting: Internal Medicine

## 2017-07-11 DIAGNOSIS — Z1231 Encounter for screening mammogram for malignant neoplasm of breast: Secondary | ICD-10-CM

## 2017-07-11 HISTORY — DX: Personal history of irradiation: Z92.3

## 2017-07-11 HISTORY — DX: Personal history of antineoplastic chemotherapy: Z92.21

## 2017-07-15 ENCOUNTER — Other Ambulatory Visit: Payer: Self-pay | Admitting: Internal Medicine

## 2017-07-15 DIAGNOSIS — R928 Other abnormal and inconclusive findings on diagnostic imaging of breast: Secondary | ICD-10-CM

## 2017-07-18 ENCOUNTER — Ambulatory Visit
Admission: RE | Admit: 2017-07-18 | Discharge: 2017-07-18 | Disposition: A | Discharge status: Home / Self Care | Payer: BLUE CROSS/BLUE SHIELD | Source: Ambulatory Visit | Attending: Internal Medicine | Admitting: Internal Medicine

## 2017-07-18 ENCOUNTER — Ambulatory Visit: Payer: Self-pay

## 2017-07-18 DIAGNOSIS — R928 Other abnormal and inconclusive findings on diagnostic imaging of breast: Secondary | ICD-10-CM

## 2017-07-21 DIAGNOSIS — L821 Other seborrheic keratosis: Secondary | ICD-10-CM | POA: Diagnosis not present

## 2017-07-21 DIAGNOSIS — L280 Lichen simplex chronicus: Secondary | ICD-10-CM | POA: Diagnosis not present

## 2017-08-11 DIAGNOSIS — G43009 Migraine without aura, not intractable, without status migrainosus: Secondary | ICD-10-CM | POA: Diagnosis not present

## 2017-08-25 DIAGNOSIS — G43009 Migraine without aura, not intractable, without status migrainosus: Secondary | ICD-10-CM | POA: Diagnosis not present

## 2017-09-26 DIAGNOSIS — M25552 Pain in left hip: Secondary | ICD-10-CM | POA: Diagnosis not present

## 2017-09-26 DIAGNOSIS — M545 Low back pain: Secondary | ICD-10-CM | POA: Diagnosis not present

## 2017-09-26 DIAGNOSIS — M25551 Pain in right hip: Secondary | ICD-10-CM | POA: Diagnosis not present

## 2017-10-16 DIAGNOSIS — M25551 Pain in right hip: Secondary | ICD-10-CM | POA: Diagnosis not present

## 2017-11-14 DIAGNOSIS — M545 Low back pain: Secondary | ICD-10-CM | POA: Diagnosis not present

## 2017-11-14 DIAGNOSIS — M25551 Pain in right hip: Secondary | ICD-10-CM | POA: Diagnosis not present

## 2017-11-18 ENCOUNTER — Other Ambulatory Visit: Payer: Self-pay | Admitting: Sports Medicine

## 2017-11-18 DIAGNOSIS — M545 Low back pain: Principal | ICD-10-CM

## 2017-11-18 DIAGNOSIS — G8929 Other chronic pain: Secondary | ICD-10-CM

## 2017-11-25 ENCOUNTER — Other Ambulatory Visit: Payer: Self-pay | Admitting: Sports Medicine

## 2017-11-25 ENCOUNTER — Ambulatory Visit
Admission: RE | Admit: 2017-11-25 | Discharge: 2017-11-25 | Disposition: A | Discharge status: Home / Self Care | Payer: BLUE CROSS/BLUE SHIELD | Source: Ambulatory Visit | Attending: Sports Medicine | Admitting: Sports Medicine

## 2017-11-25 DIAGNOSIS — M5126 Other intervertebral disc displacement, lumbar region: Secondary | ICD-10-CM | POA: Diagnosis not present

## 2017-11-25 DIAGNOSIS — G8929 Other chronic pain: Secondary | ICD-10-CM

## 2017-11-25 DIAGNOSIS — M545 Low back pain: Principal | ICD-10-CM

## 2017-11-25 MED ORDER — METHYLPREDNISOLONE ACETATE 40 MG/ML INJ SUSP (RADIOLOG
120.0000 mg | Freq: Once | INTRAMUSCULAR | Status: AC
  Administered 2017-11-25: 120 mg via EPIDURAL

## 2017-11-25 MED ORDER — IOPAMIDOL (ISOVUE-M 200) INJECTION 41%
1.0000 mL | Freq: Once | INTRAMUSCULAR | Status: AC
  Administered 2017-11-25: 1 mL via EPIDURAL

## 2017-11-25 NOTE — Discharge Instructions (Signed)

## 2017-12-26 DIAGNOSIS — G43009 Migraine without aura, not intractable, without status migrainosus: Secondary | ICD-10-CM | POA: Diagnosis not present

## 2017-12-26 DIAGNOSIS — G894 Chronic pain syndrome: Secondary | ICD-10-CM | POA: Diagnosis not present

## 2017-12-26 DIAGNOSIS — Z6827 Body mass index (BMI) 27.0-27.9, adult: Secondary | ICD-10-CM | POA: Diagnosis not present

## 2017-12-28 IMAGING — CT CT ABD-PELV W/ CM
2 of 5 series · 15 of 46 positions shown, 17 images · IV contrast (OMNIPAQUE 300)
Comparison: No priors.

CLINICAL DATA: 59-year-old female with right lower quadrant
tenderness since [REDACTED]. Evaluate for acute appendicitis. History of
diverticulosis. Evaluate for potential diverticulitis.

EXAM:
CT ABDOMEN AND PELVIS WITH CONTRAST
TECHNIQUE: Multidetector CT imaging of the abdomen and pelvis was performed
using the standard protocol following bolus administration of
intravenous contrast.
CONTRAST:  100mL OMNIPAQUE IOHEXOL 300 MG/ML  SOLN

[Series 2: abd/ pelvis · axial · 0.79mm/px · z∈[-436,-36]mm · 12 of 90 slices shown, 14 images]
[im 5/90  soft-tissue]
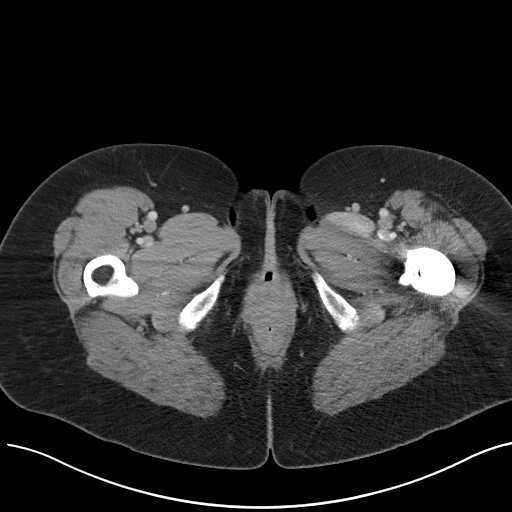
[im 5/90  bone]
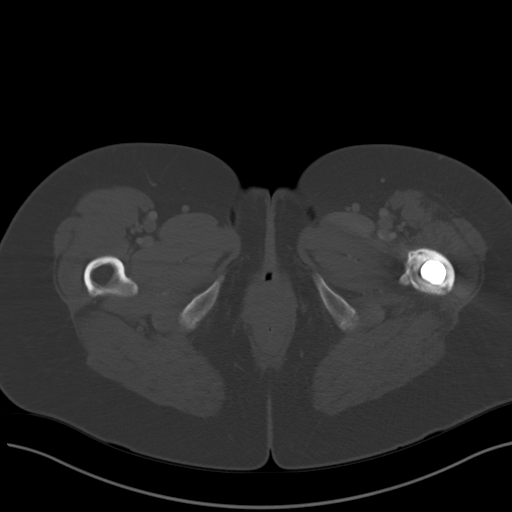
[im 15/90  soft-tissue]
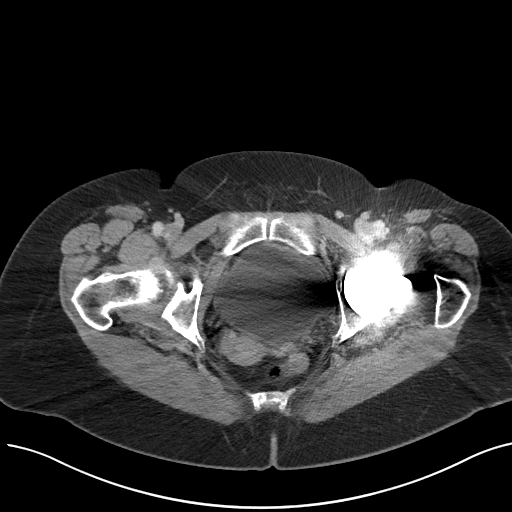
[im 19/90  soft-tissue]
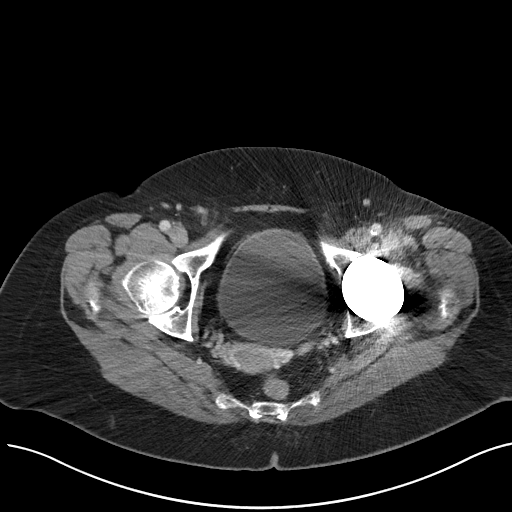
[im 29/90  soft-tissue]
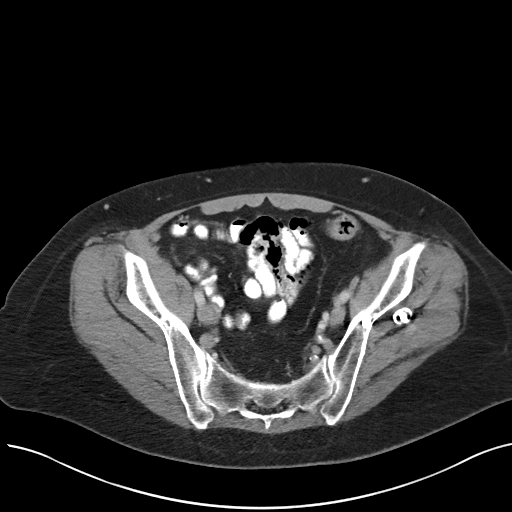
[im 33/90  soft-tissue]
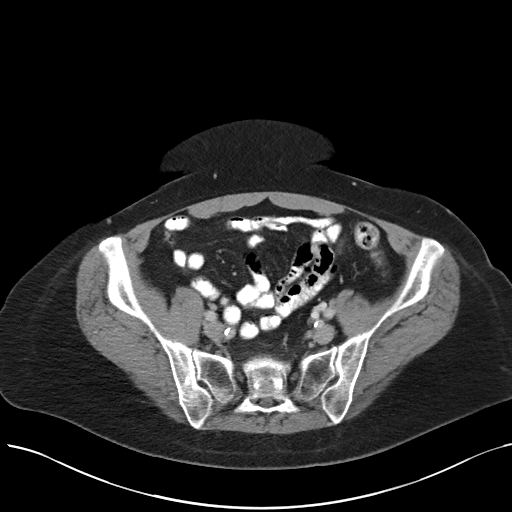
[im 43/90  soft-tissue]
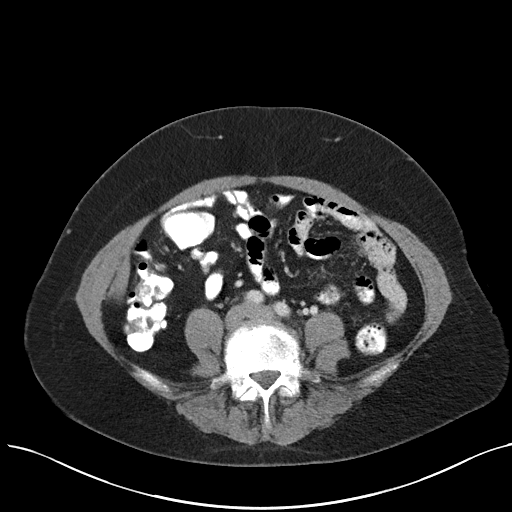
[im 47/90  soft-tissue]
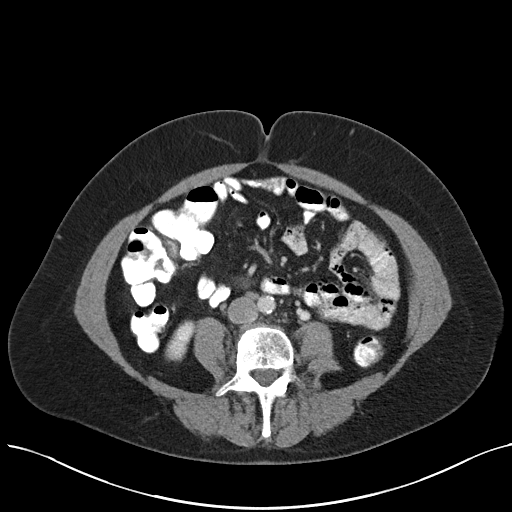
[im 57/90  soft-tissue]
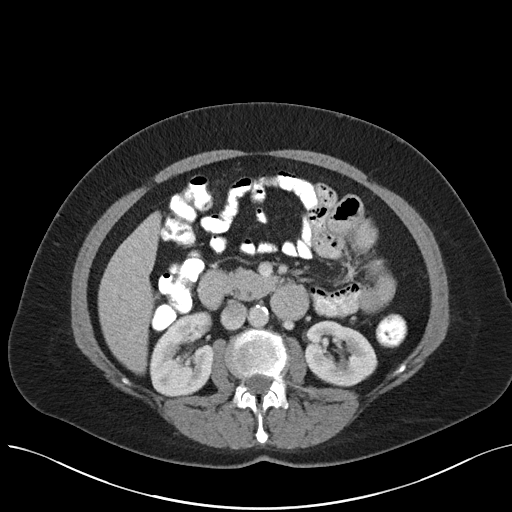
[im 61/90  soft-tissue]
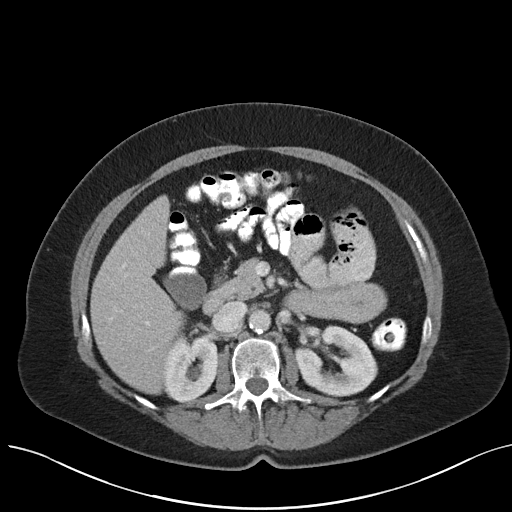
[im 61/90  bone]
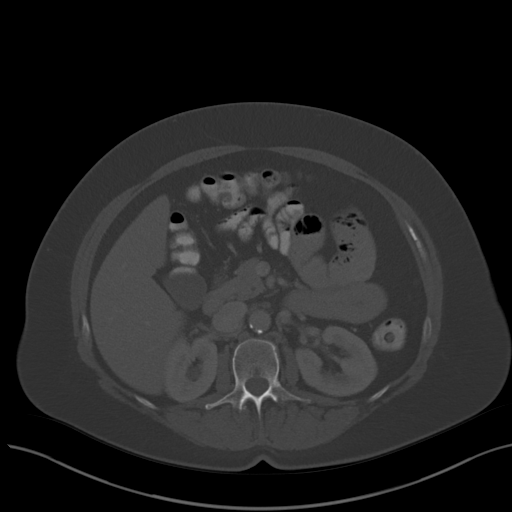
[im 71/90  soft-tissue]
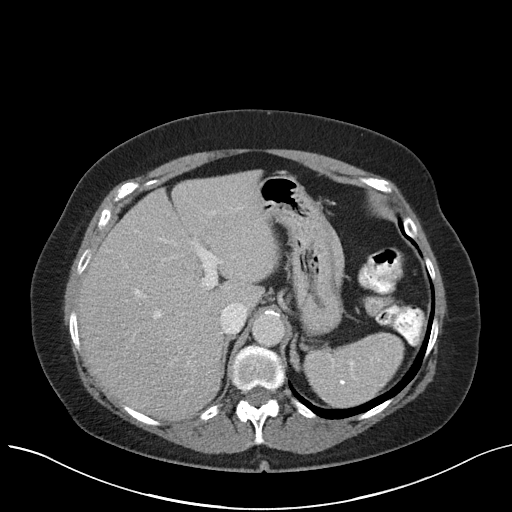
[im 75/90  soft-tissue]
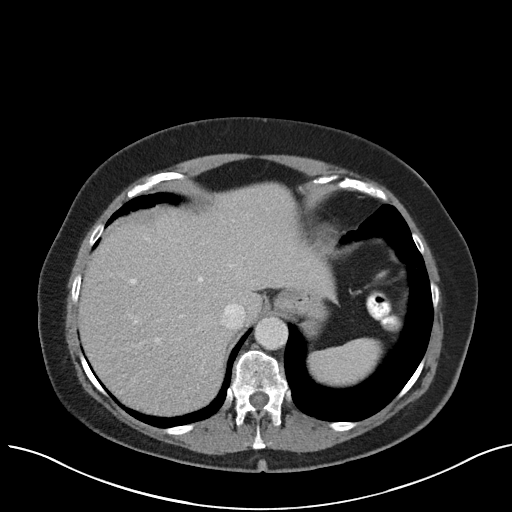
[im 85/90  soft-tissue]
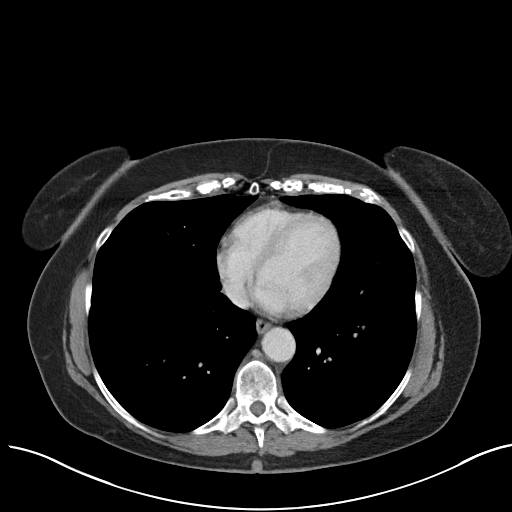

[Series 5: coronal soft tissue · coronal · 0.63mm/px · 3 of 86 slices shown]
[im 29/86  soft-tissue]
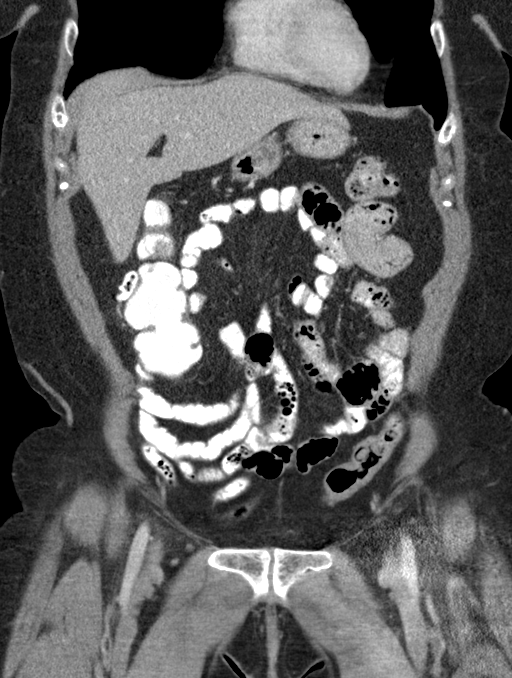
[im 38/86  soft-tissue]
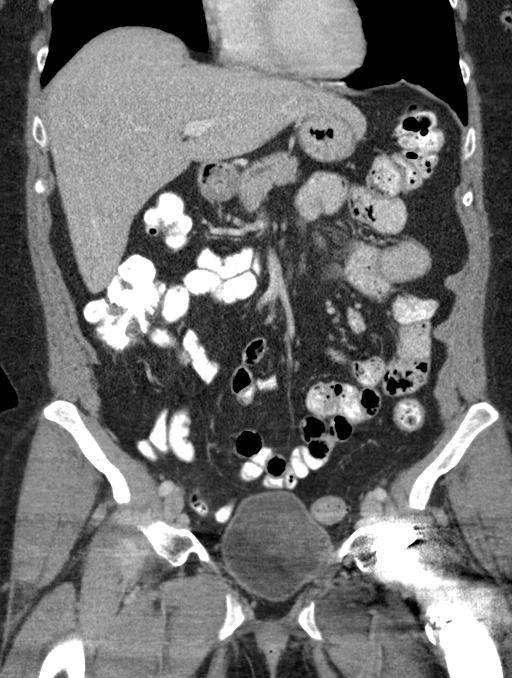
[im 48/86  soft-tissue]
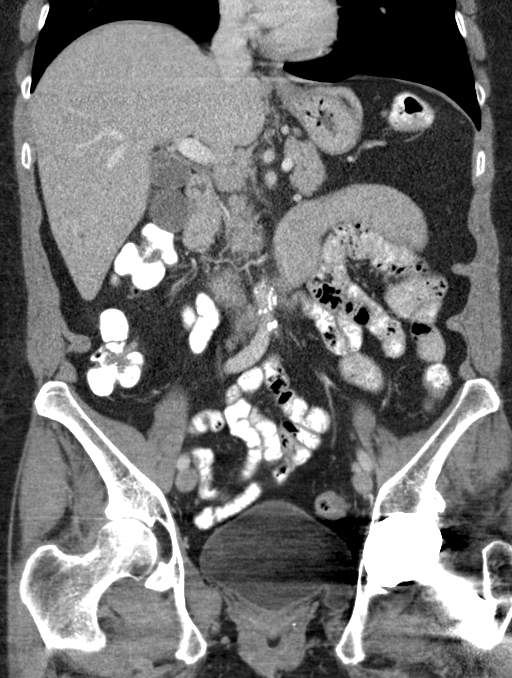

[15 of 46 positions shown; findings below may reference images not displayed]

FINDINGS: Lower chest:  Unremarkable.

Hepatobiliary: No cystic or solid hepatic lesions. No intra or
extrahepatic biliary ductal dilatation. Gallbladder is normal in
appearance.

Pancreas: No pancreatic mass. No pancreatic ductal dilatation. No
pancreatic or peripancreatic fluid or inflammatory changes.

Spleen: Numerous calcified granulomas in the spleen.

Adrenals/Urinary Tract: Bilateral kidneys and bilateral adrenal
glands are normal in appearance. No hydroureteronephrosis. Along the
left side of the urinary bladder the urinary bladder appears to have
partially herniated into the fascial planes along the left pelvic
side wall. Urinary bladder is otherwise unremarkable in appearance.

Stomach/Bowel: Of the appearance of the stomach is normal. No
pathologic dilatation of small bowel or colon. Numerous colonic
diverticulae are noted, without surrounding inflammatory changes to
suggest an acute diverticulitis at this time. Normal appendix.

Vascular/Lymphatic: Atherosclerosis throughout the abdominal and
pelvic vasculature, without evidence of aneurysm or dissection. No
lymphadenopathy noted in the abdomen or pelvis.

Reproductive: Uterus and ovaries are unremarkable in appearance.

Other: No significant volume of ascites.  No pneumoperitoneum.

Musculoskeletal: There are no aggressive appearing lytic or blastic
lesions noted in the visualized portions of the skeleton.
IMPRESSION: 1. No acute findings to account for the patient's history of right
lower quadrant abdominal pain. Specifically, the appendix is normal.
Additionally, although there is diverticular disease in the colon,
it is rather mild, and there is no associated diverticulitis is at
this time.
2. Small left-sided obturator hernia containing a portion of the
urinary bladder.
3. Additional incidental findings, as above.

## 2018-01-01 ENCOUNTER — Encounter: Payer: Self-pay | Admitting: Internal Medicine

## 2018-01-13 DIAGNOSIS — R55 Syncope and collapse: Secondary | ICD-10-CM | POA: Diagnosis not present

## 2018-05-07 DIAGNOSIS — G894 Chronic pain syndrome: Secondary | ICD-10-CM | POA: Diagnosis not present

## 2018-05-07 DIAGNOSIS — Z6827 Body mass index (BMI) 27.0-27.9, adult: Secondary | ICD-10-CM | POA: Diagnosis not present

## 2018-05-07 DIAGNOSIS — G43009 Migraine without aura, not intractable, without status migrainosus: Secondary | ICD-10-CM | POA: Diagnosis not present

## 2018-05-07 DIAGNOSIS — Z6828 Body mass index (BMI) 28.0-28.9, adult: Secondary | ICD-10-CM | POA: Diagnosis not present

## 2018-05-12 DIAGNOSIS — E782 Mixed hyperlipidemia: Secondary | ICD-10-CM | POA: Diagnosis not present

## 2018-05-12 DIAGNOSIS — G894 Chronic pain syndrome: Secondary | ICD-10-CM | POA: Diagnosis not present

## 2018-05-12 DIAGNOSIS — Z6827 Body mass index (BMI) 27.0-27.9, adult: Secondary | ICD-10-CM | POA: Diagnosis not present

## 2018-05-12 DIAGNOSIS — Z0001 Encounter for general adult medical examination with abnormal findings: Secondary | ICD-10-CM | POA: Diagnosis not present

## 2018-05-12 DIAGNOSIS — F3181 Bipolar II disorder: Secondary | ICD-10-CM | POA: Diagnosis not present

## 2018-05-12 DIAGNOSIS — Z1151 Encounter for screening for human papillomavirus (HPV): Secondary | ICD-10-CM | POA: Diagnosis not present

## 2018-05-12 DIAGNOSIS — Z124 Encounter for screening for malignant neoplasm of cervix: Secondary | ICD-10-CM | POA: Diagnosis not present

## 2018-05-18 ENCOUNTER — Encounter: Payer: Self-pay | Admitting: Internal Medicine

## 2018-05-21 DIAGNOSIS — H109 Unspecified conjunctivitis: Secondary | ICD-10-CM | POA: Diagnosis not present

## 2018-06-16 DIAGNOSIS — M5106 Intervertebral disc disorders with myelopathy, lumbar region: Secondary | ICD-10-CM | POA: Diagnosis not present

## 2018-06-16 DIAGNOSIS — Z23 Encounter for immunization: Secondary | ICD-10-CM | POA: Diagnosis not present

## 2018-06-24 DIAGNOSIS — M545 Low back pain: Secondary | ICD-10-CM | POA: Diagnosis not present

## 2018-06-24 DIAGNOSIS — M5106 Intervertebral disc disorders with myelopathy, lumbar region: Secondary | ICD-10-CM | POA: Diagnosis not present

## 2018-07-02 ENCOUNTER — Ambulatory Visit (AMBULATORY_SURGERY_CENTER): Payer: Self-pay

## 2018-07-02 ENCOUNTER — Encounter: Payer: Self-pay | Admitting: Internal Medicine

## 2018-07-02 VITALS — Ht 65.0 in | Wt 173.0 lb

## 2018-07-02 DIAGNOSIS — Z800 Family history of malignant neoplasm of digestive organs: Secondary | ICD-10-CM

## 2018-07-02 DIAGNOSIS — Z86010 Personal history of colonic polyps: Secondary | ICD-10-CM

## 2018-07-02 MED ORDER — NA SULFATE-K SULFATE-MG SULF 17.5-3.13-1.6 GM/177ML PO SOLN: 1.0000 | Freq: Once | ORAL | 0 refills | Status: AC

## 2018-07-02 NOTE — Progress Notes (Signed)
Per pt, no allergies to soy or egg products.Pt not taking any weight loss meds or using  O2 at home.  Pt refused emmi video. 

## 2018-07-10 ENCOUNTER — Other Ambulatory Visit: Payer: Self-pay | Admitting: Internal Medicine

## 2018-07-10 DIAGNOSIS — Z1231 Encounter for screening mammogram for malignant neoplasm of breast: Secondary | ICD-10-CM

## 2018-07-16 ENCOUNTER — Telehealth: Payer: Self-pay | Admitting: Internal Medicine

## 2018-07-16 ENCOUNTER — Telehealth: Payer: Self-pay | Admitting: Gastroenterology

## 2018-07-16 ENCOUNTER — Encounter: Payer: Self-pay | Admitting: Internal Medicine

## 2018-07-16 NOTE — Telephone Encounter (Signed)
Oncall Note:  Patient called twice last night.   First around 9pm that she threw up all the suprep and hasnt had a bowel movement. I advised her to do split dose Miralax prep along with Dulcolax instead. 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 She has Reglan but doesn't want to take it.  She called back at 12:30 AM saying she threw up Miralax prep as well and hasnt had a BM yet. Informed her that we will need to reschedule the procedure and discuss alternative preps at pre visit. She wanted to know if she will be charged for late cancellation. Asked her to call office in the AM.

## 2018-07-16 NOTE — Telephone Encounter (Signed)
Thanks Dr. Silverio Decamp DNS please facilitate repeat previsit Will need change of prep and likely 10 mg reglan before each half of prep

## 2018-07-16 NOTE — Telephone Encounter (Signed)
See additional phone note. 

## 2018-07-16 NOTE — Telephone Encounter (Signed)
I have spoken to patient who states that she began vomiting Suprep and was advised by on call physician to take Miralax prep. Unfortunately she vomited that as well. Gave herself enema as well as senna stool softners but did not begin having bowel movements till 4:00 am this morning. Patient states that she actually would like to try Miralax prep again with reglan because she thinks the only reason she couldn't tolerate the Miralax is because she was already sick from the [place]. Patient has rescheduled procedure to 08/06/18 and has previsit scheduled for 07/28/18.

## 2018-07-16 NOTE — Telephone Encounter (Signed)
Pt was supposed to get procedure done today at 8:00am but had a really rough time with prep wants to discuss with nurse.

## 2018-07-21 DIAGNOSIS — M545 Low back pain: Secondary | ICD-10-CM | POA: Diagnosis not present

## 2018-07-23 DIAGNOSIS — M5126 Other intervertebral disc displacement, lumbar region: Secondary | ICD-10-CM | POA: Diagnosis not present

## 2018-07-28 ENCOUNTER — Ambulatory Visit (AMBULATORY_SURGERY_CENTER): Payer: Self-pay

## 2018-07-28 ENCOUNTER — Encounter: Payer: Self-pay | Admitting: Internal Medicine

## 2018-07-28 VITALS — Ht 66.0 in | Wt 175.0 lb

## 2018-07-28 DIAGNOSIS — Z86010 Personal history of colonic polyps: Secondary | ICD-10-CM

## 2018-07-28 MED ORDER — METOCLOPRAMIDE HCL 10 MG PO TABS: 10.0000 mg | ORAL_TABLET | Freq: Once | ORAL | 0 refills | Status: DC

## 2018-07-28 NOTE — Progress Notes (Signed)
Denies allergies to eggs or soy products. Denies complication of anesthesia or sedation. Denies use of weight loss medication. Denies use of O2.   Emmi instructions declined.   Reglan 10 mg, 2 tablets were sent to the patients pharmacy. Patient was instructed how to take Reglan prior to the prep. Patient verbalizes understanding.

## 2018-08-06 ENCOUNTER — Encounter: Payer: Self-pay | Admitting: Internal Medicine

## 2018-08-06 ENCOUNTER — Ambulatory Visit (AMBULATORY_SURGERY_CENTER): Payer: BLUE CROSS/BLUE SHIELD | Admitting: Internal Medicine

## 2018-08-06 VITALS — BP 126/70 | HR 75 | Temp 97.5°F | Resp 13 | Ht 66.0 in | Wt 175.0 lb

## 2018-08-06 DIAGNOSIS — K621 Rectal polyp: Secondary | ICD-10-CM | POA: Diagnosis not present

## 2018-08-06 DIAGNOSIS — Z86010 Personal history of colonic polyps: Secondary | ICD-10-CM | POA: Diagnosis not present

## 2018-08-06 DIAGNOSIS — D128 Benign neoplasm of rectum: Secondary | ICD-10-CM

## 2018-08-06 DIAGNOSIS — D123 Benign neoplasm of transverse colon: Secondary | ICD-10-CM | POA: Diagnosis not present

## 2018-08-06 DIAGNOSIS — D120 Benign neoplasm of cecum: Secondary | ICD-10-CM

## 2018-08-06 MED ORDER — SODIUM CHLORIDE 0.9 % IV SOLN: 500.0000 mL | Freq: Once | INTRAVENOUS | Status: DC

## 2018-08-06 NOTE — Patient Instructions (Signed)
YOU HAD AN ENDOSCOPIC PROCEDURE TODAY AT THE [facility]:   Refer to the procedure report that was given to you for any specific questions about what was found during the examination.  If the procedure report does not answer your questions, please call your gastroenterologist to clarify.  If you requested that your care partner not be given the details of your procedure findings, then the procedure report has been included in a sealed envelope for you to review at your convenience later.  YOU SHOULD EXPECT: Some feelings of bloating in the abdomen. Passage of more gas than usual.  Walking can help get rid of the air that was put into your GI tract during the procedure and reduce the bloating. If you had a lower endoscopy (such as a colonoscopy or flexible sigmoidoscopy) you may notice spotting of blood in your stool or on the toilet paper. If you underwent a bowel prep for your procedure, you may not have a normal bowel movement for a few days.  Please Note:  You might notice some irritation and congestion in your nose or some drainage.  This is from the oxygen used during your procedure.  There is no need for concern and it should clear up in a day or so.  SYMPTOMS TO REPORT IMMEDIATELY:   Following lower endoscopy (colonoscopy or flexible sigmoidoscopy):  Excessive amounts of blood in the stool  Significant tenderness or worsening of abdominal pains  Swelling of the abdomen that is new, acute  Fever of 100F or higher    For urgent or emergent issues, a gastroenterologist can be reached at any hour by calling [phone].   DIET:  We do recommend a small meal at first, but then you may proceed to your regular diet.  Drink plenty of fluids but you should avoid alcoholic beverages for 24 hours.  ACTIVITY:  You should plan to take it easy for the rest of today and you should NOT DRIVE or use heavy machinery until tomorrow (because of the sedation medicines used during the test).     FOLLOW UP: Our staff will call the number listed on your records the next business day following your procedure to check on you and address any questions or concerns that you may have regarding the information given to you following your procedure. If we do not reach you, we will leave a message.  However, if you are feeling well and you are not experiencing any problems, there is no need to return our call.  We will assume that you have returned to your regular daily activities without incident.  If any biopsies were taken you will be contacted by phone or by letter within the next 1-3 weeks.  Please call us at [phone] if you have not heard about the biopsies in 3 weeks.    SIGNATURES/CONFIDENTIALITY: You and/or your care partner have signed paperwork which will be entered into your electronic medical record.  These signatures attest to the fact that that the information above on your After Visit Summary has been reviewed and is understood.  Full responsibility of the confidentiality of this discharge information lies with you and/or your care-partner.    Handouts were given to your care partner on polyps and diverticulosis. You may resume your current medications today. Await biopsy results. Please call if any questions or concerns.   

## 2018-08-06 NOTE — Progress Notes (Signed)
Pt's states no medical or surgical changes since previsit or office visit. 

## 2018-08-06 NOTE — Op Note (Signed)
[facility] Patient Name: Sandra Conrad Procedure Date: 08/06/2018 12:07 PM MRN: [id number] Endoscopist: Jerene Bears , MD Age: 62 Referring MD:  Date of Birth: 1956/09/25 Gender: Female Account #: [id number] Procedure:                Colonoscopy Indications:              Surveillance: Personal history of adenomatous                            polyps on last colonoscopy 3 years ago, Family                            history of colon cancer in a first-degree relative Medicines:                Monitored Anesthesia Care Procedure:                Pre-Anesthesia Assessment:                           - Prior to the procedure, a History and Physical                            was performed, and patient medications and                            allergies were reviewed. The patient's tolerance of                            previous anesthesia was also reviewed. The risks                            and benefits of the procedure and the sedation                            options and risks were discussed with the patient.                            All questions were answered, and informed consent                            was obtained. Prior Anticoagulants: The patient has                            taken no previous anticoagulant or antiplatelet                            agents. ASA Grade Assessment: II - A patient with                            mild systemic disease. After reviewing the risks                            and benefits, the patient was deemed in  satisfactory condition to undergo the procedure.                           After obtaining informed consent, the colonoscope                            was passed under direct vision. Throughout the                            procedure, the patient's blood pressure, pulse, and                            oxygen saturations were monitored continuously. The                            Model PCF-H190DL  [id number]) scope was introduced                            through the anus and advanced to the cecum,                            identified by appendiceal orifice and ileocecal                            valve. The colonoscopy was performed without                            difficulty. The patient tolerated the procedure                            well. The quality of the bowel preparation was                            good. The ileocecal valve, appendiceal orifice, and                            rectum were photographed. Scope In: 12:13:01 PM Scope Out: 12:30:03 PM Scope Withdrawal Time: 0 hours 12 minutes 37 seconds  Total Procedure Duration: 0 hours 17 minutes 2 seconds  Findings:                 The digital rectal exam was normal.                           A 3 mm polyp was found in the cecum. The polyp was                            sessile. The polyp was removed with a cold snare.                            Resection and retrieval were complete.                           Three sessile polyps were found in the transverse  colon. The polyps were 2 to 4 mm in size. These                            polyps were removed with a cold snare. Resection                            and retrieval were complete.                           A 5 mm polyp was found in the rectum. The polyp was                            sessile. The polyp was removed with a cold snare.                            Resection and retrieval were complete.                           Multiple small and large-mouthed diverticula were                            found in the sigmoid colon and descending colon.                           The retroflexed view of the distal rectum and anal                            verge was normal and showed no anal or rectal                            abnormalities. Complications:            No immediate complications. Estimated Blood Loss:     Estimated blood loss  was minimal. Impression:               - One 3 mm polyp in the cecum, removed with a cold                            snare. Resected and retrieved.                           - Three 2 to 4 mm polyps in the transverse colon,                            removed with a cold snare. Resected and retrieved.                           - One 5 mm polyp in the rectum, removed with a cold                            snare. Resected and retrieved.                           -  Moderate diverticulosis in the sigmoid colon and                            in the descending colon.                           - The distal rectum and anal verge are normal on                            retroflexion view. Recommendation:           - Patient has a contact number available for                            emergencies. The signs and symptoms of potential                            delayed complications were discussed with the                            patient. Return to normal activities tomorrow.                            Written discharge instructions were provided to the                            patient.                           - Resume previous diet.                           - Continue present medications.                           - Await pathology results.                           - Repeat colonoscopy is recommended for                            surveillance. The colonoscopy date will be                            determined after pathology results from today's                            exam become available for review. Jerene Bears, MD 08/06/2018 12:34:38 PM This report has been signed electronically.

## 2018-08-06 NOTE — Progress Notes (Signed)
Report given to PACU, vss 

## 2018-08-06 NOTE — Progress Notes (Signed)
Called to room to assist during endoscopic procedure.  Patient ID and intended procedure confirmed with present staff. Received instructions for my participation in the procedure from the performing physician.  

## 2018-08-07 ENCOUNTER — Telehealth: Payer: Self-pay

## 2018-08-07 NOTE — Telephone Encounter (Signed)
  Follow up Call-  Call back number 08/06/2018  Post procedure Call Back phone  # [phone]  Permission to leave phone message Yes  Some recent data might be hidden     Patient questions:  Do you have a fever, pain , or abdominal swelling? No. Pain Score  0 *  Have you tolerated food without any problems? Yes.    Have you been able to return to your normal activities? Yes.    Do you have any questions about your discharge instructions: Diet   No. Medications  No. Follow up visit  No.  Do you have questions or concerns about your Care? No.  Actions: * If pain score is 4 or above: No action needed, pain <4.

## 2018-08-11 ENCOUNTER — Encounter: Payer: Self-pay | Admitting: Internal Medicine

## 2018-08-11 DIAGNOSIS — F3181 Bipolar II disorder: Secondary | ICD-10-CM | POA: Diagnosis not present

## 2018-08-11 DIAGNOSIS — M1610 Unilateral primary osteoarthritis, unspecified hip: Secondary | ICD-10-CM | POA: Diagnosis not present

## 2018-08-11 DIAGNOSIS — G43009 Migraine without aura, not intractable, without status migrainosus: Secondary | ICD-10-CM | POA: Diagnosis not present

## 2018-08-12 ENCOUNTER — Ambulatory Visit
Admission: RE | Admit: 2018-08-12 | Discharge: 2018-08-12 | Disposition: A | Discharge status: Home / Self Care | Payer: BLUE CROSS/BLUE SHIELD | Source: Ambulatory Visit | Attending: Internal Medicine | Admitting: Internal Medicine

## 2018-08-12 DIAGNOSIS — Z1231 Encounter for screening mammogram for malignant neoplasm of breast: Secondary | ICD-10-CM | POA: Diagnosis not present

## 2018-08-14 DIAGNOSIS — Z1231 Encounter for screening mammogram for malignant neoplasm of breast: Secondary | ICD-10-CM | POA: Diagnosis not present

## 2018-08-21 DIAGNOSIS — M48062 Spinal stenosis, lumbar region with neurogenic claudication: Secondary | ICD-10-CM | POA: Diagnosis not present

## 2018-08-21 DIAGNOSIS — M5126 Other intervertebral disc displacement, lumbar region: Secondary | ICD-10-CM | POA: Diagnosis not present

## 2018-08-21 HISTORY — PX: BACK SURGERY: SHX140

## 2018-10-06 DIAGNOSIS — Z6829 Body mass index (BMI) 29.0-29.9, adult: Secondary | ICD-10-CM | POA: Diagnosis not present

## 2018-10-06 DIAGNOSIS — M5126 Other intervertebral disc displacement, lumbar region: Secondary | ICD-10-CM | POA: Diagnosis not present

## 2018-10-06 DIAGNOSIS — I10 Essential (primary) hypertension: Secondary | ICD-10-CM | POA: Diagnosis not present

## 2018-10-06 DIAGNOSIS — M7061 Trochanteric bursitis, right hip: Secondary | ICD-10-CM | POA: Diagnosis not present

## 2018-11-25 DIAGNOSIS — E782 Mixed hyperlipidemia: Secondary | ICD-10-CM | POA: Diagnosis not present

## 2018-12-02 DIAGNOSIS — F411 Generalized anxiety disorder: Secondary | ICD-10-CM | POA: Diagnosis not present

## 2018-12-02 DIAGNOSIS — F3181 Bipolar II disorder: Secondary | ICD-10-CM | POA: Diagnosis not present

## 2018-12-02 DIAGNOSIS — E782 Mixed hyperlipidemia: Secondary | ICD-10-CM | POA: Diagnosis not present

## 2018-12-02 DIAGNOSIS — Z0000 Encounter for general adult medical examination without abnormal findings: Secondary | ICD-10-CM | POA: Diagnosis not present

## 2019-01-06 DIAGNOSIS — R300 Dysuria: Secondary | ICD-10-CM | POA: Diagnosis not present

## 2019-02-09 DIAGNOSIS — M1611 Unilateral primary osteoarthritis, right hip: Secondary | ICD-10-CM | POA: Diagnosis not present

## 2019-02-15 DIAGNOSIS — M1611 Unilateral primary osteoarthritis, right hip: Secondary | ICD-10-CM | POA: Diagnosis not present

## 2019-03-10 DIAGNOSIS — G43009 Migraine without aura, not intractable, without status migrainosus: Secondary | ICD-10-CM | POA: Diagnosis not present

## 2019-03-10 DIAGNOSIS — F3181 Bipolar II disorder: Secondary | ICD-10-CM | POA: Diagnosis not present

## 2019-03-10 DIAGNOSIS — M1990 Unspecified osteoarthritis, unspecified site: Secondary | ICD-10-CM | POA: Diagnosis not present

## 2019-03-10 DIAGNOSIS — F411 Generalized anxiety disorder: Secondary | ICD-10-CM | POA: Diagnosis not present

## 2019-03-24 DIAGNOSIS — M545 Low back pain: Secondary | ICD-10-CM | POA: Diagnosis not present

## 2019-03-24 DIAGNOSIS — M25551 Pain in right hip: Secondary | ICD-10-CM | POA: Diagnosis not present

## 2019-03-25 ENCOUNTER — Other Ambulatory Visit: Payer: Self-pay | Admitting: Orthopedic Surgery

## 2019-03-25 DIAGNOSIS — M1611 Unilateral primary osteoarthritis, right hip: Secondary | ICD-10-CM | POA: Diagnosis not present

## 2019-03-27 ENCOUNTER — Other Ambulatory Visit (HOSPITAL_COMMUNITY)
Admission: RE | Admit: 2019-03-27 | Discharge: 2019-03-27 | Disposition: A | Discharge status: Home / Self Care | Payer: BC Managed Care – PPO | Source: Ambulatory Visit | Attending: Orthopedic Surgery | Admitting: Orthopedic Surgery

## 2019-03-27 DIAGNOSIS — Z1159 Encounter for screening for other viral diseases: Secondary | ICD-10-CM | POA: Insufficient documentation

## 2019-03-27 LAB — SARS CORONAVIRUS 2 (TAT 6-24 HRS): SARS Coronavirus 2: NEGATIVE

## 2019-03-29 NOTE — Patient Instructions (Addendum)
MAYRANI KHAMIS  03/29/2019   Your procedure is scheduled on: Wednesday 03/31/2019   Report to [facility] Main  Entrance              Report to admitting at  [place] 03-27-19. PLEASE CONTINUE THE QUARANTINE INSTRUCTIONS AS OUTLINED IN YOUR HANDOUT.   Call this number if you have problems the morning of surgery [phone]    Remember: Do not eat food  :After Midnight.                 NO SOLID FOOD AFTER MIDNIGHT THE NIGHT PRIOR TO SURGERY. NOTHING BY MOUTH EXCEPT CLEAR LIQUIDS UNTIL 3 HOURS PRIOR TO [place] SURGERY.                PLEASE FINISH ENSURE DRINK PER SURGEON ORDER 3 HOURS PRIOR TO SCHEDULED SURGERY TIME WHICH NEEDS TO BE COMPLETED AT   0850 am.   CLEAR LIQUID DIET   Foods Allowed                                                                     Foods Excluded  Coffee and tea, regular and decaf                             liquids that you cannot  Plain Jell-O in any flavor                                             see through such as: Fruit ices (not with fruit pulp)                                     milk, soups, orange juice  Iced Popsicles                                    All solid food Carbonated beverages, regular and diet                                    Cranberry, grape and apple juices Sports drinks like Gatorade Lightly seasoned clear broth or consume(fat free) Sugar, honey syrup  Sample Menu Breakfast                                Lunch                                     Supper Cranberry juice                    Beef broth  Chicken broth Jell-O                                     Grape juice                           Apple juice Coffee or tea                        Jell-O                                      Popsicle                                                Coffee or tea                        Coffee or  tea  _____________________________________________________________________                BRUSH YOUR TEETH MORNING OF SURGERY AND RINSE YOUR MOUTH OUT, NO CHEWING GUM CANDY OR MINTS.     Take these medicines the morning of surgery with A SIP OF WATER: Gabapentin (Neurontin), Lamotrigine (Lamictal)                                 You may not have any metal on your body including hair pins and              piercings     Do not wear jewelry, make-up, lotions, powders or perfumes, deodorant              Do not wear nail polish.  Do not shave  48 hours prior to surgery.                 Do not bring valuables to the hospital. [place].  Contacts, dentures or bridgework may not be worn into surgery.                   Please read over the following fact sheets you were given: _____________________________________________________________________             [facility] - Preparing for Surgery Before surgery, you can play an important role.  Because skin is not sterile, your skin needs to be as free of germs as possible.  You can reduce the number of germs on your skin by washing with CHG (chlorahexidine gluconate) soap before surgery.  CHG is an antiseptic cleaner which kills germs and bonds with the skin to continue killing germs even after washing. Please DO NOT use if you have an allergy to CHG or antibacterial soaps.  If your skin becomes reddened/irritated stop using the CHG and inform your nurse when you arrive at Short Stay. Do not shave (including legs and underarms) for at least 48 hours prior to the first CHG shower.  You may shave your face/neck. Please follow these instructions carefully:  1.  Shower with CHG Soap the night before surgery and the  morning of Surgery.  2.  If you choose to wash your hair, wash your hair first as usual with your  normal  shampoo.  3.  After you shampoo, rinse your hair and body thoroughly to  remove the  shampoo.                           4.  Use CHG as you would any other liquid soap.  You can apply chg directly  to the skin and wash                       Gently with a scrungie or clean washcloth.  5.  Apply the CHG Soap to your body ONLY FROM THE NECK DOWN.   Do not use on face/ open                           Wound or open sores. Avoid contact with eyes, ears mouth and genitals (private parts).                       Wash face,  Genitals (private parts) with your normal soap.             6.  Wash thoroughly, paying special attention to the area where your surgery  will be performed.  7.  Thoroughly rinse your body with warm water from the neck down.  8.  DO NOT shower/wash with your normal soap after using and rinsing off  the CHG Soap.                9.  Pat yourself dry with a clean towel.            10.  Wear clean pajamas.            11.  Place clean sheets on your bed the night of your first shower and do not  sleep with pets. Day of Surgery : Do not apply any lotions/deodorants the morning of surgery.  Please wear clean clothes to the hospital/surgery center.  FAILURE TO FOLLOW THESE INSTRUCTIONS MAY RESULT IN THE CANCELLATION OF YOUR SURGERY PATIENT SIGNATURE_________________________________  NURSE SIGNATURE__________________________________  ________________________________________________________________________   Adam Phenix  An incentive spirometer is a tool that can help keep your lungs clear and active. This tool measures how well you are filling your lungs with each breath. Taking long deep breaths may help reverse or decrease the chance of developing breathing (pulmonary) problems (especially infection) following:  A long period of time when you are unable to move or be active. BEFORE THE PROCEDURE   If the spirometer includes an indicator to show your best effort, your nurse or respiratory therapist will set it to a desired goal.  If possible, sit  up straight or lean slightly forward. Try not to slouch.  Hold the incentive spirometer in an upright position. INSTRUCTIONS FOR USE  1. Sit on the edge of your bed if possible, or sit up as far as you can in bed or on a chair. 2. Hold the incentive spirometer in an upright position. 3. Breathe out normally. 4. Place the mouthpiece in your mouth and seal your lips tightly around it. 5. Breathe in slowly and as deeply as possible, raising the piston or the ball toward the top of the column. 6.  Hold your breath for 3-5 seconds or for as long as possible. Allow the piston or ball to fall to the bottom of the column. 7. Remove the mouthpiece from your mouth and breathe out normally. 8. Rest for a few seconds and repeat Steps 1 through 7 at least 10 times every 1-2 hours when you are awake. Take your time and take a few normal breaths between deep breaths. 9. The spirometer may include an indicator to show your best effort. Use the indicator as a goal to work toward during each repetition. 10. After each set of 10 deep breaths, practice coughing to be sure your lungs are clear. If you have an incision (the cut made at the time of surgery), support your incision when coughing by placing a pillow or rolled up towels firmly against it. Once you are able to get out of bed, walk around indoors and cough well. You may stop using the incentive spirometer when instructed by your caregiver.  RISKS AND COMPLICATIONS  Take your time so you do not get dizzy or light-headed.  If you are in pain, you may need to take or ask for pain medication before doing incentive spirometry. It is harder to take a deep breath if you are having pain. AFTER USE  Rest and breathe slowly and easily.  It can be helpful to keep track of a log of your progress. Your caregiver can provide you with a simple table to help with this. If you are using the spirometer at home, follow these instructions: [place] IF:   You are  having difficultly using the spirometer.  You have trouble using the spirometer as often as instructed.  Your pain medication is not giving enough relief while using the spirometer.  You develop fever of 100.5 F (38.1 C) or higher. SEEK IMMEDIATE MEDICAL CARE IF:   You cough up bloody sputum that had not been present before.  You develop fever of 102 F (38.9 C) or greater.  You develop worsening pain at or near the incision site. MAKE SURE YOU:   Understand these instructions.  Will watch your condition.  Will get help right away if you are not doing well or get worse. Document Released: 02/03/2007 Document Revised: 12/16/2011 Document Reviewed: 04/06/2007 Exit[employer] Patient Information 2014 Exit[employer], [place].   ________________________________________________________________________  WHAT IS A BLOOD TRANSFUSION? Blood Transfusion Information  A transfusion is the replacement of blood or some of its parts. Blood is made up of multiple cells which provide different functions.  Red blood cells carry oxygen and are used for blood loss replacement.  White blood cells fight against infection.  Platelets control bleeding.  Plasma helps clot blood.  Other blood products are available for specialized needs, such as hemophilia or other clotting disorders. BEFORE THE TRANSFUSION  Who gives blood for transfusions?   Healthy volunteers who are fully evaluated to make sure their blood is safe. This is blood bank blood. Transfusion therapy is the safest it has ever been in the practice of medicine. Before blood is taken from a donor, a complete history is taken to make sure that person has no history of diseases nor engages in risky social behavior (examples are intravenous drug use or sexual activity with multiple partners). The donor's travel history is screened to minimize risk of transmitting infections, such as malaria. The donated blood is tested for signs of infectious diseases,  such as HIV and hepatitis. The blood is then tested to be sure it is  compatible with you in order to minimize the chance of a transfusion reaction. If you or a relative donates blood, this is often done in anticipation of surgery and is not appropriate for emergency situations. It takes many days to process the donated blood. RISKS AND COMPLICATIONS Although transfusion therapy is very safe and saves many lives, the main dangers of transfusion include:   Getting an infectious disease.  Developing a transfusion reaction. This is an allergic reaction to something in the blood you were given. Every precaution is taken to prevent this. The decision to have a blood transfusion has been considered carefully by your caregiver before blood is given. Blood is not given unless the benefits outweigh the risks. AFTER THE TRANSFUSION  Right after receiving a blood transfusion, you will usually feel much better and more energetic. This is especially true if your red blood cells have gotten low (anemic). The transfusion raises the level of the red blood cells which carry oxygen, and this usually causes an energy increase.  The nurse administering the transfusion will monitor you carefully for complications. HOME CARE INSTRUCTIONS  No special instructions are needed after a transfusion. You may find your energy is better. Speak with your caregiver about any limitations on activity for underlying diseases you may have. SEEK MEDICAL CARE IF:   Your condition is not improving after your transfusion.  You develop redness or irritation at the intravenous (IV) site. SEEK IMMEDIATE MEDICAL CARE IF:  Any of the following symptoms occur over the next 12 hours:  Shaking chills.  You have a temperature by mouth above 102 F (38.9 C), not controlled by medicine.  Chest, back, or muscle pain.  People around you feel you are not acting correctly or are confused.  Shortness of breath or difficulty  breathing.  Dizziness and fainting.  You get a rash or develop hives.  You have a decrease in urine output.  Your urine turns a dark color or changes to pink, red, or brown. Any of the following symptoms occur over the next 10 days:  You have a temperature by mouth above 102 F (38.9 C), not controlled by medicine.  Shortness of breath.  Weakness after normal activity.  The white part of the eye turns yellow (jaundice).  You have a decrease in the amount of urine or are urinating less often.  Your urine turns a dark color or changes to pink, red, or brown. Document Released: 09/20/2000 Document Revised: 12/16/2011 Document Reviewed: 05/09/2008 [facility] Patient Information 2014 [place], [place].  _______________________________________________________________________

## 2019-03-30 ENCOUNTER — Encounter (HOSPITAL_COMMUNITY)
Admission: RE | Admit: 2019-03-30 | Discharge: 2019-03-30 | Disposition: A | Discharge status: Home / Self Care | Payer: BC Managed Care – PPO | Source: Ambulatory Visit | Attending: Orthopedic Surgery | Admitting: Orthopedic Surgery

## 2019-03-30 ENCOUNTER — Other Ambulatory Visit: Payer: Self-pay

## 2019-03-30 ENCOUNTER — Ambulatory Visit (HOSPITAL_COMMUNITY)
Admission: RE | Admit: 2019-03-30 | Discharge: 2019-03-30 | Disposition: A | Discharge status: Home / Self Care | Payer: BC Managed Care – PPO | Source: Ambulatory Visit | Attending: Orthopedic Surgery | Admitting: Orthopedic Surgery

## 2019-03-30 ENCOUNTER — Encounter (HOSPITAL_COMMUNITY): Payer: Self-pay

## 2019-03-30 DIAGNOSIS — Z01818 Encounter for other preprocedural examination: Secondary | ICD-10-CM | POA: Diagnosis not present

## 2019-03-30 DIAGNOSIS — M87059 Idiopathic aseptic necrosis of unspecified femur: Secondary | ICD-10-CM | POA: Diagnosis present

## 2019-03-30 HISTORY — DX: Other complications of anesthesia, initial encounter: T88.59XA

## 2019-03-30 HISTORY — DX: Other specified postprocedural states: R11.2

## 2019-03-30 HISTORY — DX: Diverticulosis of intestine, part unspecified, without perforation or abscess without bleeding: K57.90

## 2019-03-30 HISTORY — DX: Other specified postprocedural states: Z98.890

## 2019-03-30 LAB — URINALYSIS, ROUTINE W REFLEX MICROSCOPIC
Bilirubin Urine: NEGATIVE
Glucose, UA: NEGATIVE mg/dL
Hgb urine dipstick: NEGATIVE
Ketones, ur: NEGATIVE mg/dL
Leukocytes,Ua: NEGATIVE
Nitrite: NEGATIVE
Protein, ur: NEGATIVE mg/dL
Specific Gravity, Urine: 1.008 (ref 1.005–1.030)
pH: 5 (ref 5.0–8.0)

## 2019-03-30 LAB — BASIC METABOLIC PANEL
Anion gap: 10 (ref 5–15)
BUN: 11 mg/dL (ref 8–23)
CO2: 27 mmol/L (ref 22–32)
Calcium: 9.4 mg/dL (ref 8.9–10.3)
Chloride: 103 mmol/L (ref 98–111)
Creatinine, Ser: 0.57 mg/dL (ref 0.44–1.00)
GFR calc Af Amer: >60 mL/min (ref >60)
GFR calc non Af Amer: >60 mL/min (ref >60)
Glucose, Bld: 92 mg/dL (ref 70–99)
Potassium: 4 mmol/L (ref 3.5–5.1)
Sodium: 140 mmol/L (ref 135–145)

## 2019-03-30 LAB — CBC WITH DIFFERENTIAL/PLATELET
Abs Immature Granulocytes: 0.03 10*3/uL (ref 0.00–0.07)
Basophils Absolute: 0 10*3/uL (ref 0.0–0.1)
Basophils Relative: 1 %
Eosinophils Absolute: 0.1 10*3/uL (ref 0.0–0.5)
Eosinophils Relative: 1 %
HCT: 43 % (ref 36.0–46.0)
Hemoglobin: 13.7 g/dL (ref 12.0–15.0)
Immature Granulocytes: 0 %
Lymphocytes Relative: 29 %
Lymphs Abs: 2.1 10*3/uL (ref 0.7–4.0)
MCH: 32.9 pg (ref 26.0–34.0)
MCHC: 31.9 g/dL (ref 30.0–36.0)
MCV: 103.4 fL — ABNORMAL HIGH (ref 80.0–100.0)
Monocytes Absolute: 0.6 10*3/uL (ref 0.1–1.0)
Monocytes Relative: 9 %
Neutro Abs: 4.2 10*3/uL (ref 1.7–7.7)
Neutrophils Relative %: 60 %
Platelets: 355 10*3/uL (ref 150–400)
RBC: 4.16 MIL/uL (ref 3.87–5.11)
RDW: 13.2 % (ref 11.5–15.5)
WBC: 7 10*3/uL (ref 4.0–10.5)
nRBC: 0 % (ref 0.0–0.2)

## 2019-03-30 LAB — ABO/RH: ABO/RH(D): B POS

## 2019-03-30 LAB — SURGICAL PCR SCREEN
MRSA, PCR: NEGATIVE
Staphylococcus aureus: NEGATIVE

## 2019-03-30 LAB — APTT: aPTT: 25 seconds (ref 24–36)

## 2019-03-30 LAB — PROTIME-INR
INR: 0.9 (ref 0.8–1.2)
Prothrombin Time: 11.6 seconds (ref 11.4–15.2)

## 2019-03-30 MED ORDER — TRANEXAMIC ACID 1000 MG/10ML IV SOLN
2000.0000 mg | INTRAVENOUS | Status: DC
  Filled 2019-03-30: qty 20

## 2019-03-30 MED ORDER — BUPIVACAINE LIPOSOME 1.3 % IJ SUSP
10.0000 mL | Freq: Once | INTRAMUSCULAR | Status: DC
  Filled 2019-03-30: qty 10

## 2019-03-30 NOTE — H&P (Signed)
TOTAL HIP ADMISSION H&P  Patient is admitted for right total hip arthroplasty.  Subjective:  Chief Complaint: right hip pain  HPI: Sandra Conrad, 63 y.o. female, has a history of pain and functional disability in the right hip(s) due to arthritis and AVN and patient has failed non-surgical conservative treatments for greater than 12 weeks to include corticosteriod injections, use of assistive devices and activity modification.  Onset of symptoms was abrupt starting 1 years ago with rapidlly worsening course since that time.The patient noted no past surgery on the right hip(s).  Patient currently rates pain in the right hip at 10 out of 10 with activity. Patient has night pain, worsening of pain with activity and weight bearing, trendelenberg gait, pain that interfers with activities of daily living and pain with passive range of motion. Patient has evidence of joint space narrowing and collapse of femoral head by imaging studies. This condition presents safety issues increasing the risk of falls.   There is no current active infection.  Patient Active Problem List   Diagnosis Date Noted  . Major depressive disorder, recurrent severe without psychotic features ([place]) 05/02/2015  . Suicidal ideation   . Abdominal pain   . Diarrhea    Past Medical History:  Diagnosis Date  . Abdominal pain    1 time in last 2 years ago/  . Anxiety   . Arthritis   . Blood transfusion without reported diagnosis 2005   1 time  . Cancer [facility]) 2011   Right breast IDC  . Complication of anesthesia    Pt request scopolamine patch  . Coronary artery disease    cardiac cath -15 % blockage  . Depression   . Diarrhea    last time in 2013  . Diverticulosis   . GERD (gastroesophageal reflux disease)   . Insomnia   . Lumbar herniated disc    L3-4 and L5  . Lumbar nerve root impingement    right leg  . OA (osteoarthritis)    right hip and spine  . Osteoporosis   . Personal history of chemotherapy    oral/on meds for 4 years  . Personal history of radiation therapy 2008   had 35 treatments/ last on 2008  . PONV (postoperative nausea and vomiting)     Past Surgical History:  Procedure Laterality Date  . BACK SURGERY  08/21/2018   L4-L5 and laminectomy  . BREAST EXCISIONAL BIOPSY Right 2008  . BREAST LUMPECTOMY  2008   right breast  . CARDIAC CATHETERIZATION  2012   no stents/no blockages  . DILATION AND CURETTAGE OF UTERUS  1982  . HIP SURGERY  2005   left hip x 2 ( pt report that first hip failed, revision was needed)  . WRIST SURGERY  2007   right wrist    Current Facility-Administered Medications  Medication Dose Route Frequency Provider Last Rate Last Dose  . [START ON 03/31/2019] bupivacaine liposome (EXPAREL) 1.3 % injection 133 mg  10 mL Other Once Frederik Pear, MD      . Derrill Memo ON 03/31/2019] tranexamic acid (CYKLOKAPRON) 2,000 mg in sodium chloride 0.9 % 50 mL Topical Application  0,086 mg Topical To OR Frederik Pear, MD       Current Outpatient Medications  Medication Sig Dispense Refill Last Dose  . acetaminophen (TYLENOL) 500 MG tablet Take 500 mg by mouth every 6 (six) hours as needed for moderate pain.      Marland Kitchen atorvastatin (LIPITOR) 20 MG tablet Take 20 mg  by mouth daily.     . clonazePAM (KLONOPIN) 1 MG tablet Take 1 tablet (1 mg total) by mouth 3 (three) times daily as needed for anxiety. (Patient taking differently: Take 1 mg by mouth 2 (two) times daily as needed for anxiety. ) 90 tablet 0   . gabapentin (NEURONTIN) 300 MG capsule Take 300 mg by mouth 3 (three) times daily.     Marland Kitchen HYDROcodone-acetaminophen (NORCO/VICODIN) 5-325 MG tablet Take 1 tablet by mouth every 6 (six) hours as needed for moderate pain.     Marland Kitchen lamoTRIgine (LAMICTAL) 150 MG tablet Take 150 mg by mouth daily.     . meloxicam (MOBIC) 15 MG tablet Take 15 mg by mouth daily.     . metoCLOPramide (REGLAN) 10 MG tablet Take 1 tablet (10 mg total) by mouth once for 1 dose. Take Reglan 30 minutes prior to  beginning each prep. (Patient not taking: Reported on 03/25/2019) 2 tablet 0 Not Taking at Unknown time   Allergies  Allergen Reactions  . Cefdinir Nausea Only and Other (See Comments)    Abdominal pain     Social History   Tobacco Use  . Smoking status: Light Tobacco Smoker    Years: 20.00    Types: Cigarettes  . Smokeless tobacco: Never Used  . Tobacco comment: smokes 10 cigarettes a day  Substance Use Topics  . Alcohol use: No    Family History  Problem Relation Age of Onset  . Colon cancer Mother   . Heart attack Father   . Bipolar disorder Sister   . Colon polyps Sister   . Heart disease Brother   . Colon polyps Brother   . Transient ischemic attack Brother   . Esophageal cancer Neg Hx   . Stomach cancer Neg Hx   . Rectal cancer Neg Hx      Review of Systems  Constitutional: Negative.   HENT: Negative.   Eyes: Negative.   Respiratory: Negative.   Cardiovascular: Negative.   Gastrointestinal: Negative.   Genitourinary: Negative.   Musculoskeletal: Positive for joint pain.  Skin: Negative.   Neurological: Positive for dizziness and weakness.  Endo/Heme/Allergies: Negative.   Psychiatric/Behavioral: Positive for depression. The patient is nervous/anxious and has insomnia.     Objective:  Physical Exam  Constitutional: She is oriented to person, place, and time. She appears well-developed and well-nourished.  HENT:  Head: Normocephalic and atraumatic.  Eyes: Pupils are equal, round, and reactive to light.  Neck: Normal range of motion. Neck supple.  Cardiovascular: Intact distal pulses.  Respiratory: Effort normal.  Musculoskeletal:        General: Tenderness present.     Comments: Patient is examined seated in a wheelchair any attempts at internal or extra rotation of the right hip cause significant discomfort and pain she is to have a ambulates slowly using a cane in her left hand and has a marked Trendelenburg gait.  She is neurovascular intact distally  the skin is intact no cuts scrapes or abrasions.  Full range of motion of the knee and ankle.    Neurological: She is alert and oriented to person, place, and time.  Skin: Skin is warm and dry.  Psychiatric: She has a normal mood and affect. Her behavior is normal. Judgment and thought content normal.    Vital signs in last 24 hours: Temp:  [98.2 F (36.8 C)] 98.2 F (36.8 C) (06/23 1130) Pulse Rate:  [73] 73 (06/23 1130) Resp:  [16] 16 (06/23 1130) BP: (123)/(76)  123/76 (06/23 1130) SpO2:  [99 %] 99 % (06/23 1130) Weight:  [80 kg] 80 kg (06/23 1130)  Labs:   Estimated body mass index is 28.46 kg/m as calculated from the following:   Height as of 03/30/19: 5\' 6"  (1.676 m).   Weight as of 03/30/19: 80 kg.   Imaging Review Plain radiographs demonstrate  lateral subluxation of the femoral head with collapse of the superior third of the femoral head      Assessment/Plan:  End stage arthritis, right hip(s)  The patient history, physical examination, clinical judgement of the provider and imaging studies are consistent with end stage degenerative joint disease of the right hip(s) and total hip arthroplasty is deemed medically necessary. The treatment options including medical management, injection therapy, arthroscopy and arthroplasty were discussed at length. The risks and benefits of total hip arthroplasty were presented and reviewed. The risks due to aseptic loosening, infection, stiffness, dislocation/subluxation,  thromboembolic complications and other imponderables were discussed.  The patient acknowledged the explanation, agreed to proceed with the plan and consent was signed. Patient is being admitted for inpatient treatment for surgery, pain control, PT, OT, prophylactic antibiotics, VTE prophylaxis, progressive ambulation and ADL's and discharge planning.The patient is planning to be discharged home with home health services   Patient's anticipated LOS is less than 2  midnights, meeting these requirements: - Younger than 60 - Lives within 1 hour of care - Has a competent adult at home to recover with post-op recover - NO history of  - Chronic pain requiring opiods  - Diabetes  - Coronary Artery Disease  - Heart failure  - Heart attack  - Stroke  - DVT/VTE  - Cardiac arrhythmia  - Respiratory Failure/COPD  - Renal failure  - Anemia  - Advanced Liver disease

## 2019-03-31 ENCOUNTER — Ambulatory Visit (HOSPITAL_COMMUNITY): Payer: BC Managed Care – PPO | Admitting: Certified Registered"

## 2019-03-31 ENCOUNTER — Ambulatory Visit (HOSPITAL_COMMUNITY): Payer: BC Managed Care – PPO | Admitting: Physician Assistant

## 2019-03-31 ENCOUNTER — Ambulatory Visit (HOSPITAL_COMMUNITY)
Admission: RE | Admit: 2019-03-31 | Discharge: 2019-04-01 | Disposition: A | Discharge status: Home / Self Care | Payer: BC Managed Care – PPO | Attending: Orthopedic Surgery | Admitting: Orthopedic Surgery

## 2019-03-31 ENCOUNTER — Ambulatory Visit (HOSPITAL_COMMUNITY): Payer: BC Managed Care – PPO

## 2019-03-31 ENCOUNTER — Encounter (HOSPITAL_COMMUNITY)
Admission: RE | Disposition: A | Discharge status: Home / Self Care | Payer: Self-pay | Source: Home / Self Care | Attending: Orthopedic Surgery

## 2019-03-31 ENCOUNTER — Other Ambulatory Visit: Payer: Self-pay

## 2019-03-31 ENCOUNTER — Encounter (HOSPITAL_COMMUNITY): Payer: Self-pay | Admitting: Emergency Medicine

## 2019-03-31 DIAGNOSIS — Z419 Encounter for procedure for purposes other than remedying health state, unspecified: Secondary | ICD-10-CM

## 2019-03-31 DIAGNOSIS — F418 Other specified anxiety disorders: Secondary | ICD-10-CM | POA: Diagnosis not present

## 2019-03-31 DIAGNOSIS — F419 Anxiety disorder, unspecified: Secondary | ICD-10-CM | POA: Diagnosis not present

## 2019-03-31 DIAGNOSIS — Z471 Aftercare following joint replacement surgery: Secondary | ICD-10-CM | POA: Diagnosis not present

## 2019-03-31 DIAGNOSIS — M1611 Unilateral primary osteoarthritis, right hip: Secondary | ICD-10-CM | POA: Insufficient documentation

## 2019-03-31 DIAGNOSIS — D62 Acute posthemorrhagic anemia: Secondary | ICD-10-CM | POA: Diagnosis not present

## 2019-03-31 DIAGNOSIS — Z791 Long term (current) use of non-steroidal anti-inflammatories (NSAID): Secondary | ICD-10-CM | POA: Insufficient documentation

## 2019-03-31 DIAGNOSIS — F17210 Nicotine dependence, cigarettes, uncomplicated: Secondary | ICD-10-CM | POA: Insufficient documentation

## 2019-03-31 DIAGNOSIS — E785 Hyperlipidemia, unspecified: Secondary | ICD-10-CM | POA: Diagnosis not present

## 2019-03-31 DIAGNOSIS — Z853 Personal history of malignant neoplasm of breast: Secondary | ICD-10-CM | POA: Insufficient documentation

## 2019-03-31 DIAGNOSIS — I2510 Atherosclerotic heart disease of native coronary artery without angina pectoris: Secondary | ICD-10-CM | POA: Insufficient documentation

## 2019-03-31 DIAGNOSIS — Z881 Allergy status to other antibiotic agents status: Secondary | ICD-10-CM | POA: Diagnosis not present

## 2019-03-31 DIAGNOSIS — M879 Osteonecrosis, unspecified: Secondary | ICD-10-CM | POA: Insufficient documentation

## 2019-03-31 DIAGNOSIS — Z79899 Other long term (current) drug therapy: Secondary | ICD-10-CM | POA: Diagnosis not present

## 2019-03-31 DIAGNOSIS — Z96641 Presence of right artificial hip joint: Secondary | ICD-10-CM | POA: Diagnosis not present

## 2019-03-31 DIAGNOSIS — M80051A Age-related osteoporosis with current pathological fracture, right femur, initial encounter for fracture: Secondary | ICD-10-CM | POA: Diagnosis not present

## 2019-03-31 DIAGNOSIS — S72001A Fracture of unspecified part of neck of right femur, initial encounter for closed fracture: Secondary | ICD-10-CM | POA: Diagnosis not present

## 2019-03-31 DIAGNOSIS — M87059 Idiopathic aseptic necrosis of unspecified femur: Secondary | ICD-10-CM

## 2019-03-31 DIAGNOSIS — M84351A Stress fracture, right femur, initial encounter for fracture: Secondary | ICD-10-CM | POA: Diagnosis not present

## 2019-03-31 HISTORY — PX: TOTAL HIP ARTHROPLASTY: SHX124

## 2019-03-31 LAB — TYPE AND SCREEN
ABO/RH(D): B POS
Antibody Screen: NEGATIVE

## 2019-03-31 SURGERY — ARTHROPLASTY, HIP, TOTAL, ANTERIOR APPROACH
Anesthesia: Spinal | Site: Hip | Laterality: Right

## 2019-03-31 MED ORDER — SCOPOLAMINE 1 MG/3DAYS TD PT72
MEDICATED_PATCH | TRANSDERMAL | Status: AC
  Administered 2019-03-31: 10:00:00 1.5 mg via TRANSDERMAL
  Filled 2019-03-31: qty 1

## 2019-03-31 MED ORDER — ATORVASTATIN CALCIUM 20 MG PO TABS
20.0000 mg | ORAL_TABLET | Freq: Every day | ORAL | Status: DC
  Administered 2019-03-31 – 2019-04-01 (×2): 20 mg via ORAL
  Filled 2019-03-31 (×2): qty 1

## 2019-03-31 MED ORDER — METHOCARBAMOL 500 MG IVPB - SIMPLE MED
INTRAVENOUS | Status: AC
  Administered 2019-03-31: 500 mg via INTRAVENOUS
  Filled 2019-03-31: qty 50

## 2019-03-31 MED ORDER — MIDAZOLAM HCL 5 MG/5ML IJ SOLN
INTRAMUSCULAR | Status: DC | PRN
  Administered 2019-03-31: 2 mg via INTRAVENOUS

## 2019-03-31 MED ORDER — SODIUM CHLORIDE (PF) 0.9 % IJ SOLN
INTRAMUSCULAR | Status: AC
  Filled 2019-03-31: qty 50

## 2019-03-31 MED ORDER — TRANEXAMIC ACID 1000 MG/10ML IV SOLN
INTRAVENOUS | Status: DC | PRN
  Administered 2019-03-31: 13:00:00 2000 mg via TOPICAL

## 2019-03-31 MED ORDER — ONDANSETRON HCL 4 MG PO TABS: 4.0000 mg | ORAL_TABLET | Freq: Four times a day (QID) | ORAL | Status: DC | PRN

## 2019-03-31 MED ORDER — TIZANIDINE HCL 2 MG PO TABS: 2.0000 mg | ORAL_TABLET | Freq: Four times a day (QID) | ORAL | 0 refills | Status: DC | PRN

## 2019-03-31 MED ORDER — FENTANYL CITRATE (PF) 100 MCG/2ML IJ SOLN
25.0000 ug | INTRAMUSCULAR | Status: AC | PRN
  Administered 2019-03-31 (×2): 25 ug via INTRAVENOUS

## 2019-03-31 MED ORDER — LAMOTRIGINE 25 MG PO TABS
150.0000 mg | ORAL_TABLET | Freq: Every day | ORAL | Status: DC
  Administered 2019-03-31 – 2019-04-01 (×2): 150 mg via ORAL
  Filled 2019-03-31 (×2): qty 1

## 2019-03-31 MED ORDER — OXYCODONE-ACETAMINOPHEN 5-325 MG PO TABS: 1.0000 | ORAL_TABLET | ORAL | 0 refills | Status: DC | PRN

## 2019-03-31 MED ORDER — METHOCARBAMOL 500 MG PO TABS
500.0000 mg | ORAL_TABLET | Freq: Four times a day (QID) | ORAL | Status: DC | PRN
  Administered 2019-03-31 – 2019-04-01 (×2): 500 mg via ORAL
  Filled 2019-03-31 (×2): qty 1

## 2019-03-31 MED ORDER — TRANEXAMIC ACID-NACL 1000-0.7 MG/100ML-% IV SOLN
1000.0000 mg | Freq: Once | INTRAVENOUS | Status: AC
  Administered 2019-03-31: 1000 mg via INTRAVENOUS
  Filled 2019-03-31: qty 100

## 2019-03-31 MED ORDER — DOCUSATE SODIUM 100 MG PO CAPS
100.0000 mg | ORAL_CAPSULE | Freq: Two times a day (BID) | ORAL | Status: DC
  Administered 2019-03-31 – 2019-04-01 (×2): 100 mg via ORAL
  Filled 2019-03-31 (×2): qty 1

## 2019-03-31 MED ORDER — HYDROMORPHONE HCL 1 MG/ML IJ SOLN: 0.5000 mg | INTRAMUSCULAR | Status: DC | PRN

## 2019-03-31 MED ORDER — POVIDONE-IODINE 10 % EX SWAB
2.0000 "application " | Freq: Once | CUTANEOUS | Status: AC
  Administered 2019-03-31: 2 via TOPICAL

## 2019-03-31 MED ORDER — HYDROMORPHONE HCL 1 MG/ML IJ SOLN
0.2500 mg | INTRAMUSCULAR | Status: DC | PRN
  Administered 2019-03-31 (×3): 0.5 mg via INTRAVENOUS

## 2019-03-31 MED ORDER — SUCCINYLCHOLINE CHLORIDE 200 MG/10ML IV SOSY
PREFILLED_SYRINGE | INTRAVENOUS | Status: AC
  Filled 2019-03-31: qty 20

## 2019-03-31 MED ORDER — ACETAMINOPHEN 500 MG PO TABS
1000.0000 mg | ORAL_TABLET | Freq: Once | ORAL | Status: AC
  Administered 2019-03-31: 1000 mg via ORAL
  Filled 2019-03-31: qty 2

## 2019-03-31 MED ORDER — OXYCODONE HCL 5 MG PO TABS
5.0000 mg | ORAL_TABLET | ORAL | Status: DC | PRN
  Administered 2019-03-31 (×2): 5 mg via ORAL
  Administered 2019-04-01 (×3): 10 mg via ORAL
  Filled 2019-03-31: qty 1
  Filled 2019-03-31: qty 2
  Filled 2019-03-31: qty 1
  Filled 2019-03-31 (×2): qty 2

## 2019-03-31 MED ORDER — POLYETHYLENE GLYCOL 3350 17 G PO PACK: 17.0000 g | PACK | Freq: Every day | ORAL | Status: DC | PRN

## 2019-03-31 MED ORDER — FENTANYL CITRATE (PF) 100 MCG/2ML IJ SOLN
INTRAMUSCULAR | Status: AC
  Filled 2019-03-31: qty 2

## 2019-03-31 MED ORDER — DEXAMETHASONE SODIUM PHOSPHATE 10 MG/ML IJ SOLN
10.0000 mg | Freq: Once | INTRAMUSCULAR | Status: AC
  Administered 2019-04-01: 10:00:00 10 mg via INTRAVENOUS
  Filled 2019-03-31: qty 1

## 2019-03-31 MED ORDER — PANTOPRAZOLE SODIUM 40 MG PO TBEC
40.0000 mg | DELAYED_RELEASE_TABLET | Freq: Every day | ORAL | Status: DC
  Administered 2019-03-31 – 2019-04-01 (×2): 40 mg via ORAL
  Filled 2019-03-31 (×2): qty 1

## 2019-03-31 MED ORDER — PHENYLEPHRINE 40 MCG/ML (10ML) SYRINGE FOR IV PUSH (FOR BLOOD PRESSURE SUPPORT)
PREFILLED_SYRINGE | INTRAVENOUS | Status: DC | PRN
  Administered 2019-03-31: 60 ug via INTRAVENOUS

## 2019-03-31 MED ORDER — SCOPOLAMINE 1 MG/3DAYS TD PT72
1.0000 | MEDICATED_PATCH | TRANSDERMAL | Status: DC
  Administered 2019-03-31: 1 via TRANSDERMAL
  Administered 2019-03-31: 10:00:00 1.5 mg via TRANSDERMAL

## 2019-03-31 MED ORDER — CHLORHEXIDINE GLUCONATE 4 % EX LIQD: 60.0000 mL | Freq: Once | CUTANEOUS | Status: DC

## 2019-03-31 MED ORDER — PHENOL 1.4 % MT LIQD
1.0000 | OROMUCOSAL | Status: DC | PRN
  Filled 2019-03-31: qty 177

## 2019-03-31 MED ORDER — PROMETHAZINE HCL 25 MG/ML IJ SOLN: 6.2500 mg | INTRAMUSCULAR | Status: DC | PRN

## 2019-03-31 MED ORDER — FENTANYL CITRATE (PF) 100 MCG/2ML IJ SOLN
INTRAMUSCULAR | Status: DC | PRN
  Administered 2019-03-31: 100 ug via INTRAVENOUS

## 2019-03-31 MED ORDER — HYDROMORPHONE HCL 1 MG/ML IJ SOLN
INTRAMUSCULAR | Status: AC
  Administered 2019-03-31: 15:00:00 0.5 mg via INTRAVENOUS
  Filled 2019-03-31: qty 1

## 2019-03-31 MED ORDER — ACETAMINOPHEN 500 MG PO TABS
1000.0000 mg | ORAL_TABLET | Freq: Four times a day (QID) | ORAL | Status: DC
  Administered 2019-03-31 – 2019-04-01 (×3): 1000 mg via ORAL
  Filled 2019-03-31 (×3): qty 2

## 2019-03-31 MED ORDER — MEPERIDINE HCL 50 MG/ML IJ SOLN: 6.2500 mg | INTRAMUSCULAR | Status: DC | PRN

## 2019-03-31 MED ORDER — 0.9 % SODIUM CHLORIDE (POUR BTL) OPTIME
TOPICAL | Status: DC | PRN
  Administered 2019-03-31: 13:00:00 1000 mL

## 2019-03-31 MED ORDER — KCL IN DEXTROSE-NACL 20-5-0.45 MEQ/L-%-% IV SOLN
INTRAVENOUS | Status: DC
  Administered 2019-03-31 – 2019-04-01 (×2): via INTRAVENOUS
  Filled 2019-03-31 (×3): qty 1000

## 2019-03-31 MED ORDER — ALUM & MAG HYDROXIDE-SIMETH 200-200-20 MG/5ML PO SUSP: 30.0000 mL | ORAL | Status: DC | PRN

## 2019-03-31 MED ORDER — PHENYLEPHRINE HCL (PRESSORS) 10 MG/ML IV SOLN
INTRAVENOUS | Status: AC
  Filled 2019-03-31: qty 1

## 2019-03-31 MED ORDER — EPHEDRINE SULFATE-NACL 50-0.9 MG/10ML-% IV SOSY
PREFILLED_SYRINGE | INTRAVENOUS | Status: DC | PRN
  Administered 2019-03-31: 5 mg via INTRAVENOUS

## 2019-03-31 MED ORDER — CLONAZEPAM 1 MG PO TABS
1.0000 mg | ORAL_TABLET | Freq: Two times a day (BID) | ORAL | Status: DC | PRN
  Administered 2019-03-31: 22:00:00 1 mg via ORAL
  Filled 2019-03-31: qty 1

## 2019-03-31 MED ORDER — BUPIVACAINE-EPINEPHRINE (PF) 0.25% -1:200000 IJ SOLN
INTRAMUSCULAR | Status: AC
  Filled 2019-03-31: qty 30

## 2019-03-31 MED ORDER — ACETAMINOPHEN 325 MG PO TABS: 325.0000 mg | ORAL_TABLET | Freq: Four times a day (QID) | ORAL | Status: DC | PRN

## 2019-03-31 MED ORDER — MIDAZOLAM HCL 2 MG/2ML IJ SOLN
INTRAMUSCULAR | Status: AC
  Filled 2019-03-31: qty 2

## 2019-03-31 MED ORDER — ASPIRIN EC 81 MG PO TBEC: 81.0000 mg | DELAYED_RELEASE_TABLET | Freq: Two times a day (BID) | ORAL | 0 refills | Status: DC

## 2019-03-31 MED ORDER — METOCLOPRAMIDE HCL 5 MG PO TABS: 5.0000 mg | ORAL_TABLET | Freq: Three times a day (TID) | ORAL | Status: DC | PRN

## 2019-03-31 MED ORDER — BISACODYL 5 MG PO TBEC: 5.0000 mg | DELAYED_RELEASE_TABLET | Freq: Every day | ORAL | Status: DC | PRN

## 2019-03-31 MED ORDER — SODIUM CHLORIDE 0.9% FLUSH
INTRAVENOUS | Status: DC | PRN
  Administered 2019-03-31: 50 mL

## 2019-03-31 MED ORDER — METHOCARBAMOL 500 MG IVPB - SIMPLE MED
500.0000 mg | Freq: Four times a day (QID) | INTRAVENOUS | Status: DC | PRN
  Administered 2019-03-31: 500 mg via INTRAVENOUS
  Filled 2019-03-31: qty 50

## 2019-03-31 MED ORDER — DIPHENHYDRAMINE HCL 12.5 MG/5ML PO ELIX: 12.5000 mg | ORAL_SOLUTION | ORAL | Status: DC | PRN

## 2019-03-31 MED ORDER — CELECOXIB 200 MG PO CAPS
200.0000 mg | ORAL_CAPSULE | Freq: Two times a day (BID) | ORAL | Status: DC
  Administered 2019-03-31 – 2019-04-01 (×2): 200 mg via ORAL
  Filled 2019-03-31 (×2): qty 1

## 2019-03-31 MED ORDER — LACTATED RINGERS IV SOLN
INTRAVENOUS | Status: DC
  Administered 2019-03-31 (×2): via INTRAVENOUS

## 2019-03-31 MED ORDER — TRANEXAMIC ACID-NACL 1000-0.7 MG/100ML-% IV SOLN
1000.0000 mg | INTRAVENOUS | Status: AC
  Administered 2019-03-31: 13:00:00 1000 mg via INTRAVENOUS
  Filled 2019-03-31: qty 100

## 2019-03-31 MED ORDER — MENTHOL 3 MG MT LOZG: 1.0000 | LOZENGE | OROMUCOSAL | Status: DC | PRN

## 2019-03-31 MED ORDER — ONDANSETRON HCL 4 MG/2ML IJ SOLN
INTRAMUSCULAR | Status: DC | PRN
  Administered 2019-03-31: 4 mg via INTRAVENOUS

## 2019-03-31 MED ORDER — KETOROLAC TROMETHAMINE 30 MG/ML IJ SOLN: 30.0000 mg | Freq: Once | INTRAMUSCULAR | Status: DC | PRN

## 2019-03-31 MED ORDER — FLEET ENEMA 7-19 GM/118ML RE ENEM: 1.0000 | ENEMA | Freq: Once | RECTAL | Status: DC | PRN

## 2019-03-31 MED ORDER — GABAPENTIN 300 MG PO CAPS
300.0000 mg | ORAL_CAPSULE | Freq: Three times a day (TID) | ORAL | Status: DC
  Administered 2019-03-31 – 2019-04-01 (×3): 300 mg via ORAL
  Filled 2019-03-31 (×3): qty 1

## 2019-03-31 MED ORDER — METOCLOPRAMIDE HCL 5 MG/ML IJ SOLN: 5.0000 mg | Freq: Three times a day (TID) | INTRAMUSCULAR | Status: DC | PRN

## 2019-03-31 MED ORDER — PROPOFOL 10 MG/ML IV BOLUS
INTRAVENOUS | Status: AC
  Filled 2019-03-31: qty 20

## 2019-03-31 MED ORDER — VANCOMYCIN HCL IN DEXTROSE 1-5 GM/200ML-% IV SOLN
1000.0000 mg | INTRAVENOUS | Status: AC
  Administered 2019-03-31: 12:00:00 1000 mg via INTRAVENOUS
  Filled 2019-03-31: qty 200

## 2019-03-31 MED ORDER — ONDANSETRON HCL 4 MG/2ML IJ SOLN: 4.0000 mg | Freq: Four times a day (QID) | INTRAMUSCULAR | Status: DC | PRN

## 2019-03-31 MED ORDER — SODIUM CHLORIDE 0.9 % IV SOLN
INTRAVENOUS | Status: DC | PRN
  Administered 2019-03-31: 15 ug/min via INTRAVENOUS
  Administered 2019-03-31: 10 ug/min via INTRAVENOUS

## 2019-03-31 MED ORDER — FENTANYL CITRATE (PF) 100 MCG/2ML IJ SOLN
25.0000 ug | INTRAMUSCULAR | Status: AC | PRN
  Administered 2019-03-31 (×2): 25 ug via INTRAVENOUS
  Filled 2019-03-31: qty 2

## 2019-03-31 MED ORDER — DEXAMETHASONE SODIUM PHOSPHATE 10 MG/ML IJ SOLN
INTRAMUSCULAR | Status: DC | PRN
  Administered 2019-03-31: 10 mg via INTRAVENOUS

## 2019-03-31 MED ORDER — PROPOFOL 500 MG/50ML IV EMUL
INTRAVENOUS | Status: DC | PRN
  Administered 2019-03-31: 150 ug/kg/min via INTRAVENOUS

## 2019-03-31 MED ORDER — BUPIVACAINE HCL (PF) 0.75 % IJ SOLN
INTRAMUSCULAR | Status: DC | PRN
  Administered 2019-03-31: 1.6 mL

## 2019-03-31 MED ORDER — HYDROMORPHONE HCL 1 MG/ML IJ SOLN
INTRAMUSCULAR | Status: AC
  Administered 2019-03-31: 0.5 mg via INTRAVENOUS
  Filled 2019-03-31: qty 1

## 2019-03-31 MED ORDER — BUPIVACAINE LIPOSOME 1.3 % IJ SUSP
INTRAMUSCULAR | Status: DC | PRN
  Administered 2019-03-31: 20 mL

## 2019-03-31 MED ORDER — BUPIVACAINE-EPINEPHRINE 0.25% -1:200000 IJ SOLN
INTRAMUSCULAR | Status: DC | PRN
  Administered 2019-03-31: 30 mL

## 2019-03-31 MED ORDER — ASPIRIN 81 MG PO CHEW
81.0000 mg | CHEWABLE_TABLET | Freq: Two times a day (BID) | ORAL | Status: DC
  Administered 2019-03-31 – 2019-04-01 (×2): 81 mg via ORAL
  Filled 2019-03-31 (×2): qty 1

## 2019-03-31 SURGICAL SUPPLY — 43 items
BAG DECANTER FOR FLEXI CONT (MISCELLANEOUS) ×2 IMPLANT
BLADE SAW SGTL 18X1.27X75 (BLADE) ×2 IMPLANT
BLADE SURG SZ10 CARB STEEL (BLADE) ×2 IMPLANT
CHLORAPREP W/TINT 26 (MISCELLANEOUS) ×2 IMPLANT
CONT SPEC 4OZ CLIKSEAL STRL BL (MISCELLANEOUS) ×2 IMPLANT
COVER PERINEAL POST (MISCELLANEOUS) ×2 IMPLANT
COVER SURGICAL LIGHT HANDLE (MISCELLANEOUS) ×2 IMPLANT
COVER WAND RF STERILE (DRAPES) ×2 IMPLANT
CUP ACETBLR 48 OD SECTOR II (Hips) ×2 IMPLANT
DECANTER SPIKE VIAL GLASS SM (MISCELLANEOUS) ×4 IMPLANT
DRAPE STERI IOBAN 125X83 (DRAPES) ×2 IMPLANT
DRAPE U-SHAPE 47X51 STRL (DRAPES) ×4 IMPLANT
DRSG AQUACEL AG ADV 3.5X10 (GAUZE/BANDAGES/DRESSINGS) ×2 IMPLANT
ELECT REM PT RETURN 15FT ADLT (MISCELLANEOUS) ×2 IMPLANT
ELIMINATOR HOLE APEX DEPUY (Hips) ×2 IMPLANT
GLOVE BIO SURGEON STRL SZ7.5 (GLOVE) ×2 IMPLANT
GLOVE BIO SURGEON STRL SZ8.5 (GLOVE) ×2 IMPLANT
GLOVE BIOGEL PI IND STRL 8 (GLOVE) ×1 IMPLANT
GLOVE BIOGEL PI IND STRL 9 (GLOVE) ×1 IMPLANT
GLOVE BIOGEL PI INDICATOR 8 (GLOVE) ×1
GLOVE BIOGEL PI INDICATOR 9 (GLOVE) ×1
GOWN STRL REUS W/TWL XL LVL3 (GOWN DISPOSABLE) ×4 IMPLANT
HEAD FEMORAL 32 CERAMIC (Hips) ×2 IMPLANT
HOLDER FOLEY CATH W/STRAP (MISCELLANEOUS) ×2 IMPLANT
KIT TURNOVER KIT A (KITS) IMPLANT
MANIFOLD NEPTUNE II (INSTRUMENTS) ×2 IMPLANT
NEEDLE HYPO 21X1.5 SAFETY (NEEDLE) ×4 IMPLANT
NS IRRIG 1000ML POUR BTL (IV SOLUTION) ×2 IMPLANT
PACK ANTERIOR HIP CUSTOM (KITS) ×2 IMPLANT
PINN ALTRX NEUT ID X OD 32X48 ×2 IMPLANT
STEM FEMORAL SZ 6MM STD ACTIS (Stem) ×2 IMPLANT
SUT ETHIBOND NAB CT1 #1 30IN (SUTURE) ×2 IMPLANT
SUT VIC AB 0 CT1 27 (SUTURE) ×1
SUT VIC AB 0 CT1 27XBRD ANBCTR (SUTURE) ×1 IMPLANT
SUT VIC AB 1 CTX 36 (SUTURE) ×1
SUT VIC AB 1 CTX36XBRD ANBCTR (SUTURE) ×1 IMPLANT
SUT VIC AB 2-0 CT1 27 (SUTURE) ×1
SUT VIC AB 2-0 CT1 TAPERPNT 27 (SUTURE) ×1 IMPLANT
SUT VIC AB 3-0 CT1 27 (SUTURE) ×1
SUT VIC AB 3-0 CT1 TAPERPNT 27 (SUTURE) ×1 IMPLANT
SYR CONTROL 10ML LL (SYRINGE) ×6 IMPLANT
TRAY FOLEY MTR SLVR 14FR STAT (SET/KITS/TRAYS/PACK) ×2 IMPLANT
YANKAUER SUCT BULB TIP 10FT TU (MISCELLANEOUS) ×2 IMPLANT

## 2019-03-31 NOTE — Discharge Instructions (Signed)

## 2019-03-31 NOTE — Anesthesia Preprocedure Evaluation (Addendum)
Anesthesia Evaluation  Patient identified by MRN, date of birth, ID band Patient awake    Reviewed: Allergy & Precautions, NPO status , Patient's Chart, lab work & pertinent test results  History of Anesthesia Complications (+) PONV and history of anesthetic complications  Airway Mallampati: II  TM Distance: >3 FB Neck ROM: Full    Dental no notable dental hx. (+) Dental Advisory Given   Pulmonary Current Smoker,    Pulmonary exam normal breath sounds clear to auscultation       Cardiovascular + CAD (non-obstructive by cath)  Normal cardiovascular exam Rhythm:Regular Rate:Normal  ECG: NSR, rate 79   Neuro/Psych PSYCHIATRIC DISORDERS Anxiety Depression  Neuromuscular disease    GI/Hepatic negative GI ROS, Neg liver ROS,   Endo/Other  negative endocrine ROS  Renal/GU negative Renal ROS     Musculoskeletal Lumbar nerve root impingement   Abdominal   Peds  Hematology negative hematology ROS (+) HLD   Anesthesia Other Findings  Right Femoral Head Fracture  Reproductive/Obstetrics                           Anesthesia Physical Anesthesia Plan  ASA: II  Anesthesia Plan: Spinal   Post-op Pain Management:    Induction: Intravenous  PONV Risk Score and Plan: 2 and Ondansetron, Dexamethasone, Midazolam, Treatment may vary due to age or medical condition and Scopolamine patch - Pre-op  Airway Management Planned: Natural Airway and Simple Face Mask  Additional Equipment:   Intra-op Plan:   Post-operative Plan:   Informed Consent: I have reviewed the patients History and Physical, chart, labs and discussed the procedure including the risks, benefits and alternatives for the proposed anesthesia with the patient or authorized representative who has indicated his/her understanding and acceptance.     Dental advisory given  Plan Discussed with: CRNA and Surgeon  Anesthesia Plan Comments:         Anesthesia Quick Evaluation

## 2019-03-31 NOTE — Transfer of Care (Signed)
Immediate Anesthesia Transfer of Care Note  Patient: Sandra Conrad  Procedure(s) Performed: Right Anterior Total Hip Arthroplasty (Right Hip)  Patient Location: PACU  Anesthesia Type:MAC and Spinal  Level of Consciousness: awake and patient cooperative  Airway & Oxygen Therapy: Patient Spontanous Breathing and Patient connected to face mask oxygen  Post-op Assessment: Report given to RN and Post -op Vital signs reviewed and stable  Post vital signs: Reviewed and stable  Last Vitals:  Vitals Value Taken Time  BP 108/55 03/31/19 1435  Temp    Pulse 82 03/31/19 1437  Resp 15 03/31/19 1437  SpO2 97 % 03/31/19 1437  Vitals shown include unvalidated device data.  Last Pain:  Vitals:   03/31/19 1151  TempSrc:   PainSc: 3       Patients Stated Pain Goal: 4 (42/35/36 1443)  Complications: No apparent anesthesia complications

## 2019-03-31 NOTE — Evaluation (Signed)
Physical Therapy Evaluation Patient Details Name: Sandra Conrad MRN: [id number] DOB: 1956/05/14 Today's Date: 03/31/2019   History of Present Illness  63 yo female s/p R DA-THA on 03/31/19. PMH includes MDD, anxiety, breast cancer , diverticulosis, OP, L4-L5 laminectomy 2019.  Clinical Impression  Pt presents with R hip pain, difficulty performing mobility tasks, and decreased activity tolerance due to pain. Pt to benefit from acute PT to address deficits. Pt ambulated hallway distance with RW with min guard assist, verbal cuing for form and safety provided. Pt educated on ankle pumps (20/hour) to perform this afternoon/evening to increase circulation, to pt's tolerance and limited by pain. PT to progress mobility as tolerated, and will continue to follow acutely.        Follow Up Recommendations Follow surgeon's recommendation for DC plan and follow-up therapies;Supervision for mobility/OOB(HHPT)    Equipment Recommendations  Rolling walker with 5" wheels    Recommendations for Other Services       Precautions / Restrictions Precautions Precautions: Fall Restrictions Weight Bearing Restrictions: No Other Position/Activity Restrictions: WBAT      Mobility  Bed Mobility Overal bed mobility: Needs Assistance Bed Mobility: Supine to Sit     Supine to sit: HOB elevated;Min assist     General bed mobility comments: Min assist for RLE management, verbal cuing for sequencing. Increased time.  Transfers Overall transfer level: Needs assistance Equipment used: Rolling walker (2 wheeled) Transfers: Sit to/from Stand Sit to Stand: Min guard;From elevated surface         General transfer comment: min guard for safety, verbal cuing for hand placement when rising.  Ambulation/Gait Ambulation/Gait assistance: Min guard Gait Distance (Feet): 50 Feet Assistive device: Rolling walker (2 wheeled) Gait Pattern/deviations: Step-to pattern;Decreased step length - left;Decreased step  length - right;Antalgic Gait velocity: decr   General Gait Details: Min guard for safety, verbal cuing for placement in RW, sequencing. Pt with increasing antalgic gait with further ambulation distance.  Stairs            Wheelchair Mobility    Modified Rankin (Stroke Patients Only)       Balance Overall balance assessment: Mild deficits observed, not formally tested                                           Pertinent Vitals/Pain Pain Assessment: 0-10 Pain Score: 5  Pain Location: R hip Pain Descriptors / Indicators: Sore Pain Intervention(s): Limited activity within patient's tolerance;Monitored during session;Premedicated before session;Repositioned    Home Living Family/patient expects to be discharged to:: Private residence Living Arrangements: Alone Available Help at Discharge: Family;Available 24 hours/day(daughter to stay with her in apt) Type of Home: Apartment Home Access: Stairs to enter Entrance Stairs-Rails: Left Entrance Stairs-Number of Steps: 7 Home Layout: One level Home Equipment: Wheelchair - manual;Cane - single point      Prior Function Level of Independence: Independent with assistive device(s)         Comments: Pt reports rapid deterioration of R hip due to fracture last week, and has been using cane since March. 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     Hand Dominance   Dominant Hand: Right    Extremity/Trunk Assessment   Upper Extremity Assessment Upper Extremity Assessment: Overall WFL for tasks assessed    Lower Extremity Assessment Lower Extremity Assessment: Overall WFL for tasks assessed    Cervical /  Trunk Assessment Cervical / Trunk Assessment: Normal  Communication   Communication: No difficulties  Cognition Arousal/Alertness: Awake/alert Behavior During Therapy: WFL for tasks assessed/performed Overall Cognitive Status: Within Functional Limits for tasks assessed                                  General Comments: pt pleasant and talkative      General Comments      Exercises     Assessment/Plan    PT Assessment Patient needs continued PT services  PT Problem List Decreased strength;Decreased mobility;Decreased range of motion;Decreased coordination;Decreased activity tolerance;Decreased balance;Decreased knowledge of use of DME;Pain       PT Treatment Interventions DME instruction;Therapeutic activities;Gait training;Therapeutic exercise;Patient/family education;Balance training;Stair training;Functional mobility training    PT Goals (Current goals can be found in the Care Plan section)  Acute Rehab PT Goals PT Goal Formulation: With patient Time For Goal Achievement: 04/07/19 Potential to Achieve Goals: Good    Frequency 7X/week   Barriers to discharge        Co-evaluation               AM-PAC PT "6 Clicks" Mobility  Outcome Measure Help needed turning from your back to your side while in a flat bed without using bedrails?: A Little Help needed moving from lying on your back to sitting on the side of a flat bed without using bedrails?: A Little Help needed moving to and from a bed to a chair (including a wheelchair)?: A Little Help needed standing up from a chair using your arms (e.g., wheelchair or bedside chair)?: A Little Help needed to walk in hospital room?: A Little Help needed climbing 3-5 steps with a railing? : A Lot 6 Click Score: 17    End of Session Equipment Utilized During Treatment: Gait belt Activity Tolerance: Patient tolerated treatment well;Patient limited by pain Patient left: in chair;with chair alarm set;with call bell/phone within reach;with SCD's reapplied Nurse Communication: Mobility status PT Visit Diagnosis: Other abnormalities of gait and mobility (R26.89);Difficulty in walking, not elsewhere classified (R26.2)    Time: 4163-8453 PT Time Calculation (min) (ACUTE ONLY): 28 min   Charges:   PT Evaluation $PT  Eval Low Complexity: 1 Low PT Treatments $Gait Training: 8-22 mins       Julien Girt, PT Acute Rehabilitation Services Pager [phone]  Office [phone]  Roxine Caddy D Elonda Husky 03/31/2019, 7:38 PM

## 2019-03-31 NOTE — Anesthesia Procedure Notes (Signed)
Spinal  Patient location during procedure: OR Start time: 03/31/2019 12:40 PM End time: 03/31/2019 12:42 PM Staffing Anesthesiologist: Annye Asa, MD Resident/CRNA: Lind Covert, CRNA Performed: resident/CRNA  Preanesthetic Checklist Completed: patient identified, site marked, surgical consent, pre-op evaluation, timeout performed, IV checked, risks and benefits discussed and monitors and equipment checked Spinal Block Patient position: sitting Prep: DuraPrep Patient monitoring: heart rate, cardiac monitor, continuous pulse ox and blood pressure Approach: midline Location: L3-4 Injection technique: single-shot Needle Needle type: Pencan  Needle gauge: 24 G Needle length: 10 cm Needle insertion depth: 7 cm Assessment Sensory level: T6 Additional Notes Timeout performed. SAB kit date checked. SAB without difficulty.

## 2019-03-31 NOTE — Progress Notes (Signed)
Pt transported to room with 2 bags, phone and eyeglasses

## 2019-03-31 NOTE — Plan of Care (Signed)

## 2019-03-31 NOTE — Interval H&P Note (Signed)
History and Physical Interval Note:  03/31/2019 12:14 PM  Sandra Conrad  has presented today for surgery, with the diagnosis of Right Femoral Head Fracture.  The various methods of treatment have been discussed with the patient and family. After consideration of risks, benefits and other options for treatment, the patient has consented to  Procedure(s): Right Anterior Hip Arthroplasty (Right) as a surgical intervention.  The patient's history has been reviewed, patient examined, no change in status, stable for surgery.  I have reviewed the patient's chart and labs.  Questions were answered to the patient's satisfaction.     Kerin Salen

## 2019-03-31 NOTE — Op Note (Signed)
OPERATIVE REPORT    DATE OF PROCEDURE:  03/31/2019       PREOPERATIVE DIAGNOSIS:  Right Femoral Head Fracture                                                          POSTOPERATIVE DIAGNOSIS:  Right Femoral Head Fracture                                                           PROCEDURE: Anterior R total hip arthroplasty using a 48 mm DePuy Gryption sector Cup, [employer], 0-degree polyethylene liner, a +1 mm x 15mm ceramic head, a 6std Depuy Actis stem   SURGEON: Kerin Salen    ASSISTANT:   Kerry Hough. [employer]  (present throughout entire procedure and necessary for timely completion of the procedure)   ANESTHESIA: Spinal BLOOD LOSS: 300 cc FLUID REPLACEMENT: 1600 cc crystalloid Antibiotic: 1gm vancomycin Tranexamic Acid: 1gm IV, 2gm Topical Exparel: 266mg  COMPLICATIONS: none    INDICATIONS FOR PROCEDURE: A 63 y.o. year-old With  Right Femoral Head Fracture   for 1 years, x-rays show bone-on-bone arthritic changes, and osteophytes. Despite conservative measures with observation, anti-inflammatory medicine, narcotics, use of a cane, has severe unremitting pain and can ambulate only a few blocks before resting. Patient desires elective R total hip arthroplasty to decrease pain and increase function. The risks, benefits, and alternatives were discussed at length including but not limited to the risks of infection, bleeding, nerve injury, stiffness, blood clots, the need for revision surgery, cardiopulmonary complications, among others, and they were willing to proceed. Questions answered      PROCEDURE IN DETAIL: The patient was identified by armband,   received preoperative IV antibiotics in the holding area at [facility], taken to the operating room , appropriate anesthetic monitors   were attached and  anesthesia was induced with the patient on the gurney. The HANA boots were applied to the feet and the patient  was transferred to the HANA table with a peroneal  post and support underneath the non-operative leg, which was locked in 2 lb traction. Theoperative lower extremity was then prepped and draped in the usual sterile fashion from just above the iliac crest to the knee. And a timeout procedure was performed. We then made a 13 cm incision along the interval at the leading edge of the tensor fascia lata of starting at 2 cm lateral to the ASIS. Small bleeders in the skin and subcutaneous tissue identified and cauterized we dissected down to the fascia and made an incision in the fascia allowing [place] to elevate the fascia of the tensor muscle and exploited the interval between the rectus and the tensor fascia lata. A Cobra retractor was then placed along the superior neck of the femur. A cerebellar retractor was used to expose the interval between the tensor fascia lata and the rectus femoris. .  We identified and cauterized the ascending branch of the anterior circumflex artery. A second Cobra retractor along the inferior neck of the femur. A small Hohmann retractor was placed underneath the origin of the rectus femoris,  giving [place] good medial exposure. Using Ronguers fatty tissue was removed from in front of the anterior capsule. The capsule was then incised, starting out at the superior anterior aspect of the acetabulum going laterally along the anterior neck. The capsule was then teed along the neck superiorly and inferiorly. Electrocautery was used to release capsule from the anterior and medial neck of the femur to allow external rotation. Cobra retractors were then placed along the inferior and superior neck allowing [place] to perform a standard neck cut and removed the femoral head with a power corkscrew. We then placed a medium bent homan retractor in the cotyloid notch and posteriorly along the acetabular rim a narrow Cobra retractor. Exposed labral tissue was then removed with the electrocautery. We then sequentially reamed up to a 47 mm basket reamer obtaining good  coverage in all quadrants, verified by C-arm imaging. Under C-arm control we then hammered into place a 48 mm Pinnacle cup in 45 of abduction and 15 of anteversion. The cup seated nicely and required no supplemental screws. We then placed a central hole Eliminator and a 0 polyethylene liner. The foot was then externally rotated to 130-140. The limb was extended and adducted delivering the proximal femur up into the wound. A medium curved Hohmann retractor was placed over the greater trochanter and a long Homan retractor along the posterior femoral neck completing the exposure. We then performed releases superiorly and and inferiorly of the capsule going back to the pirformis fossa superiorly and to the lesser trochanter inferiorly. We then entered the proximal femur with the box cutting offset chisel followed by, a canal sounder, the chili pepper and broaching up to a 6 broach. This seated nicely and we reamed the calcar. A trial reduction was performed with a 1 mm 32 mm head.The limb lengths were excellent the hip was stable in 90 of external rotation. At this point the trial components removed and we hammered into place a # 6std  Offset Actis stem with Gryption coating. A +1 mm x 32 ceramic head was then hammered into place. The hip was reduced and final C-arm images obtained. The wound was thoroughly irrigated with normal saline solution. We repaired the ant capsule and the tensor fascia lot a with running 0 vicryl suture. the subcutaneous tissue was closed with 2-0 and 3-0 Vicryl suture followed by an Aquacil dressing. At this point the patient was awaken and transferred to hospital gurney without difficulty.   Kerin Salen 03/31/2019, 2:03 PM

## 2019-04-01 ENCOUNTER — Encounter (HOSPITAL_COMMUNITY): Payer: Self-pay | Admitting: Orthopedic Surgery

## 2019-04-01 DIAGNOSIS — M879 Osteonecrosis, unspecified: Secondary | ICD-10-CM | POA: Diagnosis not present

## 2019-04-01 DIAGNOSIS — Z881 Allergy status to other antibiotic agents status: Secondary | ICD-10-CM | POA: Diagnosis not present

## 2019-04-01 DIAGNOSIS — F419 Anxiety disorder, unspecified: Secondary | ICD-10-CM | POA: Diagnosis not present

## 2019-04-01 DIAGNOSIS — Z853 Personal history of malignant neoplasm of breast: Secondary | ICD-10-CM | POA: Diagnosis not present

## 2019-04-01 DIAGNOSIS — Z791 Long term (current) use of non-steroidal anti-inflammatories (NSAID): Secondary | ICD-10-CM | POA: Diagnosis not present

## 2019-04-01 DIAGNOSIS — D62 Acute posthemorrhagic anemia: Secondary | ICD-10-CM | POA: Diagnosis not present

## 2019-04-01 DIAGNOSIS — I2510 Atherosclerotic heart disease of native coronary artery without angina pectoris: Secondary | ICD-10-CM | POA: Diagnosis not present

## 2019-04-01 DIAGNOSIS — Z79899 Other long term (current) drug therapy: Secondary | ICD-10-CM | POA: Diagnosis not present

## 2019-04-01 DIAGNOSIS — M1611 Unilateral primary osteoarthritis, right hip: Secondary | ICD-10-CM | POA: Diagnosis not present

## 2019-04-01 DIAGNOSIS — M80051A Age-related osteoporosis with current pathological fracture, right femur, initial encounter for fracture: Secondary | ICD-10-CM | POA: Diagnosis not present

## 2019-04-01 DIAGNOSIS — F17210 Nicotine dependence, cigarettes, uncomplicated: Secondary | ICD-10-CM | POA: Diagnosis not present

## 2019-04-01 LAB — CBC
HCT: 35.2 % — ABNORMAL LOW (ref 36.0–46.0)
Hemoglobin: 11.1 g/dL — ABNORMAL LOW (ref 12.0–15.0)
MCH: 32.8 pg (ref 26.0–34.0)
MCHC: 31.5 g/dL (ref 30.0–36.0)
MCV: 104.1 fL — ABNORMAL HIGH (ref 80.0–100.0)
Platelets: 297 10*3/uL (ref 150–400)
RBC: 3.38 MIL/uL — ABNORMAL LOW (ref 3.87–5.11)
RDW: 13.2 % (ref 11.5–15.5)
WBC: 11.7 10*3/uL — ABNORMAL HIGH (ref 4.0–10.5)
nRBC: 0 % (ref 0.0–0.2)

## 2019-04-01 LAB — BASIC METABOLIC PANEL
Anion gap: 7 (ref 5–15)
BUN: 13 mg/dL (ref 8–23)
CO2: 24 mmol/L (ref 22–32)
Calcium: 8.8 mg/dL — ABNORMAL LOW (ref 8.9–10.3)
Chloride: 107 mmol/L (ref 98–111)
Creatinine, Ser: 0.61 mg/dL (ref 0.44–1.00)
GFR calc Af Amer: >60 mL/min (ref >60)
GFR calc non Af Amer: >60 mL/min (ref >60)
Glucose, Bld: 175 mg/dL — ABNORMAL HIGH (ref 70–99)
Potassium: 4.4 mmol/L (ref 3.5–5.1)
Sodium: 138 mmol/L (ref 135–145)

## 2019-04-01 NOTE — Anesthesia Postprocedure Evaluation (Signed)
Anesthesia Post Note  Patient: Sandra Conrad  Procedure(s) Performed: Right Anterior Total Hip Arthroplasty (Right Hip)     Patient location during evaluation: PACU Anesthesia Type: Spinal Level of consciousness: awake and alert Pain management: pain level controlled Vital Signs Assessment: post-procedure vital signs reviewed and stable Respiratory status: spontaneous breathing and respiratory function stable Cardiovascular status: blood pressure returned to baseline and stable Postop Assessment: spinal receding Anesthetic complications: no    Last Vitals:  Vitals:   04/01/19 0522 04/01/19 1036  BP: (!) 103/59 124/69  Pulse: 72 81  Resp: 18 16  Temp: 36.5 C 36.5 C  SpO2: 100% 97%    Last Pain:  Vitals:   04/01/19 0930  TempSrc:   PainSc: 6                  Tiajuana Amass

## 2019-04-01 NOTE — TOC Transition Note (Signed)
Transition of Care [facility]) - CM/SW Discharge Note   Patient Details  Name: Sandra Conrad MRN: [id number] Date of Birth: 05-Jul-1956  Transition [facility] CM/SW Contact:  Leeroy Cha, RN Phone Number: 04/01/2019, 8:26 AM   Clinical Narrative:    hhc through [facility]   Final next level of care: [place] Barriers to Discharge: No Barriers Identified   Patient Goals and CMS Choice Patient states their goals for this hospitalization and ongoing recovery are:: to go home and get better CMS Medicare.gov Compare Post Acute Care list provided to:: Patient Choice offered to / list presented to : Patient  Discharge Placement                       Discharge Plan and Services   Discharge Planning Services: CM Consult Post Acute Care Choice: Durable Medical Equipment, Home Health          DME Arranged: Walker rolling, 3-N-1 DME Agency: [employer] Date DME Agency Contacted: 04/01/19 Time DME Agency Contacted: 0900   HH Arranged: PT HH Agency: [facility] [employer] Date [place]: 04/01/19 Time [place]: 0825 Representative spoke with at [place]: [place] (Anton Ruiz) Interventions     Readmission Risk Interventions No flowsheet data found.

## 2019-04-01 NOTE — Progress Notes (Signed)
PATIENT ID: Sandra Conrad  MRN: [id number]  DOB/AGE:  [birth year] / 63 y.o.  1 Day Post-Op Procedure(s) (LRB): Right Anterior Total Hip Arthroplasty (Right)    PROGRESS NOTE Subjective: Patient is alert, oriented, no Nausea, no Vomiting, yes passing gas, . Taking PO well. Denies SOB, Chest or Calf Pain. Using Incentive Spirometer, PAS in place. Ambulate in hallway Patient reports pain as  1/10  .    Objective: Vital signs in last 24 hours: Vitals:   03/31/19 1920 03/31/19 2105 04/01/19 0116 04/01/19 0522  BP:  (Abnormal) 106/52 (Abnormal) 97/57 (Abnormal) 103/59  Pulse:  75 63 72  Resp: 18 18 16 18   Temp: 99.1 F (37.3 C) 98.1 F (36.7 C) 97.7 F (36.5 C) 97.7 F (36.5 C)  TempSrc: Oral  Oral Oral  SpO2:  97% 95% 100%  Weight:      Height:          Intake/Output from previous day: I/O last 3 completed shifts: In: 4428.7 [P.O.:1080; I.V.:3248.7; IV Piggyback:100] Out: 9518 [Urine:3335; Blood:300]   Intake/Output this shift: Total I/O In: -  Out: 300 [Urine:300]   LABORATORY DATA: Recent Labs    03/30/19 1220 04/01/19 0248  WBC 7.0 11.7*  HGB 13.7 11.1*  HCT 43.0 35.2*  PLT 355 297  NA 140 138  K 4.0 4.4  CL 103 107  CO2 27 24  BUN 11 13  CREATININE 0.57 0.61  GLUCOSE 92 175*  INR 0.9  --   CALCIUM 9.4 8.8*    Examination: Neurologically intact ABD soft Neurovascular intact Sensation intact distally Intact pulses distally Dorsiflexion/Plantar flexion intact Incision: dressing C/D/I No cellulitis present Compartment soft} XR AP&Lat of hip shows well placed\fixed THA  Assessment:   1 Day Post-Op Procedure(s) (LRB): Right Anterior Total Hip Arthroplasty (Right) ADDITIONAL DIAGNOSIS:  Expected Acute Blood Loss Anemia, Depression  Patient's anticipated LOS is less than 2 midnights, meeting these requirements: - Younger than 71 - Lives within 1 hour of care - Has a competent adult at home to recover with post-op recover - NO history of  - Chronic  pain requiring opiods  - Diabetes  - Coronary Artery Disease  - Heart failure  - Heart attack  - Stroke  - DVT/VTE  - Cardiac arrhythmia  - Respiratory Failure/COPD  - Renal failure  - Anemia  - Advanced Liver disease       Plan: PT/OT WBAT, THA  DVT Prophylaxis: SCDx72 hrs, ASA 81 mg BID x 2 weeks  DISCHARGE PLAN: Home,Today after physical therapy  DISCHARGE NEEDS: HHPT, Walker and 3-in-1 comode seatPatient ID: Sandra Conrad, female   DOB: Feb 23, 1956, 63 y.o.   MRN: [id number]

## 2019-04-01 NOTE — Discharge Summary (Signed)
Patient ID: Sandra Conrad MRN: [id number] DOB/AGE: 1955-11-04 63 y.o.  Admit date: 03/31/2019 Discharge date: 04/01/2019  Admission Diagnoses:  Principal Problem:   AVN of femur [facility])   Discharge Diagnoses:  Same  Past Medical History:  Diagnosis Date  . Abdominal pain    1 time in last 2 years ago/  . Anxiety   . Arthritis   . Blood transfusion without reported diagnosis 2005   1 time  . Cancer [facility]) 2011   Right breast IDC  . Complication of anesthesia    Pt request scopolamine patch  . Coronary artery disease    cardiac cath -15 % blockage  . Depression   . Diarrhea    last time in 2013  . Diverticulosis   . GERD (gastroesophageal reflux disease)   . Insomnia   . Lumbar herniated disc    L3-4 and L5  . Lumbar nerve root impingement    right leg  . OA (osteoarthritis)    right hip and spine  . Osteoporosis   . Personal history of chemotherapy    oral/on meds for 4 years  . Personal history of radiation therapy 2008   had 35 treatments/ last on 2008  . PONV (postoperative nausea and vomiting)     Surgeries: Procedure(s): Right Anterior Total Hip Arthroplasty on 03/31/2019   Consultants:   Discharged Condition: Improved  Hospital Course: STEVEY STAPLETON is an 63 y.o. female who was admitted 03/31/2019 for operative treatment ofAVN of femur ([place]). Patient has severe unremitting pain that affects sleep, daily activities, and work/hobbies. After pre-op clearance the patient was taken to the operating room on 03/31/2019 and underwent  Procedure(s): Right Anterior Total Hip Arthroplasty.    Patient was given perioperative antibiotics:  Anti-infectives (From admission, onward)   Start     Dose/Rate Route Frequency Ordered Stop   03/31/19 0945  vancomycin (VANCOCIN) IVPB 1000 mg/200 mL premix     1,000 mg 200 mL/hr over 60 Minutes Intravenous On call to O.R. 03/31/19 1937 03/31/19 1253       Patient was given sequential compression devices, early ambulation,  and chemoprophylaxis to prevent DVT.  Patient benefited maximally from hospital stay and there were no complications.    Recent vital signs:  Patient Vitals for the past 24 hrs:  BP Temp Temp src Pulse Resp SpO2  04/01/19 1036 124/69 97.7 F (36.5 C) - 81 16 97 %  04/01/19 0522 (!) 103/59 97.7 F (36.5 C) Oral 72 18 100 %  04/01/19 0116 (!) 97/57 97.7 F (36.5 C) Oral 63 16 95 %  03/31/19 2105 (!) 106/52 98.1 F (36.7 C) - 75 18 97 %  03/31/19 1920 - 99.1 F (37.3 C) Oral - 18 -  03/31/19 1849 (!) 122/55 - - 82 - 97 %  03/31/19 1807 112/62 - - 76 16 99 %  03/31/19 1651 118/65 97.6 F (36.4 C) - 65 16 99 %  03/31/19 1558 (!) 114/55 97.7 F (36.5 C) Oral 65 14 100 %  03/31/19 1530 134/69 - - 73 15 100 %  03/31/19 1515 102/71 (!) 97.4 F (36.3 C) - 66 18 100 %  03/31/19 1500 (!) 118/59 - - 77 (!) 22 99 %  03/31/19 1445 (!) 102/59 - - 77 (!) 22 97 %  03/31/19 1436 (!) 108/55 (!) 97.4 F (36.3 C) - 87 16 96 %  03/31/19 1151 (!) 96/55 - - 66 18 100 %     Recent laboratory studies:  Recent Labs    03/30/19 1220 04/01/19 0248  WBC 7.0 11.7*  HGB 13.7 11.1*  HCT 43.0 35.2*  PLT 355 297  NA 140 138  K 4.0 4.4  CL 103 107  CO2 27 24  BUN 11 13  CREATININE 0.57 0.61  GLUCOSE 92 175*  INR 0.9  --   CALCIUM 9.4 8.8*     Discharge Medications:   Allergies as of 04/01/2019      Reactions   Cefdinir Nausea Only, Other (See Comments)   Abdominal pain      Medication List    STOP taking these medications   acetaminophen 500 MG tablet Commonly known as: TYLENOL   HYDROcodone-acetaminophen 5-325 MG tablet Commonly known as: NORCO/VICODIN   meloxicam 15 MG tablet Commonly known as: MOBIC   metoCLOPramide 10 MG tablet Commonly known as: Reglan     TAKE these medications   aspirin EC 81 MG tablet Take 1 tablet (81 mg total) by mouth 2 (two) times daily.   atorvastatin 20 MG tablet Commonly known as: LIPITOR Take 20 mg by mouth daily.   clonazePAM 1 MG  tablet Commonly known as: KLONOPIN Take 1 tablet (1 mg total) by mouth 3 (three) times daily as needed for anxiety. What changed: when to take this   gabapentin 300 MG capsule Commonly known as: NEURONTIN Take 300 mg by mouth 3 (three) times daily.   lamoTRIgine 150 MG tablet Commonly known as: LAMICTAL Take 150 mg by mouth daily.   oxyCODONE-acetaminophen 5-325 MG tablet Commonly known as: PERCOCET/ROXICET Take 1 tablet by mouth every 4 (four) hours as needed for severe pain.   tiZANidine 2 MG tablet Commonly known as: ZANAFLEX Take 1 tablet (2 mg total) by mouth every 6 (six) hours as needed.            Durable Medical Equipment  (From admission, onward)         Start     Ordered   03/31/19 1558  DME Walker rolling  Once    Question:  Patient needs a walker to treat with the following condition  Answer:  Status post right hip replacement   03/31/19 1558   03/31/19 1558  DME 3 n 1  Once     03/31/19 1558           Discharge Care Instructions  (From admission, onward)         Start     Ordered   04/01/19 0000  Weight bearing as tolerated     04/01/19 1143          Diagnostic Studies: Dg Chest 2 View  Result Date: 03/30/2019 CLINICAL DATA:  63 year old female undergoing preoperative evaluation prior to right hip arthroplasty EXAM: CHEST - 2 VIEW COMPARISON:  Prior chest x-ray 10/22/2014 FINDINGS: The lungs are clear and negative for focal airspace consolidation, pulmonary edema or suspicious pulmonary nodule. No pleural effusion or pneumothorax. Cardiac and mediastinal contours are within normal limits. No acute fracture or lytic or blastic osseous lesions. The visualized upper abdominal bowel gas pattern is unremarkable. IMPRESSION: Normal chest x-ray. Electronically Signed   By: Jacqulynn Cadet M.D.   On: 03/30/2019 17:28   Dg C-arm 1-60 Min-no Report  Result Date: 03/31/2019 Fluoroscopy was utilized by the requesting physician.  No radiographic  interpretation.   Dg Hip Operative Unilat With Pelvis Right  Result Date: 03/31/2019 CLINICAL DATA:  Hip replacement EXAM: OPERATIVE right HIP (WITH PELVIS IF PERFORMED) 4 VIEWS TECHNIQUE: Fluoroscopic spot  image(s) were submitted for interpretation post-operatively. COMPARISON:  None. FINDINGS: Four low resolution intraoperative spot views of the right hip. Total fluoroscopy time was 20 seconds. There are bilateral hip replacements. Right hip replacement demonstrates normal alignment. Gas in the soft tissues consistent with recent surgery IMPRESSION: Intraoperative fluoroscopic assistance provided during right hip replacement surgery Electronically Signed   By: Donavan Foil M.D.   On: 03/31/2019 16:24    Disposition: Discharge disposition: 01-Home or Self Care       Discharge Instructions    Call MD / Call 911   Complete by: As directed    If you experience chest pain or shortness of breath, CALL 911 and be transported to the hospital emergency room.  If you develope a fever above 101 F, pus (white drainage) or increased drainage or redness at the wound, or calf pain, call your surgeon's office.   Constipation Prevention   Complete by: As directed    Drink plenty of fluids.  Prune juice may be helpful.  You may use a stool softener, such as Colace (over the counter) 100 mg twice a day.  Use MiraLax (over the counter) for constipation as needed.   Diet - low sodium heart healthy   Complete by: As directed    Driving restrictions   Complete by: As directed    No driving for 2 weeks   Increase activity slowly as tolerated   Complete by: As directed    Patient may shower   Complete by: As directed    You may shower without a dressing once there is no drainage.  Do not wash over the wound.  If drainage remains, cover wound with plastic wrap and then shower.   Weight bearing as tolerated   Complete by: As directed       Follow-up Information    Frederik Pear, MD In 2 weeks.    Specialty: Orthopedic Surgery Contact information: [place] [address] [phone]        [facility], [facility] Follow up.   Specialty: [facility] Contact information: [address] [place] [address] [phone]            Signed: Joanell Rising 04/01/2019, 11:45 AM

## 2019-04-01 NOTE — Progress Notes (Signed)
Physical Therapy Treatment Patient Details Name: Sandra Conrad MRN: [id number] DOB: 06-20-1956 Today's Date: 04/01/2019    History of Present Illness 63 yo female s/p R DA-THA on 03/31/19. PMH includes MDD, anxiety, breast cancer , diverticulosis, OP, L4-L5 laminectomy 2019.    PT Comments    Pt progressing with PT. Ready for d/c from PT standpoint  Follow Up Recommendations  Follow surgeon's recommendation for DC plan and follow-up therapies;Supervision for mobility/OOB     Equipment Recommendations  Rolling walker with 5" wheels    Recommendations for Other Services       Precautions / Restrictions Precautions Precautions: Fall Restrictions Weight Bearing Restrictions: No Other Position/Activity Restrictions: WBAT    Mobility  Bed Mobility               General bed mobility comments: pt in recliner  Transfers Overall transfer level: Needs assistance Equipment used: Rolling walker (2 wheeled) Transfers: Sit to/from Stand Sit to Stand: Supervision;Modified independent (Device/Increase time)            Ambulation/Gait Ambulation/Gait assistance: Supervision;Modified independent (Device/Increase time) Gait Distance (Feet): 200 Feet Assistive device: Rolling walker (2 wheeled) Gait Pattern/deviations: Step-to pattern;Decreased weight shift to right Gait velocity: decr   General Gait Details: cues for sequence   Stairs Stairs: Yes Stairs assistance: Min guard Stair Management: One rail Right;Step to pattern;Forwards;With cane Number of Stairs: 4 General stair comments: cues for sequence and overal safety   Wheelchair Mobility    Modified Rankin (Stroke Patients Only)       Balance                                            Cognition Arousal/Alertness: Awake/alert Behavior During Therapy: WFL for tasks assessed/performed Overall Cognitive Status: Within Functional Limits for tasks assessed                                  General Comments: pt pleasant and talkative      Exercises      General Comments        Pertinent Vitals/Pain Pain Assessment: 0-10 Pain Score: 4  Pain Location: R hip Pain Descriptors / Indicators: Sore Pain Intervention(s): Limited activity within patient's tolerance;Monitored during session;Premedicated before session;Repositioned    Home Living                      Prior Function            PT Goals (current goals can now be found in the care plan section) Acute Rehab PT Goals PT Goal Formulation: With patient Time For Goal Achievement: 04/07/19 Potential to Achieve Goals: Good Progress towards PT goals: Progressing toward goals    Frequency    7X/week      PT Plan Current plan remains appropriate    Co-evaluation              AM-PAC PT "6 Clicks" Mobility   Outcome Measure  Help needed turning from your back to your side while in a flat bed without using bedrails?: A Little Help needed moving from lying on your back to sitting on the side of a flat bed without using bedrails?: A Little Help needed moving to and from a bed to a chair (including a wheelchair)?: A Little Help needed standing  up from a chair using your arms (e.g., wheelchair or bedside chair)?: A Little Help needed to walk in hospital room?: A Little Help needed climbing 3-5 steps with a railing? : A Little 6 Click Score: 18    End of Session Equipment Utilized During Treatment: Gait belt Activity Tolerance: Patient tolerated treatment well Patient left: in chair;with call bell/phone within reach Nurse Communication: Mobility status(ready fo rd/c) PT Visit Diagnosis: Other abnormalities of gait and mobility (R26.89);Difficulty in walking, not elsewhere classified (R26.2)     Time: 0712-1975 PT Time Calculation (min) (ACUTE ONLY): 23 min  Charges:  $Gait Training: 23-37 mins                     Kenyon Ana, PT  Pager: [phone] Acute Rehab  Dept [facility]): [phone]   04/01/2019    [facility] 04/01/2019, 11:20 AM

## 2019-04-03 DIAGNOSIS — F17210 Nicotine dependence, cigarettes, uncomplicated: Secondary | ICD-10-CM | POA: Diagnosis not present

## 2019-04-03 DIAGNOSIS — M47819 Spondylosis without myelopathy or radiculopathy, site unspecified: Secondary | ICD-10-CM | POA: Diagnosis not present

## 2019-04-03 DIAGNOSIS — S72051D Unspecified fracture of head of right femur, subsequent encounter for closed fracture with routine healing: Secondary | ICD-10-CM | POA: Diagnosis not present

## 2019-04-03 DIAGNOSIS — F419 Anxiety disorder, unspecified: Secondary | ICD-10-CM | POA: Diagnosis not present

## 2019-04-03 DIAGNOSIS — Z471 Aftercare following joint replacement surgery: Secondary | ICD-10-CM | POA: Diagnosis not present

## 2019-04-03 DIAGNOSIS — I2510 Atherosclerotic heart disease of native coronary artery without angina pectoris: Secondary | ICD-10-CM | POA: Diagnosis not present

## 2019-04-03 DIAGNOSIS — Z96641 Presence of right artificial hip joint: Secondary | ICD-10-CM | POA: Diagnosis not present

## 2019-04-03 DIAGNOSIS — K219 Gastro-esophageal reflux disease without esophagitis: Secondary | ICD-10-CM | POA: Diagnosis not present

## 2019-04-03 DIAGNOSIS — Z9181 History of falling: Secondary | ICD-10-CM | POA: Diagnosis not present

## 2019-04-03 DIAGNOSIS — Z853 Personal history of malignant neoplasm of breast: Secondary | ICD-10-CM | POA: Diagnosis not present

## 2019-04-03 DIAGNOSIS — F332 Major depressive disorder, recurrent severe without psychotic features: Secondary | ICD-10-CM | POA: Diagnosis not present

## 2019-04-05 DIAGNOSIS — M1611 Unilateral primary osteoarthritis, right hip: Secondary | ICD-10-CM | POA: Diagnosis not present

## 2019-04-05 DIAGNOSIS — Z96641 Presence of right artificial hip joint: Secondary | ICD-10-CM | POA: Diagnosis not present

## 2019-04-06 DIAGNOSIS — I2510 Atherosclerotic heart disease of native coronary artery without angina pectoris: Secondary | ICD-10-CM | POA: Diagnosis not present

## 2019-04-06 DIAGNOSIS — Z853 Personal history of malignant neoplasm of breast: Secondary | ICD-10-CM | POA: Diagnosis not present

## 2019-04-06 DIAGNOSIS — Z96641 Presence of right artificial hip joint: Secondary | ICD-10-CM | POA: Diagnosis not present

## 2019-04-06 DIAGNOSIS — F419 Anxiety disorder, unspecified: Secondary | ICD-10-CM | POA: Diagnosis not present

## 2019-04-06 DIAGNOSIS — M47819 Spondylosis without myelopathy or radiculopathy, site unspecified: Secondary | ICD-10-CM | POA: Diagnosis not present

## 2019-04-06 DIAGNOSIS — S72051D Unspecified fracture of head of right femur, subsequent encounter for closed fracture with routine healing: Secondary | ICD-10-CM | POA: Diagnosis not present

## 2019-04-06 DIAGNOSIS — Z9181 History of falling: Secondary | ICD-10-CM | POA: Diagnosis not present

## 2019-04-06 DIAGNOSIS — F332 Major depressive disorder, recurrent severe without psychotic features: Secondary | ICD-10-CM | POA: Diagnosis not present

## 2019-04-06 DIAGNOSIS — K219 Gastro-esophageal reflux disease without esophagitis: Secondary | ICD-10-CM | POA: Diagnosis not present

## 2019-04-06 DIAGNOSIS — F17210 Nicotine dependence, cigarettes, uncomplicated: Secondary | ICD-10-CM | POA: Diagnosis not present

## 2019-04-07 DIAGNOSIS — Z853 Personal history of malignant neoplasm of breast: Secondary | ICD-10-CM | POA: Diagnosis not present

## 2019-04-07 DIAGNOSIS — K219 Gastro-esophageal reflux disease without esophagitis: Secondary | ICD-10-CM | POA: Diagnosis not present

## 2019-04-07 DIAGNOSIS — M47819 Spondylosis without myelopathy or radiculopathy, site unspecified: Secondary | ICD-10-CM | POA: Diagnosis not present

## 2019-04-07 DIAGNOSIS — F332 Major depressive disorder, recurrent severe without psychotic features: Secondary | ICD-10-CM | POA: Diagnosis not present

## 2019-04-07 DIAGNOSIS — I2510 Atherosclerotic heart disease of native coronary artery without angina pectoris: Secondary | ICD-10-CM | POA: Diagnosis not present

## 2019-04-07 DIAGNOSIS — Z96641 Presence of right artificial hip joint: Secondary | ICD-10-CM | POA: Diagnosis not present

## 2019-04-07 DIAGNOSIS — F17210 Nicotine dependence, cigarettes, uncomplicated: Secondary | ICD-10-CM | POA: Diagnosis not present

## 2019-04-07 DIAGNOSIS — Z9181 History of falling: Secondary | ICD-10-CM | POA: Diagnosis not present

## 2019-04-07 DIAGNOSIS — F419 Anxiety disorder, unspecified: Secondary | ICD-10-CM | POA: Diagnosis not present

## 2019-04-07 DIAGNOSIS — S72051D Unspecified fracture of head of right femur, subsequent encounter for closed fracture with routine healing: Secondary | ICD-10-CM | POA: Diagnosis not present

## 2019-04-08 DIAGNOSIS — Z853 Personal history of malignant neoplasm of breast: Secondary | ICD-10-CM | POA: Diagnosis not present

## 2019-04-08 DIAGNOSIS — M47819 Spondylosis without myelopathy or radiculopathy, site unspecified: Secondary | ICD-10-CM | POA: Diagnosis not present

## 2019-04-08 DIAGNOSIS — F419 Anxiety disorder, unspecified: Secondary | ICD-10-CM | POA: Diagnosis not present

## 2019-04-08 DIAGNOSIS — Z96641 Presence of right artificial hip joint: Secondary | ICD-10-CM | POA: Diagnosis not present

## 2019-04-08 DIAGNOSIS — S72051D Unspecified fracture of head of right femur, subsequent encounter for closed fracture with routine healing: Secondary | ICD-10-CM | POA: Diagnosis not present

## 2019-04-08 DIAGNOSIS — F17210 Nicotine dependence, cigarettes, uncomplicated: Secondary | ICD-10-CM | POA: Diagnosis not present

## 2019-04-08 DIAGNOSIS — Z9181 History of falling: Secondary | ICD-10-CM | POA: Diagnosis not present

## 2019-04-08 DIAGNOSIS — F332 Major depressive disorder, recurrent severe without psychotic features: Secondary | ICD-10-CM | POA: Diagnosis not present

## 2019-04-08 DIAGNOSIS — K219 Gastro-esophageal reflux disease without esophagitis: Secondary | ICD-10-CM | POA: Diagnosis not present

## 2019-04-08 DIAGNOSIS — I2510 Atherosclerotic heart disease of native coronary artery without angina pectoris: Secondary | ICD-10-CM | POA: Diagnosis not present

## 2019-04-12 DIAGNOSIS — I2510 Atherosclerotic heart disease of native coronary artery without angina pectoris: Secondary | ICD-10-CM | POA: Diagnosis not present

## 2019-04-12 DIAGNOSIS — F332 Major depressive disorder, recurrent severe without psychotic features: Secondary | ICD-10-CM | POA: Diagnosis not present

## 2019-04-12 DIAGNOSIS — F419 Anxiety disorder, unspecified: Secondary | ICD-10-CM | POA: Diagnosis not present

## 2019-04-12 DIAGNOSIS — F17210 Nicotine dependence, cigarettes, uncomplicated: Secondary | ICD-10-CM | POA: Diagnosis not present

## 2019-04-12 DIAGNOSIS — Z96641 Presence of right artificial hip joint: Secondary | ICD-10-CM | POA: Diagnosis not present

## 2019-04-12 DIAGNOSIS — S72051D Unspecified fracture of head of right femur, subsequent encounter for closed fracture with routine healing: Secondary | ICD-10-CM | POA: Diagnosis not present

## 2019-04-12 DIAGNOSIS — Z9181 History of falling: Secondary | ICD-10-CM | POA: Diagnosis not present

## 2019-04-12 DIAGNOSIS — Z853 Personal history of malignant neoplasm of breast: Secondary | ICD-10-CM | POA: Diagnosis not present

## 2019-04-12 DIAGNOSIS — M47819 Spondylosis without myelopathy or radiculopathy, site unspecified: Secondary | ICD-10-CM | POA: Diagnosis not present

## 2019-04-12 DIAGNOSIS — K219 Gastro-esophageal reflux disease without esophagitis: Secondary | ICD-10-CM | POA: Diagnosis not present

## 2019-04-15 DIAGNOSIS — Z9889 Other specified postprocedural states: Secondary | ICD-10-CM | POA: Diagnosis not present

## 2019-04-15 DIAGNOSIS — M1611 Unilateral primary osteoarthritis, right hip: Secondary | ICD-10-CM | POA: Diagnosis not present

## 2019-04-15 DIAGNOSIS — Z96641 Presence of right artificial hip joint: Secondary | ICD-10-CM | POA: Diagnosis not present

## 2019-04-22 DIAGNOSIS — M1611 Unilateral primary osteoarthritis, right hip: Secondary | ICD-10-CM | POA: Diagnosis not present

## 2019-04-22 DIAGNOSIS — Z96641 Presence of right artificial hip joint: Secondary | ICD-10-CM | POA: Diagnosis not present

## 2019-04-22 DIAGNOSIS — Z9889 Other specified postprocedural states: Secondary | ICD-10-CM | POA: Diagnosis not present

## 2019-04-29 DIAGNOSIS — M1611 Unilateral primary osteoarthritis, right hip: Secondary | ICD-10-CM | POA: Diagnosis not present

## 2019-04-29 DIAGNOSIS — Z96641 Presence of right artificial hip joint: Secondary | ICD-10-CM | POA: Diagnosis not present

## 2019-04-29 DIAGNOSIS — Z471 Aftercare following joint replacement surgery: Secondary | ICD-10-CM | POA: Diagnosis not present

## 2019-05-13 DIAGNOSIS — M1611 Unilateral primary osteoarthritis, right hip: Secondary | ICD-10-CM | POA: Diagnosis not present

## 2019-05-14 DIAGNOSIS — K047 Periapical abscess without sinus: Secondary | ICD-10-CM | POA: Diagnosis not present

## 2019-05-15 ENCOUNTER — Emergency Department (HOSPITAL_COMMUNITY)
Admission: EM | Admit: 2019-05-15 | Discharge: 2019-05-15 | Disposition: A | Discharge status: Home / Self Care | Payer: BC Managed Care – PPO | Attending: Emergency Medicine | Admitting: Emergency Medicine

## 2019-05-15 ENCOUNTER — Emergency Department (HOSPITAL_COMMUNITY): Payer: BC Managed Care – PPO

## 2019-05-15 ENCOUNTER — Encounter (HOSPITAL_COMMUNITY): Payer: Self-pay

## 2019-05-15 ENCOUNTER — Other Ambulatory Visit: Payer: Self-pay

## 2019-05-15 DIAGNOSIS — Y9301 Activity, walking, marching and hiking: Secondary | ICD-10-CM | POA: Diagnosis not present

## 2019-05-15 DIAGNOSIS — Y999 Unspecified external cause status: Secondary | ICD-10-CM | POA: Insufficient documentation

## 2019-05-15 DIAGNOSIS — I951 Orthostatic hypotension: Secondary | ICD-10-CM

## 2019-05-15 DIAGNOSIS — R52 Pain, unspecified: Secondary | ICD-10-CM | POA: Diagnosis not present

## 2019-05-15 DIAGNOSIS — Z9861 Coronary angioplasty status: Secondary | ICD-10-CM | POA: Insufficient documentation

## 2019-05-15 DIAGNOSIS — M25552 Pain in left hip: Secondary | ICD-10-CM | POA: Diagnosis not present

## 2019-05-15 DIAGNOSIS — W109XXA Fall (on) (from) unspecified stairs and steps, initial encounter: Secondary | ICD-10-CM | POA: Diagnosis not present

## 2019-05-15 DIAGNOSIS — Z23 Encounter for immunization: Secondary | ICD-10-CM | POA: Insufficient documentation

## 2019-05-15 DIAGNOSIS — R51 Headache: Secondary | ICD-10-CM | POA: Diagnosis not present

## 2019-05-15 DIAGNOSIS — S0101XA Laceration without foreign body of scalp, initial encounter: Secondary | ICD-10-CM | POA: Diagnosis not present

## 2019-05-15 DIAGNOSIS — R0902 Hypoxemia: Secondary | ICD-10-CM | POA: Diagnosis not present

## 2019-05-15 DIAGNOSIS — Z853 Personal history of malignant neoplasm of breast: Secondary | ICD-10-CM | POA: Insufficient documentation

## 2019-05-15 DIAGNOSIS — S7002XA Contusion of left hip, initial encounter: Secondary | ICD-10-CM

## 2019-05-15 DIAGNOSIS — Y92008 Other place in unspecified non-institutional (private) residence as the place of occurrence of the external cause: Secondary | ICD-10-CM | POA: Diagnosis not present

## 2019-05-15 DIAGNOSIS — F17210 Nicotine dependence, cigarettes, uncomplicated: Secondary | ICD-10-CM | POA: Diagnosis not present

## 2019-05-15 DIAGNOSIS — Z7982 Long term (current) use of aspirin: Secondary | ICD-10-CM | POA: Insufficient documentation

## 2019-05-15 DIAGNOSIS — R55 Syncope and collapse: Secondary | ICD-10-CM | POA: Diagnosis not present

## 2019-05-15 DIAGNOSIS — Z96643 Presence of artificial hip joint, bilateral: Secondary | ICD-10-CM | POA: Insufficient documentation

## 2019-05-15 DIAGNOSIS — I2510 Atherosclerotic heart disease of native coronary artery without angina pectoris: Secondary | ICD-10-CM | POA: Insufficient documentation

## 2019-05-15 DIAGNOSIS — Z79899 Other long term (current) drug therapy: Secondary | ICD-10-CM | POA: Diagnosis not present

## 2019-05-15 DIAGNOSIS — I959 Hypotension, unspecified: Secondary | ICD-10-CM | POA: Diagnosis not present

## 2019-05-15 LAB — CBC
HCT: 42.5 % (ref 36.0–46.0)
Hemoglobin: 13.5 g/dL (ref 12.0–15.0)
MCH: 33.2 pg (ref 26.0–34.0)
MCHC: 31.8 g/dL (ref 30.0–36.0)
MCV: 104.4 fL — ABNORMAL HIGH (ref 80.0–100.0)
Platelets: 335 10*3/uL (ref 150–400)
RBC: 4.07 MIL/uL (ref 3.87–5.11)
RDW: 13.2 % (ref 11.5–15.5)
WBC: 11.3 10*3/uL — ABNORMAL HIGH (ref 4.0–10.5)
nRBC: 0 % (ref 0.0–0.2)

## 2019-05-15 LAB — BASIC METABOLIC PANEL
Anion gap: 11 (ref 5–15)
BUN: 15 mg/dL (ref 8–23)
CO2: 24 mmol/L (ref 22–32)
Calcium: 9.1 mg/dL (ref 8.9–10.3)
Chloride: 105 mmol/L (ref 98–111)
Creatinine, Ser: 0.7 mg/dL (ref 0.44–1.00)
GFR calc Af Amer: >60 mL/min (ref >60)
GFR calc non Af Amer: >60 mL/min (ref >60)
Glucose, Bld: 114 mg/dL — ABNORMAL HIGH (ref 70–99)
Potassium: 3.8 mmol/L (ref 3.5–5.1)
Sodium: 140 mmol/L (ref 135–145)

## 2019-05-15 LAB — CBG MONITORING, ED: Glucose-Capillary: 108 mg/dL — ABNORMAL HIGH (ref 70–99)

## 2019-05-15 MED ORDER — SODIUM CHLORIDE 0.9 % IV BOLUS
1000.0000 mL | Freq: Once | INTRAVENOUS | Status: AC
  Administered 2019-05-15: 1000 mL via INTRAVENOUS

## 2019-05-15 MED ORDER — TETANUS-DIPHTH-ACELL PERTUSSIS 5-2.5-18.5 LF-MCG/0.5 IM SUSP
0.5000 mL | Freq: Once | INTRAMUSCULAR | Status: AC
  Administered 2019-05-15: 0.5 mL via INTRAMUSCULAR
  Filled 2019-05-15: qty 0.5

## 2019-05-15 MED ORDER — SODIUM CHLORIDE 0.9 % IV BOLUS: 500.0000 mL | Freq: Once | INTRAVENOUS | Status: DC

## 2019-05-15 MED ORDER — LORAZEPAM 0.5 MG PO TABS
0.5000 mg | ORAL_TABLET | Freq: Once | ORAL | Status: AC
  Administered 2019-05-15: 0.5 mg via ORAL
  Filled 2019-05-15: qty 1

## 2019-05-15 MED ORDER — IBUPROFEN 200 MG PO TABS
400.0000 mg | ORAL_TABLET | Freq: Once | ORAL | Status: AC
  Administered 2019-05-15: 400 mg via ORAL
  Filled 2019-05-15: qty 2

## 2019-05-15 NOTE — ED Provider Notes (Signed)
[facility] DEPT Provider Note   CSN: [id number] Arrival date & time: 05/15/19  0102    History   Chief Complaint Chief Complaint  Patient presents with  . Fall    laceration to back of head  . Left Hip Pain    HPI Sandra Conrad is a 63 y.o. female.     HPI Patient states she was trying to stand and she thinks she had a syncopal episode where she fell backward and struck the back of her head.  Sustained a laceration.  Unknown last tetanus.  Patient also has had left hip replacement and revision and is now complaining of left hip pain.  No focal weakness or numbness.  Denies neck pain. Past Medical History:  Diagnosis Date  . Abdominal pain    1 time in last 2 years ago/  . Anxiety   . Arthritis   . Blood transfusion without reported diagnosis 2005   1 time  . Cancer [facility]) 2011   Right breast IDC  . Complication of anesthesia    Pt request scopolamine patch  . Coronary artery disease    cardiac cath -15 % blockage  . Depression   . Diarrhea    last time in 2013  . Diverticulosis   . GERD (gastroesophageal reflux disease)   . Insomnia   . Lumbar herniated disc    L3-4 and L5  . Lumbar nerve root impingement    right leg  . OA (osteoarthritis)    right hip and spine  . Osteoporosis   . Personal history of chemotherapy    oral/on meds for 4 years  . Personal history of radiation therapy 2008   had 35 treatments/ last on 2008  . PONV (postoperative nausea and vomiting)     Patient Active Problem List   Diagnosis Date Noted  . AVN of femur ([place]) 03/30/2019  . Major depressive disorder, recurrent severe without psychotic features ([place]) 05/02/2015  . Suicidal ideation   . Abdominal pain   . Diarrhea     Past Surgical History:  Procedure Laterality Date  . BACK SURGERY  08/21/2018   L4-L5 and laminectomy  . BREAST EXCISIONAL BIOPSY Right 2008  . BREAST LUMPECTOMY  2008   right breast  . CARDIAC CATHETERIZATION  2012   no  stents/no blockages  . DILATION AND CURETTAGE OF UTERUS  1982  . HIP SURGERY  2005   left hip x 2 ( pt report that first hip failed, revision was needed)  . TOTAL HIP ARTHROPLASTY Right 03/31/2019   Procedure: Right Anterior Total Hip Arthroplasty;  Surgeon: Frederik Pear, MD;  Location: [facility] ORS;  Service: Orthopedics;  Laterality: Right;  . WRIST SURGERY  2007   right wrist     OB History   No obstetric history on file.      Home Medications    Prior to Admission medications   Medication Sig Start Date End Date Taking? Authorizing Provider  atorvastatin (LIPITOR) 20 MG tablet Take 20 mg by mouth every evening.    Yes [provider]  celecoxib (CELEBREX) 200 MG capsule Take 200 mg by mouth 2 (two) times daily.  05/03/19  Yes [provider]  clindamycin (CLEOCIN) 300 MG capsule Take 300 mg by mouth 4 (four) times daily.  05/14/19  Yes [provider]  clonazePAM (KLONOPIN) 1 MG tablet Take 1 tablet (1 mg total) by mouth 3 (three) times daily as needed for anxiety. Patient taking differently: Take  0.5-1 mg by mouth 2 (two) times daily as needed for anxiety.  05/03/15  Yes Pucilowska, Jolanta B, MD  gabapentin (NEURONTIN) 300 MG capsule Take 300 mg by mouth 3 (three) times daily.   Yes [provider]  lamoTRIgine (LAMICTAL) 150 MG tablet Take 150 mg by mouth daily.   Yes [provider]  levofloxacin (LEVAQUIN) 750 MG tablet Take 750 mg by mouth daily.  05/14/19  Yes [provider]  oxyCODONE-acetaminophen (PERCOCET/ROXICET) 5-325 MG tablet Take 1 tablet by mouth every 4 (four) hours as needed for severe pain. Patient taking differently: Take 1 tablet by mouth 2 (two) times daily.  03/31/19  Yes Leighton Parody, PA-C  aspirin EC 81 MG tablet Take 1 tablet (81 mg total) by mouth 2 (two) times daily. Patient not taking: Reported on 05/15/2019 03/31/19   Leighton Parody, PA-C  tiZANidine (ZANAFLEX) 2 MG tablet Take 1 tablet (2 mg total) by  mouth every 6 (six) hours as needed. Patient not taking: Reported on 05/15/2019 03/31/19   Leighton Parody, PA-C    Family History Family History  Problem Relation Age of Onset  . Colon cancer Mother   . Heart attack Father   . Bipolar disorder Sister   . Colon polyps Sister   . Heart disease Brother   . Colon polyps Brother   . Transient ischemic attack Brother   . Esophageal cancer Neg Hx   . Stomach cancer Neg Hx   . Rectal cancer Neg Hx     Social History Social History   Tobacco Use  . Smoking status: Light Tobacco Smoker    Years: 20.00    Types: Cigarettes  . Smokeless tobacco: Never Used  . Tobacco comment: smokes 10 cigarettes a day  Substance Use Topics  . Alcohol use: No  . Drug use: Yes    Types: Benzodiazepines     Allergies   Cefdinir   Review of Systems Review of Systems  Constitutional: Negative for chills and fever.  HENT: Negative for sore throat and trouble swallowing.   Eyes: Negative for visual disturbance.  Respiratory: Negative for cough and shortness of breath.   Gastrointestinal: Negative for abdominal pain, diarrhea and nausea.  Genitourinary: Negative for dysuria, flank pain and frequency.  Musculoskeletal: Positive for arthralgias. Negative for back pain and neck pain.  Skin: Positive for wound. Negative for rash.  Neurological: Positive for syncope, light-headedness and headaches. Negative for weakness and numbness.  Psychiatric/Behavioral: The patient is nervous/anxious.   All other systems reviewed and are negative.    Physical Exam Updated Vital Signs BP 116/61   Pulse 78   Resp 12   Ht (S) 5\' 6"  (1.676 m)   Wt (S) 80 kg   SpO2 99%   BMI 28.47 kg/m   Physical Exam Vitals signs and nursing note reviewed.  Constitutional:      Appearance: Normal appearance. She is well-developed.     Comments: Very anxious appearing  HENT:     Head: Normocephalic.     Comments: Posterior laceration with blood matted hair.  No bony  crepitance.  Patient has 1 cm occipital laceration.  No active bleeding.    Nose: Nose normal.     Mouth/Throat:     Mouth: Mucous membranes are moist.  Eyes:     Extraocular Movements: Extraocular movements intact.     Pupils: Pupils are equal, round, and reactive to light.  Neck:     Musculoskeletal: Normal range of motion and  neck supple.     Comments: No posterior midline cervical tenderness to palpation. Cardiovascular:     Rate and Rhythm: Normal rate and regular rhythm.     Heart sounds: No murmur. No friction rub. No gallop.   Pulmonary:     Effort: Pulmonary effort is normal.     Breath sounds: Normal breath sounds.  Abdominal:     General: Bowel sounds are normal.     Palpations: Abdomen is soft.     Tenderness: There is no abdominal tenderness. There is no guarding or rebound.  Musculoskeletal: Normal range of motion.        General: Tenderness present.     Comments: Mild tenderness to palpation of the left hip.  She is able to range the hip without difficulty.  No shortening or rotation.  Skin:    General: Skin is warm and dry.     Capillary Refill: Capillary refill takes less than 2 seconds.     Findings: No erythema or rash.  Neurological:     General: No focal deficit present.     Mental Status: She is alert and oriented to person, place, and time.     Comments: 5/5 motor in all extremities.  Sensation intact.  Tremor present.  Psychiatric:        Behavior: Behavior normal.     Comments: Very anxious appearing      ED Treatments / Results  Labs (all labs ordered are listed, but only abnormal results are displayed) Labs Reviewed  BASIC METABOLIC PANEL - Abnormal; Notable for the following components:      Result Value   Glucose, Bld 114 (*)    All other components within normal limits  CBC - Abnormal; Notable for the following components:   WBC 11.3 (*)    MCV 104.4 (*)    All other components within normal limits  CBG MONITORING, ED - Abnormal; Notable  for the following components:   Glucose-Capillary 108 (*)    All other components within normal limits    EKG EKG Interpretation  Date/Time:  Saturday May 15 2019 08:59:22 EDT Ventricular Rate:  83 PR Interval:    QRS Duration: 78 QT Interval:  373 QTC Calculation: 439 R Axis:   80 Text Interpretation:  Confirmed by Julianne Rice [phone]) on 05/15/2019 10:57:42 AM   Radiology Ct Head Wo Contrast  Result Date: 05/15/2019 CLINICAL DATA:  63 year old female with history of headache after a fall with injury to the head. Laceration in the posterior aspect of the head. EXAM: CT HEAD WITHOUT CONTRAST TECHNIQUE: Contiguous axial images were obtained from the base of the skull through the vertex without intravenous contrast. COMPARISON:  None. FINDINGS: Brain: No evidence of acute infarction, hemorrhage, hydrocephalus, extra-axial collection or mass lesion/mass effect. Vascular: No hyperdense vessel or unexpected calcification. Skull: Normal. Negative for fracture or focal lesion. Sinuses/Orbits: No acute finding. Other: None. IMPRESSION: 1. No evidence of significant acute traumatic injury to the skull or brain. The appearance of the brain is normal. Electronically Signed   By: Vinnie Langton M.D.   On: 05/15/2019 09:34   Dg Hip Unilat W Or Wo Pelvis 2-3 Views Left  Result Date: 05/15/2019 CLINICAL DATA:  Trauma.  Pain.  Right hip surgery 6 weeks ago. EXAM: DG HIP (WITH OR WITHOUT PELVIS) 2-3V LEFT COMPARISON:  09/26/2017 FINDINGS: Interval right hip arthroplasty. Remote left hip arthroplasty. No acute hardware complication. IMPRESSION: Bilateral hip arthroplasty, without acute superimposed process. Electronically Signed   By: Marylyn Ishihara  Jobe Igo M.D.   On: 05/15/2019 09:48    Procedures .Marland KitchenLaceration Repair  Date/Time: 05/15/2019 10:56 AM Performed by: Julianne Rice, MD Authorized by: Julianne Rice, MD   Consent:    Consent obtained:  Verbal Anesthesia (see MAR for exact dosages):     Anesthesia method:  None Laceration details:    Location:  Scalp   Length (cm):  1 Repair type:    Repair type:  Simple Exploration:    Wound extent: no foreign bodies/material noted     Contaminated: no   Treatment:    Irrigation solution:  Sterile saline   Visualized foreign bodies/material removed: no   Skin repair:    Repair method:  Staples   Number of staples:  1 Approximation:    Approximation:  Close Post-procedure details:    Patient tolerance of procedure:  Tolerated well, no immediate complications   (including critical care time)  Medications Ordered in ED Medications  Tdap (BOOSTRIX) injection 0.5 mL (0.5 mLs Intramuscular Given 05/15/19 0931)  LORazepam (ATIVAN) tablet 0.5 mg (0.5 mg Oral Given 05/15/19 0959)  ibuprofen (ADVIL) tablet 400 mg (400 mg Oral Given 05/15/19 1044)  sodium chloride 0.9 % bolus 1,000 mL (0 mLs Intravenous Stopped 05/15/19 1338)     Initial Impression / Assessment and Plan / ED Course  I have reviewed the triage vital signs and the nursing notes.  Pertinent labs & imaging results that were available during my care of the patient were reviewed by me and considered in my medical decision making (see chart for details).       X-rays without acute findings.  Scalp laceration was repaired.  She will need to have the staple removed in 1 week.  Tetanus was updated.  Patient did have a significant elevation in her heart rate when she stood up.  Given IV fluids for orthostasis which likely contributed to her fall.  Advised to change positions slowly.  Return precautions given.   Final Clinical Impressions(s) / ED Diagnoses   Final diagnoses:  Orthostasis  Laceration of scalp, initial encounter  Contusion of left hip, initial encounter    ED Discharge Orders    None       Julianne Rice, MD 05/15/19 1350

## 2019-05-15 NOTE — ED Triage Notes (Signed)
Patient arrived via [facility] from home. Patient is AOx4 and ambulatory at baseline however not currently. Patient did have surgery on right hip six weeks ago following 2 days later falling and having fracture on right hip. Patient chief complaint is left hip / groin pain, syncope going down steps of porch, fell has laceration on posterior head.   Left hip replacement 16 years ago.   77mcg fentanyl IV 581mL of Normal Saline

## 2019-05-15 NOTE — ED Notes (Signed)
Patient transported to CT 

## 2019-05-20 DIAGNOSIS — Z96641 Presence of right artificial hip joint: Secondary | ICD-10-CM | POA: Diagnosis not present

## 2019-05-20 DIAGNOSIS — Z471 Aftercare following joint replacement surgery: Secondary | ICD-10-CM | POA: Diagnosis not present

## 2019-05-21 DIAGNOSIS — M25552 Pain in left hip: Secondary | ICD-10-CM | POA: Diagnosis not present

## 2019-05-21 DIAGNOSIS — Z96643 Presence of artificial hip joint, bilateral: Secondary | ICD-10-CM | POA: Diagnosis not present

## 2019-05-21 DIAGNOSIS — S0101XA Laceration without foreign body of scalp, initial encounter: Secondary | ICD-10-CM | POA: Diagnosis not present

## 2019-05-21 DIAGNOSIS — R55 Syncope and collapse: Secondary | ICD-10-CM | POA: Diagnosis not present

## 2019-06-15 DIAGNOSIS — E782 Mixed hyperlipidemia: Secondary | ICD-10-CM | POA: Diagnosis not present

## 2019-06-17 DIAGNOSIS — Z471 Aftercare following joint replacement surgery: Secondary | ICD-10-CM | POA: Diagnosis not present

## 2019-06-17 DIAGNOSIS — Z96641 Presence of right artificial hip joint: Secondary | ICD-10-CM | POA: Diagnosis not present

## 2019-06-18 DIAGNOSIS — F3181 Bipolar II disorder: Secondary | ICD-10-CM | POA: Diagnosis not present

## 2019-06-18 DIAGNOSIS — E782 Mixed hyperlipidemia: Secondary | ICD-10-CM | POA: Diagnosis not present

## 2019-06-18 DIAGNOSIS — Z0001 Encounter for general adult medical examination with abnormal findings: Secondary | ICD-10-CM | POA: Diagnosis not present

## 2019-06-18 DIAGNOSIS — F411 Generalized anxiety disorder: Secondary | ICD-10-CM | POA: Diagnosis not present

## 2019-08-11 DIAGNOSIS — Z136 Encounter for screening for cardiovascular disorders: Secondary | ICD-10-CM | POA: Diagnosis not present

## 2019-08-11 DIAGNOSIS — R000 Tachycardia, unspecified: Secondary | ICD-10-CM | POA: Diagnosis not present

## 2019-08-18 DIAGNOSIS — R599 Enlarged lymph nodes, unspecified: Secondary | ICD-10-CM | POA: Diagnosis not present

## 2019-08-18 DIAGNOSIS — R000 Tachycardia, unspecified: Secondary | ICD-10-CM | POA: Diagnosis not present

## 2019-09-15 DIAGNOSIS — R000 Tachycardia, unspecified: Secondary | ICD-10-CM | POA: Diagnosis not present

## 2019-09-15 DIAGNOSIS — R599 Enlarged lymph nodes, unspecified: Secondary | ICD-10-CM | POA: Diagnosis not present

## 2019-09-22 ENCOUNTER — Other Ambulatory Visit: Payer: Self-pay | Admitting: Internal Medicine

## 2019-09-22 DIAGNOSIS — Z1231 Encounter for screening mammogram for malignant neoplasm of breast: Secondary | ICD-10-CM

## 2019-09-23 DIAGNOSIS — M1611 Unilateral primary osteoarthritis, right hip: Secondary | ICD-10-CM | POA: Diagnosis not present

## 2019-10-05 DIAGNOSIS — R52 Pain, unspecified: Secondary | ICD-10-CM | POA: Diagnosis not present

## 2019-10-05 DIAGNOSIS — R6883 Chills (without fever): Secondary | ICD-10-CM | POA: Diagnosis not present

## 2019-10-05 DIAGNOSIS — R5383 Other fatigue: Secondary | ICD-10-CM | POA: Diagnosis not present

## 2019-10-05 DIAGNOSIS — R05 Cough: Secondary | ICD-10-CM | POA: Diagnosis not present

## 2019-10-12 ENCOUNTER — Other Ambulatory Visit: Payer: Self-pay

## 2019-10-12 ENCOUNTER — Encounter (HOSPITAL_COMMUNITY): Payer: Self-pay | Admitting: Emergency Medicine

## 2019-10-12 ENCOUNTER — Ambulatory Visit (HOSPITAL_COMMUNITY)
Admission: EM | Admit: 2019-10-12 | Discharge: 2019-10-12 | Disposition: A | Discharge status: Home / Self Care | Payer: Medicare Other | Attending: Family Medicine | Admitting: Family Medicine

## 2019-10-12 DIAGNOSIS — Z20822 Contact with and (suspected) exposure to covid-19: Secondary | ICD-10-CM | POA: Diagnosis present

## 2019-10-12 NOTE — Discharge Instructions (Addendum)
Check for result on my chart Quarantine until test is available

## 2019-10-12 NOTE — ED Provider Notes (Signed)
[place]    CSN: [id number] Arrival date & time: 10/12/19  0846      History   Chief Complaint Chief Complaint  Patient presents with  . Covid Test    HPI Sandra Conrad is a 64 y.o. female.   HPI  Patient is here for Covid test.  She had a test done on 10/02/2019 that was negative.  It was a point-of-care testing was not followed up by send out.  She was having considerable symptoms at that time.  She suspects that she had Covid.  She is here today for the PCR follow-up test to confirm that she does not have Covid.  She does all return to work until she has this test.  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  Currently without symptoms  Past Medical History:  Diagnosis Date  . Abdominal pain    1 time in last 2 years ago/  . Anxiety   . Arthritis   . Blood transfusion without reported diagnosis 2005   1 time  . Cancer [facility]) 2011   Right breast IDC  . Complication of anesthesia    Pt request scopolamine patch  . Coronary artery disease    cardiac cath -15 % blockage  . Depression   . Diarrhea    last time in 2013  . Diverticulosis   . GERD (gastroesophageal reflux disease)   . Insomnia   . Lumbar herniated disc    L3-4 and L5  . Lumbar nerve root impingement    right leg  . OA (osteoarthritis)    right hip and spine  . Osteoporosis   . Personal history of chemotherapy    oral/on meds for 4 years  . Personal history of radiation therapy 2008   had 35 treatments/ last on 2008  . PONV (postoperative nausea and vomiting)     Patient Active Problem List   Diagnosis Date Noted  . AVN of femur ([place]) 03/30/2019  . Major depressive disorder, recurrent severe without psychotic features ([place]) 05/02/2015  . Suicidal ideation   . Abdominal pain   . Diarrhea     Past Surgical History:  Procedure Laterality Date  . BACK SURGERY  08/21/2018   L4-L5 and laminectomy  . BREAST EXCISIONAL BIOPSY Right 2008  . BREAST LUMPECTOMY  2008   right breast   . CARDIAC CATHETERIZATION  2012   no stents/no blockages  . DILATION AND CURETTAGE OF UTERUS  1982  . HIP SURGERY  2005   left hip x 2 ( pt report that first hip failed, revision was needed)  . TOTAL HIP ARTHROPLASTY Right 03/31/2019   Procedure: Right Anterior Total Hip Arthroplasty;  Surgeon: Frederik Pear, MD;  Location: [facility];  Service: Orthopedics;  Laterality: Right;  . WRIST SURGERY  2007   right wrist    OB History   No obstetric history on file.      Home Medications    Prior to Admission medications   Medication Sig Start Date End Date Taking? Authorizing Provider  atorvastatin (LIPITOR) 20 MG tablet Take 20 mg by mouth every evening.    Yes [provider]  clonazePAM (KLONOPIN) 1 MG tablet Take 1 tablet (1 mg total) by mouth 3 (three) times daily as needed for anxiety. Patient taking differently: Take 0.5-1 mg by mouth 2 (two) times daily as needed for anxiety.  05/03/15  Yes Pucilowska, Jolanta B, MD  gabapentin (NEURONTIN) 300 MG capsule Take 300 mg by mouth  3 (three) times daily.   Yes [provider]  lamoTRIgine (LAMICTAL) 150 MG tablet Take 150 mg by mouth daily.   Yes [provider]  propranolol (INDERAL) 10 MG tablet Take 10 mg by mouth daily.   Yes [provider]  traMADol (ULTRAM) 50 MG tablet Take 50 mg by mouth every 6 (six) hours as needed.   Yes [provider]  celecoxib (CELEBREX) 200 MG capsule Take 200 mg by mouth 2 (two) times daily.  05/03/19   [provider]    Family History Family History  Problem Relation Age of Onset  . Colon cancer Mother   . Heart attack Father   . Bipolar disorder Sister   . Colon polyps Sister   . Heart disease Brother   . Colon polyps Brother   . Transient ischemic attack Brother   . Esophageal cancer Neg Hx   . Stomach cancer Neg Hx   . Rectal cancer Neg Hx     Social History Social History   Tobacco Use  . Smoking status: Light Tobacco Smoker     Years: 20.00    Types: Cigarettes  . Smokeless tobacco: Never Used  . Tobacco comment: smokes 10 cigarettes a day  Substance Use Topics  . Alcohol use: No  . Drug use: Yes    Types: Benzodiazepines     Allergies   Cefdinir   Review of Systems Review of Systems  Constitutional: Negative for chills and fever.  HENT: Negative for congestion and hearing loss.   Eyes: Negative for pain.  Respiratory: Negative for cough and shortness of breath.   Cardiovascular: Negative for chest pain and leg swelling.  Gastrointestinal: Negative for abdominal pain, constipation and diarrhea.  Genitourinary: Negative for dysuria and frequency.  Musculoskeletal: Negative for myalgias.  Neurological: Negative for dizziness, seizures and headaches.  Psychiatric/Behavioral: The patient is not nervous/anxious.    No fever chills, body aches, cough or shortness of breath  Physical Exam Triage Vital Signs ED Triage Vitals  Enc Vitals Group     BP 10/12/19 1016 (!) 107/45     Pulse Rate 10/12/19 1016 64     Resp 10/12/19 1016 12     Temp 10/12/19 1016 (!) 97.5 F (36.4 C)     Temp Source 10/12/19 1016 Oral     SpO2 10/12/19 1016 98 %     Weight --      Height --      Head Circumference --      Peak Flow --      Pain Score 10/12/19 1013 0     Pain Loc --      Pain Edu? --      Excl. in [place]? --    No data found.  Updated Vital Signs BP (!) 107/45 (BP Location: Right Arm)   Pulse 64   Temp (!) 97.5 F (36.4 C) (Oral)   Resp 12   SpO2 98%   Visual Acuity Right Eye Distance:   Left Eye Distance:   Bilateral Distance:    Right Eye Near:   Left Eye Near:    Bilateral Near:     Physical Exam Constitutional:      General: She is not in acute distress.    Appearance: She is well-developed.  HENT:     Head: Normocephalic and atraumatic.     Mouth/Throat:     Comments: Mask in place Eyes:     Conjunctiva/sclera: Conjunctivae normal.     Pupils:  Pupils are equal, round, and  reactive to light.  Cardiovascular:     Rate and Rhythm: Normal rate.  Pulmonary:     Effort: Pulmonary effort is normal. No respiratory distress.  Abdominal:     General: There is no distension.     Palpations: Abdomen is soft.  Musculoskeletal:        General: Normal range of motion.     Cervical back: Normal range of motion.  Skin:    General: Skin is warm and dry.  Neurological:     Mental Status: She is alert.  Psychiatric:        Behavior: Behavior normal.      UC Treatments / Results  Labs (all labs ordered are listed, but only abnormal results are displayed) Labs Reviewed  NOVEL CORONAVIRUS, NAA (HOSP ORDER, SEND-OUT TO REF LAB; TAT 18-24 HRS)    EKG   Radiology No results found.  Procedures Procedures (including critical care time)  Medications Ordered in UC Medications - No data to display  Initial Impression / Assessment and Plan / UC Course  I have reviewed the triage vital signs and the nursing notes.  Pertinent labs & imaging results that were available during my care of the patient were reviewed by me and considered in my medical decision making (see chart for details).     Reviewed the need to quarantine until test results are available..  How to get her test results online Final Clinical Impressions(s) / UC Diagnoses   Final diagnoses:  Encounter for laboratory testing for COVID-19 virus     Discharge Instructions     Check for result on my chart Quarantine until test is available   ED Prescriptions    None     I have reviewed the PDMP during this encounter.   Raylene Everts, MD 10/12/19 1104

## 2019-10-12 NOTE — ED Triage Notes (Signed)
Pt here for final covid test.  She had a PCR done on Dec. 26 that was negative, then had symptoms after.  Pt then had a Rapid test that was negative.  Her job would like her to have one more PCR for sure.  Pt last had symptoms 9 days ago.  She denies any symptoms now.

## 2019-10-13 LAB — NOVEL CORONAVIRUS, NAA (HOSP ORDER, SEND-OUT TO REF LAB; TAT 18-24 HRS): SARS-CoV-2, NAA: NOT DETECTED

## 2019-11-11 ENCOUNTER — Ambulatory Visit
Admission: RE | Admit: 2019-11-11 | Discharge: 2019-11-11 | Disposition: A | Discharge status: Home / Self Care | Payer: BC Managed Care – PPO | Source: Ambulatory Visit | Attending: Internal Medicine | Admitting: Internal Medicine

## 2019-11-11 ENCOUNTER — Other Ambulatory Visit: Payer: Self-pay

## 2019-11-11 DIAGNOSIS — Z1231 Encounter for screening mammogram for malignant neoplasm of breast: Secondary | ICD-10-CM | POA: Diagnosis not present

## 2020-02-17 DIAGNOSIS — M545 Low back pain: Secondary | ICD-10-CM | POA: Diagnosis not present

## 2020-03-01 DIAGNOSIS — M545 Low back pain: Secondary | ICD-10-CM | POA: Diagnosis not present

## 2020-03-02 DIAGNOSIS — M545 Low back pain: Secondary | ICD-10-CM | POA: Diagnosis not present

## 2020-03-30 DIAGNOSIS — M545 Low back pain: Secondary | ICD-10-CM | POA: Diagnosis not present

## 2020-03-30 DIAGNOSIS — M5416 Radiculopathy, lumbar region: Secondary | ICD-10-CM | POA: Diagnosis not present

## 2020-04-03 DIAGNOSIS — G43009 Migraine without aura, not intractable, without status migrainosus: Secondary | ICD-10-CM | POA: Diagnosis not present

## 2020-04-03 DIAGNOSIS — R000 Tachycardia, unspecified: Secondary | ICD-10-CM | POA: Diagnosis not present

## 2020-04-04 DIAGNOSIS — M5416 Radiculopathy, lumbar region: Secondary | ICD-10-CM | POA: Diagnosis not present

## 2020-05-03 DIAGNOSIS — M5416 Radiculopathy, lumbar region: Secondary | ICD-10-CM | POA: Diagnosis not present

## 2020-05-03 DIAGNOSIS — M545 Low back pain: Secondary | ICD-10-CM | POA: Diagnosis not present

## 2020-05-10 DIAGNOSIS — S81812A Laceration without foreign body, left lower leg, initial encounter: Secondary | ICD-10-CM | POA: Diagnosis not present

## 2020-05-18 DIAGNOSIS — Z96643 Presence of artificial hip joint, bilateral: Secondary | ICD-10-CM | POA: Diagnosis not present

## 2020-05-18 DIAGNOSIS — G894 Chronic pain syndrome: Secondary | ICD-10-CM | POA: Diagnosis not present

## 2020-05-18 DIAGNOSIS — S0101XA Laceration without foreign body of scalp, initial encounter: Secondary | ICD-10-CM | POA: Diagnosis not present

## 2020-05-18 DIAGNOSIS — Z136 Encounter for screening for cardiovascular disorders: Secondary | ICD-10-CM | POA: Diagnosis not present

## 2020-05-18 DIAGNOSIS — E559 Vitamin D deficiency, unspecified: Secondary | ICD-10-CM | POA: Diagnosis not present

## 2020-05-18 DIAGNOSIS — R55 Syncope and collapse: Secondary | ICD-10-CM | POA: Diagnosis not present

## 2020-05-18 DIAGNOSIS — E782 Mixed hyperlipidemia: Secondary | ICD-10-CM | POA: Diagnosis not present

## 2020-05-23 DIAGNOSIS — Z0001 Encounter for general adult medical examination with abnormal findings: Secondary | ICD-10-CM | POA: Diagnosis not present

## 2020-05-23 DIAGNOSIS — G43009 Migraine without aura, not intractable, without status migrainosus: Secondary | ICD-10-CM | POA: Diagnosis not present

## 2020-06-27 DIAGNOSIS — E782 Mixed hyperlipidemia: Secondary | ICD-10-CM | POA: Diagnosis not present

## 2020-06-27 DIAGNOSIS — R000 Tachycardia, unspecified: Secondary | ICD-10-CM | POA: Diagnosis not present

## 2020-06-27 DIAGNOSIS — E559 Vitamin D deficiency, unspecified: Secondary | ICD-10-CM | POA: Diagnosis not present

## 2020-06-27 DIAGNOSIS — I959 Hypotension, unspecified: Secondary | ICD-10-CM | POA: Diagnosis not present

## 2020-08-03 DIAGNOSIS — M5416 Radiculopathy, lumbar region: Secondary | ICD-10-CM | POA: Diagnosis not present

## 2020-08-21 DIAGNOSIS — M5416 Radiculopathy, lumbar region: Secondary | ICD-10-CM | POA: Diagnosis not present

## 2020-08-21 DIAGNOSIS — M5126 Other intervertebral disc displacement, lumbar region: Secondary | ICD-10-CM | POA: Diagnosis not present

## 2020-10-17 DIAGNOSIS — E559 Vitamin D deficiency, unspecified: Secondary | ICD-10-CM | POA: Diagnosis not present

## 2020-10-17 DIAGNOSIS — R000 Tachycardia, unspecified: Secondary | ICD-10-CM | POA: Diagnosis not present

## 2020-10-17 DIAGNOSIS — G43009 Migraine without aura, not intractable, without status migrainosus: Secondary | ICD-10-CM | POA: Diagnosis not present

## 2020-10-17 DIAGNOSIS — E782 Mixed hyperlipidemia: Secondary | ICD-10-CM | POA: Diagnosis not present

## 2020-10-17 DIAGNOSIS — I959 Hypotension, unspecified: Secondary | ICD-10-CM | POA: Diagnosis not present

## 2020-11-06 DIAGNOSIS — M5416 Radiculopathy, lumbar region: Secondary | ICD-10-CM | POA: Diagnosis not present

## 2020-11-09 ENCOUNTER — Other Ambulatory Visit: Payer: Self-pay | Admitting: Dermatology

## 2020-11-09 DIAGNOSIS — Z1231 Encounter for screening mammogram for malignant neoplasm of breast: Secondary | ICD-10-CM

## 2020-11-20 ENCOUNTER — Ambulatory Visit: Payer: Medicare Other | Admitting: Podiatry

## 2020-11-20 ENCOUNTER — Other Ambulatory Visit: Payer: Self-pay

## 2020-11-20 ENCOUNTER — Encounter: Payer: Self-pay | Admitting: Podiatry

## 2020-11-20 ENCOUNTER — Ambulatory Visit (INDEPENDENT_AMBULATORY_CARE_PROVIDER_SITE_OTHER): Payer: Medicare Other

## 2020-11-20 DIAGNOSIS — M19072 Primary osteoarthritis, left ankle and foot: Secondary | ICD-10-CM

## 2020-11-20 DIAGNOSIS — M19079 Primary osteoarthritis, unspecified ankle and foot: Secondary | ICD-10-CM

## 2020-11-20 DIAGNOSIS — M79672 Pain in left foot: Secondary | ICD-10-CM

## 2020-11-20 NOTE — Progress Notes (Signed)
  Subjective:  Patient ID: Sandra Conrad, female    DOB: March 10, 1956,  MRN: [id number]  Chief Complaint  Patient presents with  . Pain    Lt dorsal foot pain with a growth formation x 1 yr ;' 5/10 sharp pains -no injury -no numbness/tingling -worse past 4 wks; worse w/ shoes Tx: icing/heat, compression and tylenol     65 y.o. female presents with the above complaint. History confirmed with patient.   Objective:  Physical Exam: warm, good capillary refill, no trophic changes or ulcerative lesions, normal DP and PT pulses and normal sensory exam. Left Foot: Palpable bony spurring over the tarsometatarsal and midtarsal joints, there is significant edema over this area with warmth and pain  Radiographs: X-ray of the left foot: Significant degenerative changes in the midtarsal and tarsometatarsal joints with dorsal spurring Assessment:   1. Arthritis of midfoot      Plan:  Patient was evaluated and treated and all questions answered.   Discussed etiology and treatment options for midfoot arthritis including the flu shoes have orthoses in surgical treatment.  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  Would like to order an MRI to evaluate the extent and severity of the arthritis in the able to develop a comprehensive plan to treat this definitively.  In the interim she may WBAT in a CAM boot which I dispensed today for support.  She has an upcoming trip I will see her back before then.  No follow-ups on file.

## 2020-11-21 ENCOUNTER — Telehealth: Payer: Self-pay | Admitting: *Deleted

## 2020-11-21 ENCOUNTER — Other Ambulatory Visit: Payer: Self-pay | Admitting: Podiatry

## 2020-11-21 DIAGNOSIS — M19079 Primary osteoarthritis, unspecified ankle and foot: Secondary | ICD-10-CM

## 2020-11-21 MED ORDER — MELOXICAM 15 MG PO TABS: 15.0000 mg | ORAL_TABLET | Freq: Every day | ORAL | 3 refills | Status: DC

## 2020-11-21 NOTE — Telephone Encounter (Signed)
Sent to pharmacy in prior encounter thank you

## 2020-11-21 NOTE — Telephone Encounter (Signed)
Patient is calling for the status of a prescription(Mobic-15mg ) that was supposed to be sent to her pharmacy 1 day ago. Please advise.

## 2020-11-21 NOTE — Addendum Note (Signed)
Addended bySherryle Lis, Beda Dula R on: 11/21/2020 08:40 PM   Modules accepted: Orders

## 2020-12-01 ENCOUNTER — Other Ambulatory Visit: Payer: Self-pay

## 2020-12-01 ENCOUNTER — Ambulatory Visit
Admission: RE | Admit: 2020-12-01 | Discharge: 2020-12-01 | Disposition: A | Discharge status: Home / Self Care | Payer: Medicare Other | Source: Ambulatory Visit | Attending: Podiatry | Admitting: Podiatry

## 2020-12-01 ENCOUNTER — Telehealth (INDEPENDENT_AMBULATORY_CARE_PROVIDER_SITE_OTHER): Payer: Medicare Other | Admitting: Podiatry

## 2020-12-01 DIAGNOSIS — M19072 Primary osteoarthritis, left ankle and foot: Secondary | ICD-10-CM | POA: Diagnosis not present

## 2020-12-01 DIAGNOSIS — M79673 Pain in unspecified foot: Secondary | ICD-10-CM

## 2020-12-01 DIAGNOSIS — M19079 Primary osteoarthritis, unspecified ankle and foot: Secondary | ICD-10-CM

## 2020-12-01 DIAGNOSIS — S93492A Sprain of other ligament of left ankle, initial encounter: Secondary | ICD-10-CM | POA: Diagnosis not present

## 2020-12-01 DIAGNOSIS — M7989 Other specified soft tissue disorders: Secondary | ICD-10-CM | POA: Diagnosis not present

## 2020-12-01 DIAGNOSIS — M25475 Effusion, left foot: Secondary | ICD-10-CM | POA: Diagnosis not present

## 2020-12-01 NOTE — Telephone Encounter (Signed)
Pt states her foot still swells and is painful to the touch. She states the medicine isn't working and would like to know next steps. Please advise.

## 2020-12-04 ENCOUNTER — Telehealth: Payer: Self-pay

## 2020-12-04 DIAGNOSIS — M5416 Radiculopathy, lumbar region: Secondary | ICD-10-CM | POA: Diagnosis not present

## 2020-12-04 DIAGNOSIS — M5126 Other intervertebral disc displacement, lumbar region: Secondary | ICD-10-CM | POA: Diagnosis not present

## 2020-12-04 NOTE — Telephone Encounter (Signed)
I spoke with her earlier and discussed with her

## 2020-12-04 NOTE — Telephone Encounter (Signed)
Pt is experiencing  GI issues from the meloxicam. Please would like something else to be recommended. Please advise

## 2020-12-04 NOTE — Addendum Note (Signed)
Addended bySherryle Lis, Clementine Soulliere R on: 12/04/2020 05:19 PM   Modules accepted: Level of Service

## 2020-12-04 NOTE — Telephone Encounter (Signed)
Spoke with patient by phone and reviewed the MRI findings with her.  We discussed that she likely will need surgery at this point we discussed other treatment options including a brace.  She should continue with the CAM boot for now for relief.  She said the meloxicam has not been helpful and she has had issues with NSAIDs in the past with GI upset with diverticulitis.  Advised her to continue her gabapentin, take 1000 mg of Tylenol daily.  We also discussed Celebrex which that this has not helped either and has also bothered her stomach.  At this point I do not see this improving conservatively and recommended surgical intervention with tarsometatarsal arthrodeses.  She said she would not be able to schedule surgery until mid May because she will be moving and she also is having epidural injections in the beginning of May.  She will keep her end of March appointment and we will give her consent and schedule at that time.  She inquired further about pain medication.  She said tramadol has not helped either.  She inquired if there is something stronger we could prescribe for twice a day.  Discussed with her I do not treat or prescribe chronic pain medications, moreover I really only treat acute pain from injuries, fractures or postsurgical pain.  I discussed possibly a pain management referral that there are other modalities they can try, she said she has had issues with drugs like Cymbalta.  I think it be best for narcotic pain management then for her primary care doctor to manage directly since she has multiple medical issues contributing to the need for this.  I be happy to talk to him and discuss if he needs it.

## 2020-12-06 DIAGNOSIS — J209 Acute bronchitis, unspecified: Secondary | ICD-10-CM | POA: Diagnosis not present

## 2020-12-06 DIAGNOSIS — F17200 Nicotine dependence, unspecified, uncomplicated: Secondary | ICD-10-CM | POA: Diagnosis not present

## 2020-12-06 DIAGNOSIS — J45909 Unspecified asthma, uncomplicated: Secondary | ICD-10-CM | POA: Diagnosis not present

## 2020-12-27 ENCOUNTER — Ambulatory Visit: Payer: Medicare Other

## 2020-12-27 DIAGNOSIS — I959 Hypotension, unspecified: Secondary | ICD-10-CM | POA: Diagnosis not present

## 2020-12-27 DIAGNOSIS — E559 Vitamin D deficiency, unspecified: Secondary | ICD-10-CM | POA: Diagnosis not present

## 2020-12-27 DIAGNOSIS — R599 Enlarged lymph nodes, unspecified: Secondary | ICD-10-CM | POA: Diagnosis not present

## 2021-01-02 DIAGNOSIS — E559 Vitamin D deficiency, unspecified: Secondary | ICD-10-CM | POA: Diagnosis not present

## 2021-01-02 DIAGNOSIS — R000 Tachycardia, unspecified: Secondary | ICD-10-CM | POA: Diagnosis not present

## 2021-01-02 DIAGNOSIS — M5126 Other intervertebral disc displacement, lumbar region: Secondary | ICD-10-CM | POA: Diagnosis not present

## 2021-01-02 DIAGNOSIS — F17210 Nicotine dependence, cigarettes, uncomplicated: Secondary | ICD-10-CM | POA: Diagnosis not present

## 2021-01-02 DIAGNOSIS — I959 Hypotension, unspecified: Secondary | ICD-10-CM | POA: Diagnosis not present

## 2021-01-02 DIAGNOSIS — G43009 Migraine without aura, not intractable, without status migrainosus: Secondary | ICD-10-CM | POA: Diagnosis not present

## 2021-01-02 DIAGNOSIS — Z96642 Presence of left artificial hip joint: Secondary | ICD-10-CM | POA: Diagnosis not present

## 2021-01-02 DIAGNOSIS — E782 Mixed hyperlipidemia: Secondary | ICD-10-CM | POA: Diagnosis not present

## 2021-01-04 ENCOUNTER — Other Ambulatory Visit: Payer: Self-pay

## 2021-01-04 ENCOUNTER — Ambulatory Visit: Payer: Medicare Other | Admitting: Podiatry

## 2021-01-04 ENCOUNTER — Encounter: Payer: Self-pay | Admitting: Podiatry

## 2021-01-04 DIAGNOSIS — M19079 Primary osteoarthritis, unspecified ankle and foot: Secondary | ICD-10-CM | POA: Diagnosis not present

## 2021-01-04 DIAGNOSIS — Q66219 Congenital metatarsus primus varus, unspecified foot: Secondary | ICD-10-CM

## 2021-01-04 DIAGNOSIS — F17200 Nicotine dependence, unspecified, uncomplicated: Secondary | ICD-10-CM | POA: Diagnosis not present

## 2021-01-04 DIAGNOSIS — M21612 Bunion of left foot: Secondary | ICD-10-CM | POA: Diagnosis not present

## 2021-01-04 DIAGNOSIS — M2012 Hallux valgus (acquired), left foot: Secondary | ICD-10-CM

## 2021-01-04 NOTE — Progress Notes (Signed)
Subjective:  Patient ID: Sandra Conrad, female    DOB: July 31, 1956,  MRN: [id number]  Chief Complaint  Patient presents with  . Arthritis    PT stated that she is still having some pain with her feet    65 y.o. female returns for follow-up with the above complaint. History confirmed with patient.  She completed MRI and is here for review and surgical planning  Objective:  Physical Exam: warm, good capillary refill, no trophic changes or ulcerative lesions, normal DP and PT pulses and normal sensory exam. Left Foot: Palpable bony spurring over the tarsometatarsal and midtarsal joints, there is significant edema over this area with warmth and pain, similar to last visit  Radiographs: X-ray of the left foot: Significant degenerative changes in the midtarsal and tarsometatarsal joints with dorsal spurring  Study Result  Narrative & Impression  CLINICAL DATA:  Foot pain  EXAM: MRI OF THE LEFT FOOT WITHOUT CONTRAST  TECHNIQUE: Multiplanar, multisequence MR imaging of the left was performed. No intravenous contrast was administered.  COMPARISON:  None.  FINDINGS: Bones/Joint/Cartilage  Moderate to advanced osteoarthritis is seen at the first cuneiform metatarsal joint with joint space loss and subchondral cystic changes. There is also moderate osteoarthritis at the fourth and fifth metatarsal cuboid joint with subchondral cystic changes and joint space loss. A small joint effusion is seen at the first metatarsal cuneiform joint. No osseous fracture or areas of cortical destruction are seen. There is also increased signal seen within the medial cuneiform and navicular.  Ligaments  The Lisfranc and collateral ligaments are intact. However there appears to be increased signal seen within the Lisfranc ligament between the medial cuneiform and base of the second metatarsal. There is also increased signal seen between the interosseous cuboid cuneiform  ligament.  Muscles and Tendons  The the muscles surrounding the forefoot are intact without focal atrophy or tear. The flexor and extensor tendons are intact.  Soft tissues  Mild dorsal subcutaneous edema seen along the midfoot.  IMPRESSION: Moderate to advanced osteoarthritis at the first cuneiform metatarsal joint and fourth and fifth metatarsal cuboid joints.  Intrasubstance sprain of the first Lisfranc ligament and interosseous cuboid cuneiform ligament.  Mild dorsal soft tissue swelling seen along the midfoot.  Reactive marrow within the medial cuneiform and navicular   Electronically Signed   By: Prudencio Pair M.D.   On: 12/02/2020 19:49    Assessment:   1. Arthritis of midfoot   2. Hallux valgus with bunions, left   3. Metatarsus primus varus   4. Tobacco use disorder      Plan:  Patient was evaluated and treated and all questions answered.  Reviewed the clinical exam findings and MRI findings in detail with the patient.  We discussed further treatment including nonsurgical and surgical treatment.  She is interested in surgical correction, I do not think this will improve significantly without surgical treatment at this point..  Surgical we discussed the following procedures: Tarsometatarsal arthrodesis left foot with bone graft from calcaneus.  I think she at least needs 1 2 and 3 fused, I would prefer not to fuse the fourth and fifth tarsometatarsal joints if possible.  We discussed the risk, benefits and potential complications of this including but not limited to pain, swelling, infection, scar, numbness which may be temporary or permanent, chronic pain, stiffness, nerve pain or damage, wound healing problems, bone healing problems including delayed or non-union .  We also discussed the expected postoperative course including the period of nonweightbearing  and when she would be able to return to work.  Discussed with her that she is currently smoking and  that this increases her risk for postoperative wound complication as well as the risk of nonunion or delayed union.  I would like her to stop smoking prior to surgery.  She says she will be able to do this and is okay for her to use nicotine replacement therapy for the next 3 weeks, I will see her back on May 5 and we are tentatively planning for surgery on May 20 which I have reserved for her.  At her next visit we will plan to check a cotinine level and review informed consent and signed paperwork for surgery at that time.  She also has a history of vitamin D deficiency she is currently on good supplementation her vitamin levels have been checked recently.  We will recheck prior to surgery.  Return in 5 weeks (on 02/08/2021) for pre surgical planning visit .

## 2021-01-11 ENCOUNTER — Other Ambulatory Visit: Payer: Self-pay | Admitting: Podiatry

## 2021-01-11 DIAGNOSIS — R2689 Other abnormalities of gait and mobility: Secondary | ICD-10-CM

## 2021-01-17 ENCOUNTER — Telehealth: Payer: Self-pay | Admitting: Urology

## 2021-01-17 NOTE — Telephone Encounter (Addendum)
DOS - 7/8//22  LAPIDUS PROCEDURE INCLUDING BUNIONECTOMY LEFT - 28297 ARTHRODESIS LEFT --- 28730 BONE GRAFT LEFT --- 20900   [facility] EFFECTIVE DATE - 10/07/20   PLAN DEDUCTIBLE - $0.00 W/ $0.00 REMAINING OUT OF POCKET -  $3,900.00 W/  $3,591.08 REMAINING COINSURANCE - 0% COPAY - $295    PER [employer] WEB SITE FOR CPT CODES 97182, 28730 AND 09906,  Notification or Prior Authorization is not required for the requested servicesThis [facility] Advant[facility] members plan does not currently require a prior authorization for these services.  Decision ID #:[id number]

## 2021-02-05 NOTE — Progress Notes (Signed)
Pt called to review quarantine instructions

## 2021-02-08 ENCOUNTER — Ambulatory Visit: Payer: Medicare Other | Admitting: Podiatry

## 2021-02-08 ENCOUNTER — Other Ambulatory Visit: Payer: Self-pay

## 2021-02-08 DIAGNOSIS — M19079 Primary osteoarthritis, unspecified ankle and foot: Secondary | ICD-10-CM

## 2021-02-08 DIAGNOSIS — F17200 Nicotine dependence, unspecified, uncomplicated: Secondary | ICD-10-CM

## 2021-02-08 DIAGNOSIS — Q66219 Congenital metatarsus primus varus, unspecified foot: Secondary | ICD-10-CM | POA: Diagnosis not present

## 2021-02-08 DIAGNOSIS — M2012 Hallux valgus (acquired), left foot: Secondary | ICD-10-CM

## 2021-02-08 DIAGNOSIS — M21612 Bunion of left foot: Secondary | ICD-10-CM

## 2021-02-09 ENCOUNTER — Encounter (HOSPITAL_BASED_OUTPATIENT_CLINIC_OR_DEPARTMENT_OTHER): Payer: Self-pay | Admitting: Podiatry

## 2021-02-09 ENCOUNTER — Other Ambulatory Visit: Payer: Self-pay

## 2021-02-09 NOTE — Progress Notes (Signed)
Spoke w/ via phone for pre-op interview---pt Lab needs dos---- I stat , ekg (ask mda dos has tachacardia, if needed, order entered in epic)              Lab results------none COVID test ------02-14-2021 1500 Arrive at ------1015 02-16-2021 - NPO after MN NO Solid Food.  Clear liquids from MN until---915 am then npo Med rec completed Medications to take morning of surgery ----gabapentin, lamictal, escitalopram, hydrocodone, omeprazole, propranolol prn- Diabetic medication -----n/a Patient instructed to bring photo id and insurance card day of surgery Patient aware to have Driver (ride ) / caregiver  Daughter kara schilling will stay   for 24 hours after surgery  Patient Special Instructions -----none Pre-Op special Istructions -----none Patient verbalized understanding of instructions that were given at this phone interview. Patient denies shortness of breath, chest pain, fever, cough at this phone interview.   H & P/ medical clearance  dr zack hall dated 01-18-2021 on chart for 02-16-2021 surgery.

## 2021-02-12 ENCOUNTER — Encounter: Payer: Self-pay | Admitting: Podiatry

## 2021-02-12 DIAGNOSIS — M5416 Radiculopathy, lumbar region: Secondary | ICD-10-CM | POA: Diagnosis not present

## 2021-02-12 NOTE — Progress Notes (Signed)
Subjective:  Patient ID: Sandra Conrad, female    DOB: Jan 08, 1956,  MRN: [id number]  Chief Complaint  Patient presents with  . Bunions    Surgical consult left foot  . Arthritis    65 y.o. female returns for follow-up with the above complaint. History confirmed with patient.  Surgery scheduled for next Friday, she has not been smoking since her last visit and plans to continue this after surgery  Objective:  Physical Exam: warm, good capillary refill, no trophic changes or ulcerative lesions, normal DP and PT pulses and normal sensory exam. Left Foot: Palpable bony spurring over the tarsometatarsal and midtarsal joints, there is significant edema over this area with warmth and pain, similar to last visit  Radiographs: X-ray of the left foot: Significant degenerative changes in the midtarsal and tarsometatarsal joints with dorsal spurring  Study Result  Narrative & Impression  CLINICAL DATA:  Foot pain  EXAM: MRI OF THE LEFT FOOT WITHOUT CONTRAST  TECHNIQUE: Multiplanar, multisequence MR imaging of the left was performed. No intravenous contrast was administered.  COMPARISON:  None.  FINDINGS: Bones/Joint/Cartilage  Moderate to advanced osteoarthritis is seen at the first cuneiform metatarsal joint with joint space loss and subchondral cystic changes. There is also moderate osteoarthritis at the fourth and fifth metatarsal cuboid joint with subchondral cystic changes and joint space loss. A small joint effusion is seen at the first metatarsal cuneiform joint. No osseous fracture or areas of cortical destruction are seen. There is also increased signal seen within the medial cuneiform and navicular.  Ligaments  The Lisfranc and collateral ligaments are intact. However there appears to be increased signal seen within the Lisfranc ligament between the medial cuneiform and base of the second metatarsal. There is also increased signal seen between the  interosseous cuboid cuneiform ligament.  Muscles and Tendons  The the muscles surrounding the forefoot are intact without focal atrophy or tear. The flexor and extensor tendons are intact.  Soft tissues  Mild dorsal subcutaneous edema seen along the midfoot.  IMPRESSION: Moderate to advanced osteoarthritis at the first cuneiform metatarsal joint and fourth and fifth metatarsal cuboid joints.  Intrasubstance sprain of the first Lisfranc ligament and interosseous cuboid cuneiform ligament.  Mild dorsal soft tissue swelling seen along the midfoot.  Reactive marrow within the medial cuneiform and navicular   Electronically Signed   By: Prudencio Pair M.D.   On: 12/02/2020 19:49    Assessment:   1. Arthritis of midfoot   2. Tobacco use disorder   3. Hallux valgus with bunions, left   4. Metatarsus primus varus      Plan:  Patient was evaluated and treated and all questions answered.  Reviewed the clinical exam findings and MRI findings in detail with the patient.  We reviewed the surgical plan, we discussed the following procedures: Tarsometatarsal arthrodesis first second and third left foot with bone graft from calcaneus.  We discussed the risk, benefits and potential complications of this including but not limited to pain, swelling, infection, scar, numbness which may be temporary or permanent, chronic pain, stiffness, nerve pain or damage, wound healing problems, bone healing problems including delayed or non-union.  We also discussed the expected postoperative course including the period of nonweightbearing and when she would be able to return to work.  Informed consent was signed and reviewed.  Cotinine level was ordered.   Surgical plan:  Procedure: -First second third tarsometatarsal fusions left foot with calcaneal autograft  Location: -[place]  Anesthesia plan: -General with regional block  Postoperative pain plan: - Tylenol  1000 mg every 6 hours, ibuprofen 600 mg every 6 hours, gabapentin 300 mg every 8 hours x5 days, oxycodone 5 mg 1-2 tabs every 6 hours only as needed  DVT prophylaxis: -Xarelto  WB Restrictions / DME needs: -NWB in posterior splint   No follow-ups on file.

## 2021-02-13 ENCOUNTER — Encounter: Payer: Self-pay | Admitting: Podiatry

## 2021-02-14 ENCOUNTER — Other Ambulatory Visit (HOSPITAL_COMMUNITY): Payer: Medicare Other

## 2021-02-14 LAB — NICOTINE/COTININE METABOLITES
Cotinine: <1 ng/mL
Nicotine: <1 ng/mL

## 2021-02-15 ENCOUNTER — Ambulatory Visit
Admission: RE | Admit: 2021-02-15 | Discharge: 2021-02-15 | Disposition: A | Discharge status: Home / Self Care | Payer: Medicare Other | Source: Ambulatory Visit | Attending: Dermatology | Admitting: Dermatology

## 2021-02-15 ENCOUNTER — Other Ambulatory Visit: Payer: Self-pay

## 2021-02-15 ENCOUNTER — Other Ambulatory Visit: Payer: Self-pay | Admitting: Internal Medicine

## 2021-02-15 DIAGNOSIS — Z1231 Encounter for screening mammogram for malignant neoplasm of breast: Secondary | ICD-10-CM

## 2021-02-22 ENCOUNTER — Encounter: Payer: Medicare Other | Admitting: Podiatry

## 2021-02-23 ENCOUNTER — Telehealth: Payer: Self-pay

## 2021-02-23 NOTE — Telephone Encounter (Signed)
Sandra Conrad called to cancel her surgery with Dr. Sherryle Lis on 04/13/2021. She stated she doesn't have anyone that can stay with her after the surgery and she also stated that since wearing the boot her foot is feeling better. I told her to call us back when she is ready to reschedule. I notified Dr. Sherryle Lis

## 2021-03-06 ENCOUNTER — Encounter: Payer: Medicare Other | Admitting: Podiatry

## 2021-03-20 ENCOUNTER — Encounter: Payer: Medicare Other | Admitting: Podiatry

## 2021-04-04 DIAGNOSIS — M79672 Pain in left foot: Secondary | ICD-10-CM | POA: Diagnosis not present

## 2021-04-13 ENCOUNTER — Ambulatory Visit (HOSPITAL_BASED_OUTPATIENT_CLINIC_OR_DEPARTMENT_OTHER): Admit: 2021-04-13 | Payer: Medicare Other | Admitting: Podiatry

## 2021-04-13 HISTORY — DX: Unspecified mononeuropathy of unspecified lower limb: G57.90

## 2021-04-13 HISTORY — DX: Migraine, unspecified, not intractable, without status migrainosus: G43.909

## 2021-04-13 HISTORY — DX: Cardiac arrhythmia, unspecified: I49.9

## 2021-04-13 HISTORY — DX: Presence of spectacles and contact lenses: Z97.3

## 2021-04-13 SURGERY — BUNIONECTOMY
Anesthesia: General | Site: Toe | Laterality: Left

## 2021-04-19 ENCOUNTER — Encounter: Payer: Medicare Other | Admitting: Podiatry

## 2021-04-25 DIAGNOSIS — M5126 Other intervertebral disc displacement, lumbar region: Secondary | ICD-10-CM | POA: Diagnosis not present

## 2021-04-25 DIAGNOSIS — M5416 Radiculopathy, lumbar region: Secondary | ICD-10-CM | POA: Diagnosis not present

## 2021-04-26 DIAGNOSIS — M25559 Pain in unspecified hip: Secondary | ICD-10-CM | POA: Diagnosis not present

## 2021-04-26 DIAGNOSIS — Z96643 Presence of artificial hip joint, bilateral: Secondary | ICD-10-CM | POA: Diagnosis not present

## 2021-05-03 ENCOUNTER — Encounter: Payer: Medicare Other | Admitting: Podiatry

## 2021-05-24 ENCOUNTER — Encounter: Payer: Medicare Other | Admitting: Podiatry

## 2021-07-02 DIAGNOSIS — R7301 Impaired fasting glucose: Secondary | ICD-10-CM | POA: Diagnosis not present

## 2021-07-02 DIAGNOSIS — E782 Mixed hyperlipidemia: Secondary | ICD-10-CM | POA: Diagnosis not present

## 2021-07-02 DIAGNOSIS — I959 Hypotension, unspecified: Secondary | ICD-10-CM | POA: Diagnosis not present

## 2021-07-05 DIAGNOSIS — E559 Vitamin D deficiency, unspecified: Secondary | ICD-10-CM | POA: Diagnosis not present

## 2021-07-05 DIAGNOSIS — Z23 Encounter for immunization: Secondary | ICD-10-CM | POA: Diagnosis not present

## 2021-07-05 DIAGNOSIS — R000 Tachycardia, unspecified: Secondary | ICD-10-CM | POA: Diagnosis not present

## 2021-07-05 DIAGNOSIS — M13872 Other specified arthritis, left ankle and foot: Secondary | ICD-10-CM | POA: Diagnosis not present

## 2021-07-05 DIAGNOSIS — F17210 Nicotine dependence, cigarettes, uncomplicated: Secondary | ICD-10-CM | POA: Diagnosis not present

## 2021-07-05 DIAGNOSIS — M5136 Other intervertebral disc degeneration, lumbar region: Secondary | ICD-10-CM | POA: Diagnosis not present

## 2021-07-05 DIAGNOSIS — G43009 Migraine without aura, not intractable, without status migrainosus: Secondary | ICD-10-CM | POA: Diagnosis not present

## 2021-07-05 DIAGNOSIS — Z0001 Encounter for general adult medical examination with abnormal findings: Secondary | ICD-10-CM | POA: Diagnosis not present

## 2021-07-05 DIAGNOSIS — E782 Mixed hyperlipidemia: Secondary | ICD-10-CM | POA: Diagnosis not present

## 2021-07-23 DIAGNOSIS — M5416 Radiculopathy, lumbar region: Secondary | ICD-10-CM | POA: Diagnosis not present

## 2021-08-07 ENCOUNTER — Encounter (HOSPITAL_COMMUNITY): Payer: Self-pay | Admitting: *Deleted

## 2021-08-07 ENCOUNTER — Emergency Department (HOSPITAL_COMMUNITY)
Admission: EM | Admit: 2021-08-07 | Discharge: 2021-08-07 | Disposition: A | Discharge status: Home / Self Care | Payer: Medicare Other | Attending: Emergency Medicine | Admitting: Emergency Medicine

## 2021-08-07 ENCOUNTER — Other Ambulatory Visit: Payer: Self-pay

## 2021-08-07 ENCOUNTER — Emergency Department (HOSPITAL_COMMUNITY): Payer: Medicare Other

## 2021-08-07 DIAGNOSIS — Z853 Personal history of malignant neoplasm of breast: Secondary | ICD-10-CM | POA: Insufficient documentation

## 2021-08-07 DIAGNOSIS — F17210 Nicotine dependence, cigarettes, uncomplicated: Secondary | ICD-10-CM | POA: Diagnosis not present

## 2021-08-07 DIAGNOSIS — R1084 Generalized abdominal pain: Secondary | ICD-10-CM | POA: Diagnosis not present

## 2021-08-07 DIAGNOSIS — Z96641 Presence of right artificial hip joint: Secondary | ICD-10-CM | POA: Insufficient documentation

## 2021-08-07 DIAGNOSIS — K5730 Diverticulosis of large intestine without perforation or abscess without bleeding: Secondary | ICD-10-CM | POA: Diagnosis not present

## 2021-08-07 DIAGNOSIS — D7389 Other diseases of spleen: Secondary | ICD-10-CM | POA: Diagnosis not present

## 2021-08-07 DIAGNOSIS — D71 Functional disorders of polymorphonuclear neutrophils: Secondary | ICD-10-CM | POA: Diagnosis not present

## 2021-08-07 DIAGNOSIS — R109 Unspecified abdominal pain: Secondary | ICD-10-CM | POA: Diagnosis present

## 2021-08-07 DIAGNOSIS — I700 Atherosclerosis of aorta: Secondary | ICD-10-CM | POA: Diagnosis not present

## 2021-08-07 LAB — COMPREHENSIVE METABOLIC PANEL
ALT: 28 U/L (ref 0–44)
AST: 18 U/L (ref 15–41)
Albumin: 3.8 g/dL (ref 3.5–5.0)
Alkaline Phosphatase: 64 U/L (ref 38–126)
Anion gap: 6 (ref 5–15)
BUN: 13 mg/dL (ref 8–23)
CO2: 27 mmol/L (ref 22–32)
Calcium: 8.8 mg/dL — ABNORMAL LOW (ref 8.9–10.3)
Chloride: 105 mmol/L (ref 98–111)
Creatinine, Ser: 0.63 mg/dL (ref 0.44–1.00)
GFR, Estimated: >60 mL/min (ref >60)
Glucose, Bld: 97 mg/dL (ref 70–99)
Potassium: 3.9 mmol/L (ref 3.5–5.1)
Sodium: 138 mmol/L (ref 135–145)
Total Bilirubin: 0.8 mg/dL (ref 0.3–1.2)
Total Protein: 6.7 g/dL (ref 6.5–8.1)

## 2021-08-07 LAB — CBC
HCT: 42.8 % (ref 36.0–46.0)
Hemoglobin: 13.9 g/dL (ref 12.0–15.0)
MCH: 33.2 pg (ref 26.0–34.0)
MCHC: 32.5 g/dL (ref 30.0–36.0)
MCV: 102.1 fL — ABNORMAL HIGH (ref 80.0–100.0)
Platelets: 340 10*3/uL (ref 150–400)
RBC: 4.19 MIL/uL (ref 3.87–5.11)
RDW: 13.5 % (ref 11.5–15.5)
WBC: 6.7 10*3/uL (ref 4.0–10.5)
nRBC: 0 % (ref 0.0–0.2)

## 2021-08-07 LAB — LIPASE, BLOOD: Lipase: 35 U/L (ref 11–51)

## 2021-08-07 MED ORDER — DICYCLOMINE HCL 20 MG PO TABS: 20.0000 mg | ORAL_TABLET | Freq: Two times a day (BID) | ORAL | 0 refills | Status: DC | PRN

## 2021-08-07 MED ORDER — ONDANSETRON HCL 4 MG/2ML IJ SOLN
4.0000 mg | Freq: Once | INTRAMUSCULAR | Status: AC
Start: 2021-08-07 — End: 2021-08-07
  Administered 2021-08-07: 4 mg via INTRAVENOUS
  Filled 2021-08-07: qty 2

## 2021-08-07 MED ORDER — IOHEXOL 300 MG/ML  SOLN
100.0000 mL | Freq: Once | INTRAMUSCULAR | Status: AC | PRN
  Administered 2021-08-07: 100 mL via INTRAVENOUS

## 2021-08-07 MED ORDER — ONDANSETRON 4 MG PO TBDP
4.0000 mg | ORAL_TABLET | Freq: Once | ORAL | Status: AC | PRN
  Administered 2021-08-07: 4 mg via ORAL
  Filled 2021-08-07: qty 1

## 2021-08-07 MED ORDER — ONDANSETRON HCL 4 MG PO TABS: 4.0000 mg | ORAL_TABLET | Freq: Three times a day (TID) | ORAL | 0 refills | Status: DC | PRN

## 2021-08-07 MED ORDER — SODIUM CHLORIDE 0.9 % IV BOLUS
500.0000 mL | Freq: Once | INTRAVENOUS | Status: AC
  Administered 2021-08-07: 500 mL via INTRAVENOUS

## 2021-08-07 MED ORDER — HYDROMORPHONE HCL 1 MG/ML IJ SOLN
1.0000 mg | Freq: Once | INTRAMUSCULAR | Status: AC
  Administered 2021-08-07: 1 mg via INTRAVENOUS
  Filled 2021-08-07: qty 1

## 2021-08-07 NOTE — ED Triage Notes (Signed)
Pt c/o generalized abdominal pain, mainly epigastric, n/v/d, abdominal distention and feeling like she is going to pass out since Sunday. Hx of diverticulitis but reports this feels different.

## 2021-08-07 NOTE — ED Notes (Signed)
Reminded pt I needed urine to call when ready

## 2021-08-07 NOTE — ED Provider Notes (Signed)
[facility] EMERGENCY DEPARTMENT Provider Note   CSN: [id number] Arrival date & time: 08/07/21  1305     History Chief Complaint  Patient presents with   Abdominal Pain    Sandra Conrad is a 65 y.o. female.  HPI  Patient with significant medical history including anxiety, right-sided breast cancer, diverticulosis, migraines, OA presents with chief complaint of stomach pain.  Patient pain started on Sunday and has gotten worse.  She states the pain is in her epigastric as well as her lower abdomen, pain is constant, but will have worsening pain when she has cramping in that area, she has had diarrhea but states this is normal for her, she does note that its been more explosive, she denies any hematochezia, melena, states she had a single episode of vomiting but denies hematemesis or coffee-ground emesis, she denies  urinary symptoms.  She has no significant abdominal history, has history of diverticulosis, but denies history of bowel obstructions, pancreatitis, gallbladder abnormalities, appendicitis, she denies history of PUD, NSAID use, alcohol use, no history of C. difficile, has not been on recent antibiotics, no recent hospitalizations.  She denies  alleviating or aggravating factors.  She denies systemic infection and fevers, chills, nasal congestion, sore throat, cough, general body aches.  after reviewed patient's chart had colonoscopy 3 years ago which was essentially unremarkable.  Past Medical History:  Diagnosis Date   Abdominal pain    1 time in last 2 years ago/   Anxiety    Arthritis    Blood transfusion without reported diagnosis 2005   1 time   Cancer [facility]) 2011   Right breast IDC   Complication of anesthesia    Pt request scopolamine patch   Depression    Diarrhea    last time in 2013   Diverticulosis    Dysrhythmia    tachacardia managed by pcp    GERD (gastroesophageal reflux disease)    Insomnia    Lumbar herniated disc    L3-4 and L5   Lumbar nerve  root impingement    right leg   Migraine    Neuropathy of foot    legs due to djd and spinal stenosis intermittent neuropathy   OA (osteoarthritis)    both hips and spine and left foot   Osteoporosis    Personal history of chemotherapy    oral/on meds for 4 years   Personal history of radiation therapy 2008   had 35 treatments/ last on 2008   PONV (postoperative nausea and vomiting)    extreme ponv uses scopolamine patch   Wears glasses     Patient Active Problem List   Diagnosis Date Noted   AVN of femur ([place]) 03/30/2019   Major depressive disorder, recurrent severe without psychotic features ([place]) 05/02/2015   Suicidal ideation    Abdominal pain    Diarrhea     Past Surgical History:  Procedure Laterality Date   BACK SURGERY  08/21/2018   L4-L5 and laminectomy   BREAST EXCISIONAL BIOPSY Right 2008   BREAST LUMPECTOMY  2008   right breast   CARDIAC CATHETERIZATION  2012   no stents/no blockages   DILATION AND CURETTAGE OF UTERUS  1982   HIP SURGERY Left jan 2005 and oct 2005   left hip x 2 ( pt report that first hip failed, revision was needed)   TOTAL HIP ARTHROPLASTY Right 03/31/2019   Procedure: Right Anterior Total Hip Arthroplasty;  Surgeon: Frederik Pear, MD;  Location: [facility];  Service: Orthopedics;  Laterality: Right;   WRIST SURGERY  2007   right wrist orif plates and 12 screws still in     OB History   No obstetric history on file.     Family History  Problem Relation Age of Onset   Colon cancer Mother    Heart attack Father    Bipolar disorder Sister    Colon polyps Sister    Heart disease Brother    Colon polyps Brother    Transient ischemic attack Brother    Esophageal cancer Neg Hx    Stomach cancer Neg Hx    Rectal cancer Neg Hx     Social History   Tobacco Use   Smoking status: Every Day    Packs/day: 0.50    Years: 30.00    Pack years: 15.00    Types: Cigarettes    Last attempt to quit: 01/05/2021    Years since quitting: 0.5    Smokeless tobacco: Never   Tobacco comments:    smokes 10 cigarettes a day  Vaping Use   Vaping Use: Never used  Substance Use Topics   Alcohol use: No   Drug use: Not Currently    Home Medications Prior to Admission medications   Medication Sig Start Date End Date Taking? Authorizing Provider  Cholecalciferol 1.25 MG (50000 UT) capsule Take 50,000 Units by mouth daily. Sunday   Yes [provider]  clonazePAM (KLONOPIN) 1 MG tablet Take 1 mg by mouth 2 (two) times daily.   Yes [provider]  dicyclomine (BENTYL) 20 MG tablet Take 1 tablet (20 mg total) by mouth 2 (two) times daily as needed for spasms. 08/07/21  Yes Marcello Fennel, PA-C  escitalopram (LEXAPRO) 10 MG tablet Take 10 mg by mouth daily.   Yes [provider]  gabapentin (NEURONTIN) 600 MG tablet Take 600 mg by mouth 3 (three) times daily. 07/18/21  Yes [provider]  lamoTRIgine (LAMICTAL) 150 MG tablet Take 150 mg by mouth daily.   Yes [provider]  Loperamide HCl (IMODIUM PO) Take by mouth as needed.   Yes [provider]  omeprazole (PRILOSEC) 20 MG capsule Take 20 mg by mouth as needed.   Yes [provider]  ondansetron (ZOFRAN) 4 MG tablet Take 1 tablet (4 mg total) by mouth every 8 (eight) hours as needed for nausea or vomiting. 08/07/21  Yes Marcello Fennel, PA-C  OVER THE COUNTER MEDICATION Vitamin d 1000 units daily   Yes [provider]  propranolol (INDERAL) 20 MG tablet Take 20 mg by mouth as needed. For tachacardia   Yes [provider]  rosuvastatin (CRESTOR) 20 MG tablet Take 40 mg by mouth every evening.   Yes [provider]  traMADol (ULTRAM) 50 MG tablet Take 50 mg by mouth every 6 (six) hours as needed. 07/13/21  Yes [provider]  gabapentin (NEURONTIN) 300 MG capsule Take 600 mg by mouth 3 (three) times daily. Patient not taking: Reported on 08/07/2021    [provider]   HYDROcodone-acetaminophen (NORCO) 7.5-325 MG tablet Take 1 tablet by mouth in the morning, at noon, and at bedtime. Patient not taking: Reported on 08/07/2021    [provider]  UNABLE TO FIND Vitamin d 50000 units q week saturday Patient not taking: Reported on 08/07/2021    [provider]    Allergies    Flagyl [metronidazole] and Cefdinir  Review of Systems   Review of Systems  Constitutional:  Negative for chills and fever.  HENT:  Negative for congestion.   Respiratory:  Negative for shortness of breath.   Cardiovascular:  Negative for chest pain.  Gastrointestinal:  Positive for abdominal pain, diarrhea, nausea and vomiting. Negative for blood in stool and constipation.  Genitourinary:  Negative for dysuria, enuresis, flank pain and frequency.  Musculoskeletal:  Negative for back pain and myalgias.  Skin:  Negative for rash.  Neurological:  Negative for dizziness and headaches.  Hematological:  Does not bruise/bleed easily.   Physical Exam Updated Vital Signs BP 110/74   Pulse (!) 58   Temp 98.1 F (36.7 C)   Resp 14   Ht '5\' 6"'  (1.676 m)   Wt 82.1 kg   SpO2 95%   BMI 29.21 kg/m   Physical Exam Vitals and nursing note reviewed.  Constitutional:      General: She is not in acute distress.    Appearance: She is not ill-appearing.  HENT:     Head: Normocephalic and atraumatic.     Nose: No congestion.  Eyes:     Conjunctiva/sclera: Conjunctivae normal.  Cardiovascular:     Rate and Rhythm: Normal rate and regular rhythm.     Pulses: Normal pulses.     Heart sounds: No murmur heard.   No friction rub. No gallop.  Pulmonary:     Effort: No respiratory distress.     Breath sounds: No wheezing, rhonchi or rales.  Abdominal:     General: There is no distension.     Palpations: Abdomen is soft.     Tenderness: There is no abdominal tenderness. There is no right CVA tenderness or left CVA tenderness.     Comments: Abdomen was nondistended,  normal bowel sounds, she had slight tenderness in her epigastric region as well as her lower abdomen, there is no guarding, rebound tenderness, peritoneal sign, negative Murphy sign, McBurney point or Rovsing.  Musculoskeletal:     Right lower leg: No edema.     Left lower leg: No edema.  Skin:    General: Skin is warm and dry.  Neurological:     Mental Status: She is alert.  Psychiatric:        Mood and Affect: Mood normal.    ED Results / Procedures / Treatments   Labs (all labs ordered are listed, but only abnormal results are displayed) Labs Reviewed  COMPREHENSIVE METABOLIC PANEL - Abnormal; Notable for the following components:      Result Value   Calcium 8.8 (*)    All other components within normal limits  CBC - Abnormal; Notable for the following components:   MCV 102.1 (*)    All other components within normal limits  LIPASE, BLOOD  URINALYSIS, ROUTINE W REFLEX MICROSCOPIC    EKG None  Radiology CT ABDOMEN PELVIS W CONTRAST  Result Date: 08/07/2021 CLINICAL DATA:  Concern for diverticulitis. EXAM: CT ABDOMEN AND PELVIS WITH CONTRAST TECHNIQUE: Multidetector CT imaging of the abdomen and pelvis was performed using the standard protocol following bolus administration of intravenous contrast. CONTRAST:  164m OMNIPAQUE IOHEXOL 300 MG/ML  SOLN COMPARISON:  CT abdomen pelvis dated 12/01/2015. FINDINGS: Lower chest: The visualized lung bases are clear. There is coronary vascular calcification. No intra-abdominal free air or free fluid. Hepatobiliary: No focal liver abnormality is seen. No gallstones, gallbladder wall thickening, or biliary dilatation. Pancreas: Unremarkable. No pancreatic ductal dilatation or surrounding inflammatory changes. Spleen: Small scattered calcified splenic granuloma. Adrenals/Urinary Tract: The adrenal glands are unremarkable. The kidneys,  visualized ureters, and the urinary bladder are unremarkable. Stomach/Bowel: There is sigmoid diverticulosis  without active inflammatory changes. There is no bowel obstruction or active inflammation. The appendix is normal. Vascular/Lymphatic: Moderate aortoiliac atherosclerotic disease. The IVC is unremarkable. No portal venous gas. There is no adenopathy. Reproductive: The uterus and ovaries are grossly unremarkable. No pelvic mass. Other: None Musculoskeletal: Degenerative changes of the spine. Bilateral hip arthroplasties. No acute osseous pathology. IMPRESSION: 1. No acute intra-abdominal or pelvic pathology. 2. Sigmoid diverticulosis. 3. Aortic Atherosclerosis (ICD10-I70.0). Electronically Signed   By: Anner Crete M.D.   On: 08/07/2021 18:25    Procedures Procedures   Medications Ordered in ED Medications  ondansetron (ZOFRAN-ODT) disintegrating tablet 4 mg (4 mg Oral Given 08/07/21 1404)  sodium chloride 0.9 % bolus 500 mL (0 mLs Intravenous Stopped 08/07/21 1727)  ondansetron (ZOFRAN) injection 4 mg (4 mg Intravenous Given 08/07/21 1725)  HYDROmorphone (DILAUDID) injection 1 mg (1 mg Intravenous Given 08/07/21 1726)  iohexol (OMNIPAQUE) 300 MG/ML solution 100 mL (100 mLs Intravenous Contrast Given 08/07/21 1820)    ED Course  I have reviewed the triage vital signs and the nursing notes.  Pertinent labs & imaging results that were available during my care of the patient were reviewed by me and considered in my medical decision making (see chart for details).    MDM Rules/Calculators/A&P                          Initial impression-patient presents with stomach pain, she is alert, does not appear in distress, vital signs are reassuring.  Triage obtain basic lab work-up, will add on CT abdomen pelvis rule out complicated diverticulitis, provide her with fluids, antiemetics, pain medications and reassess.  Work-up-CBC is unremarkable, CMP is unremarkable, lipase is 35, CT on pelvis negative for acute findings.    Reassessment-patient is reassessed, states pain has improved, has no other  complaints, will continue to monitor.  Patient reassessed updated on lab work and imaging, she continued to have no complaint, abdomen was soft nontender to palpation, she has no urinary symptoms, will defer further evaluation of UA.  Patient agreable for discharge at this time.  Rule out-low suspicion for systemic infection as patient nontoxic-appearing, vital signs reassuring, no leukocytosis, no obvious infection present on exam.  Low suspicion for bowel obstruction, intra-abdominal infection, appendicitis, complicated diverticulitis, [place], kidney stone as CT imaging negative for these findings.  Low suspicion for pancreatitis as lipase within normal limits.  Low suspicion for liver or gallbladder abnormality as she has no right upper quadrant tenderness, no elevation liver enzymes, alk phos, T bili.  I have low suspicion for UTI as patient has any urinary symptoms.  Low suspicion for dissection of the aortic aneurysm as presentation atypical of etiology.  Plan-  Stomach pain since resolved-unclear etiology but likely viral gastritis, will provide her with Bentyl, antiemetics, have her follow-up with GI for further evaluation, gave her strict return precautions.  Vital signs have remained stable, no indication for hospital admission.    Patient given at home care as well strict return precautions.  Patient verbalized that they understood agreed to said plan.  Final Clinical Impression(s) / ED Diagnoses Final diagnoses:  Generalized abdominal pain    Rx / DC Orders ED Discharge Orders          Ordered    ondansetron (ZOFRAN) 4 MG tablet  Every 8 hours PRN        08/07/21 1935  dicyclomine (BENTYL) 20 MG tablet  2 times daily PRN        08/07/21 1935             Aron Baba 08/07/21 2310    Hayden Rasmussen, MD 08/08/21 1100

## 2021-08-07 NOTE — Discharge Instructions (Signed)
Lab work and imaging all reassuring.  Given you Zofran for nausea, Bentyl for stomach spasms please take as prescribed.  Please follow-up with your GI doctor for further evaluation.  Come back to the emergency department if you develop chest pain, shortness of breath, severe abdominal pain, uncontrolled nausea, vomiting, diarrhea.

## 2021-08-07 NOTE — ED Notes (Signed)
Went to do rounding pt gone to radiology 

## 2021-08-09 ENCOUNTER — Encounter: Payer: Self-pay | Admitting: Internal Medicine

## 2021-09-04 ENCOUNTER — Encounter: Payer: Self-pay | Admitting: Internal Medicine

## 2021-09-12 DIAGNOSIS — N390 Urinary tract infection, site not specified: Secondary | ICD-10-CM | POA: Insufficient documentation

## 2021-09-17 ENCOUNTER — Encounter: Payer: Self-pay | Admitting: *Deleted

## 2021-10-02 ENCOUNTER — Emergency Department (HOSPITAL_BASED_OUTPATIENT_CLINIC_OR_DEPARTMENT_OTHER)
Admission: EM | Admit: 2021-10-02 | Discharge: 2021-10-02 | Disposition: A | Discharge status: Home / Self Care | Payer: Medicare Other | Attending: Emergency Medicine | Admitting: Emergency Medicine

## 2021-10-02 ENCOUNTER — Other Ambulatory Visit: Payer: Self-pay

## 2021-10-02 ENCOUNTER — Emergency Department (HOSPITAL_BASED_OUTPATIENT_CLINIC_OR_DEPARTMENT_OTHER): Payer: Medicare Other

## 2021-10-02 ENCOUNTER — Encounter (HOSPITAL_BASED_OUTPATIENT_CLINIC_OR_DEPARTMENT_OTHER): Payer: Self-pay

## 2021-10-02 DIAGNOSIS — J069 Acute upper respiratory infection, unspecified: Secondary | ICD-10-CM | POA: Diagnosis not present

## 2021-10-02 DIAGNOSIS — F17210 Nicotine dependence, cigarettes, uncomplicated: Secondary | ICD-10-CM | POA: Insufficient documentation

## 2021-10-02 DIAGNOSIS — B9789 Other viral agents as the cause of diseases classified elsewhere: Secondary | ICD-10-CM | POA: Diagnosis not present

## 2021-10-02 DIAGNOSIS — Z853 Personal history of malignant neoplasm of breast: Secondary | ICD-10-CM | POA: Insufficient documentation

## 2021-10-02 DIAGNOSIS — Z96652 Presence of left artificial knee joint: Secondary | ICD-10-CM | POA: Insufficient documentation

## 2021-10-02 DIAGNOSIS — Z20822 Contact with and (suspected) exposure to covid-19: Secondary | ICD-10-CM | POA: Diagnosis not present

## 2021-10-02 DIAGNOSIS — R059 Cough, unspecified: Secondary | ICD-10-CM | POA: Diagnosis not present

## 2021-10-02 LAB — RESP PANEL BY RT-PCR (FLU A&B, COVID) ARPGX2
Influenza A by PCR: NEGATIVE
Influenza B by PCR: NEGATIVE
SARS Coronavirus 2 by RT PCR: NEGATIVE

## 2021-10-02 MED ORDER — PREDNISONE 50 MG PO TABS: 50.0000 mg | ORAL_TABLET | Freq: Every day | ORAL | 0 refills | Status: AC

## 2021-10-02 MED ORDER — FLUTICASONE PROPIONATE 50 MCG/ACT NA SUSP: 2.0000 | Freq: Every day | NASAL | 0 refills | Status: DC

## 2021-10-02 MED ORDER — BENZONATATE 100 MG PO CAPS: 100.0000 mg | ORAL_CAPSULE | Freq: Three times a day (TID) | ORAL | 0 refills | Status: DC

## 2021-10-02 NOTE — ED Triage Notes (Signed)
Cough congestion and chills onset yesterday. Fever 100.3.  Motrin and tylenol taken 90 minutes ago

## 2021-10-02 NOTE — ED Provider Notes (Signed)
[facility] EMERGENCY DEPT Provider Note   CSN: [id number] Arrival date & time: 10/02/21  1125    History Chief Complaint  Patient presents with   Cough    Sandra Conrad is a 65 y.o. female with no significant past medical history who presents for evaluation of cough, congestion and chills.  Began yesterday.  Temp max 100.3.  Took Tylenol and Motrin 90 minutes PTA.  Daughter in room had similar symptoms.  Family was assessed in the emergency department diagnosed with unspecified viral illness, daughter is improving.  Patient states she has had some congestion, rhinorrhea, cough productive of green sputum.  She denies any headache, sore throat, chest pain, shortness of breath, back pain.  She states she has chronic abdominal pain which remains unchanged.  No loose stools, urination difficulties, melena or BRBPR.  No lower extremity edema, recent surgery, immobilization.  Denies additional aggravating or alleviating factors.  States she has no pain.  History obtained from patient and past medical records.  No interpreter is used  HPI     Past Medical History:  Diagnosis Date   Abdominal pain    1 time in last 2 years ago/   Anxiety    Arthritis    Blood transfusion without reported diagnosis 2005   1 time   Cancer [facility]) 2011   Right breast IDC   Complication of anesthesia    Pt request scopolamine patch   Depression    Diarrhea    last time in 2013   Diverticulosis    Dysrhythmia    tachacardia managed by pcp    GERD (gastroesophageal reflux disease)    Insomnia    Lumbar herniated disc    L3-4 and L5   Lumbar nerve root impingement    right leg   Migraine    Neuropathy of foot    legs due to djd and spinal stenosis intermittent neuropathy   OA (osteoarthritis)    both hips and spine and left foot   Osteoporosis    Personal history of chemotherapy    oral/on meds for 4 years   Personal history of radiation therapy 2008   had 35 treatments/ last on  2008   PONV (postoperative nausea and vomiting)    extreme ponv uses scopolamine patch   Tubular adenoma of colon    Vitamin D deficiency    Wears glasses     Patient Active Problem List   Diagnosis Date Noted   AVN of femur ([place]) 03/30/2019   Major depressive disorder, recurrent severe without psychotic features ([place]) 05/02/2015   Suicidal ideation    Abdominal pain    Diarrhea     Past Surgical History:  Procedure Laterality Date   BACK SURGERY  08/21/2018   L4-L5 and laminectomy   BREAST EXCISIONAL BIOPSY Right 2008   BREAST LUMPECTOMY  2008   right breast   CARDIAC CATHETERIZATION  2012   no stents/no blockages   DILATION AND CURETTAGE OF UTERUS  1982   HIP SURGERY Left jan 2005 and oct 2005   left hip x 2 ( pt report that first hip failed, revision was needed)   TOTAL HIP ARTHROPLASTY Right 03/31/2019   Procedure: Right Anterior Total Hip Arthroplasty;  Surgeon: Frederik Pear, MD;  Location: [facility];  Service: Orthopedics;  Laterality: Right;   WRIST SURGERY  2007   right wrist orif plates and 12 screws still in     OB History   No obstetric history on file.  Family History  Problem Relation Age of Onset   Colon cancer Mother    Heart attack Father    Bipolar disorder Sister    Colon polyps Sister    Heart disease Brother    Colon polyps Brother    Transient ischemic attack Brother    Esophageal cancer Neg Hx    Stomach cancer Neg Hx    Rectal cancer Neg Hx     Social History   Tobacco Use   Smoking status: Every Day    Packs/day: 0.50    Years: 30.00    Pack years: 15.00    Types: Cigarettes    Last attempt to quit: 01/05/2021    Years since quitting: 0.7   Smokeless tobacco: Never   Tobacco comments:    smokes 10 cigarettes a day  Vaping Use   Vaping Use: Never used  Substance Use Topics   Alcohol use: No   Drug use: Not Currently    Home Medications Prior to Admission medications   Medication Sig Start Date End Date Taking?  Authorizing Provider  benzonatate (TESSALON) 100 MG capsule Take 1 capsule (100 mg total) by mouth every 8 (eight) hours. 10/02/21  Yes Chenille Toor A, PA-C  fluticasone (FLONASE) 50 MCG/ACT nasal spray Place 2 sprays into both nostrils daily. 10/02/21  Yes Lyndell Gillyard A, PA-C  predniSONE (DELTASONE) 50 MG tablet Take 1 tablet (50 mg total) by mouth daily for 5 days. 10/02/21 10/07/21 Yes Cheo Selvey A, PA-C  Cholecalciferol 1.25 MG (50000 UT) capsule Take 50,000 Units by mouth daily. Sunday    [provider]  clonazePAM (KLONOPIN) 1 MG tablet Take 1 mg by mouth 2 (two) times daily.    [provider]  dicyclomine (BENTYL) 20 MG tablet Take 1 tablet (20 mg total) by mouth 2 (two) times daily as needed for spasms. 08/07/21   Marcello Fennel, PA-C  escitalopram (LEXAPRO) 10 MG tablet Take 10 mg by mouth daily.    [provider]  gabapentin (NEURONTIN) 300 MG capsule Take 600 mg by mouth 3 (three) times daily. Patient not taking: Reported on 08/07/2021    [provider]  gabapentin (NEURONTIN) 600 MG tablet Take 600 mg by mouth 3 (three) times daily. 07/18/21   [provider]  HYDROcodone-acetaminophen (NORCO) 7.5-325 MG tablet Take 1 tablet by mouth in the morning, at noon, and at bedtime. Patient not taking: Reported on 08/07/2021    [provider]  lamoTRIgine (LAMICTAL) 150 MG tablet Take 150 mg by mouth daily.    [provider]  Loperamide HCl (IMODIUM PO) Take by mouth as needed.    [provider]  omeprazole (PRILOSEC) 20 MG capsule Take 20 mg by mouth as needed.    [provider]  ondansetron (ZOFRAN) 4 MG tablet Take 1 tablet (4 mg total) by mouth every 8 (eight) hours as needed for nausea or vomiting. 08/07/21   Marcello Fennel, PA-C  OVER THE COUNTER MEDICATION Vitamin d 1000 units daily    [provider]  propranolol (INDERAL) 20 MG tablet Take 20 mg by mouth as needed. For  tachacardia    [provider]  rosuvastatin (CRESTOR) 20 MG tablet Take 40 mg by mouth every evening.    [provider]  traMADol (ULTRAM) 50 MG tablet Take 50 mg by mouth every 6 (six) hours as needed. 07/13/21   [provider]  UNABLE TO FIND Vitamin d 50000 units q week saturday Patient not  taking: Reported on 08/07/2021    [provider]    Allergies    Flagyl [metronidazole] and Cefdinir  Review of Systems   Review of Systems  Constitutional:  Positive for activity change, appetite change, chills, fatigue and fever.  HENT:  Positive for congestion, postnasal drip and rhinorrhea. Negative for sore throat, trouble swallowing and voice change.   Respiratory:  Positive for cough. Negative for apnea, choking, chest tightness, shortness of breath, wheezing and stridor.   Cardiovascular: Negative.   Gastrointestinal:  Positive for abdominal pain (chronic, unchanged from baseline). Negative for anal bleeding, blood in stool, constipation, diarrhea, nausea, rectal pain and vomiting.  Genitourinary: Negative.   Musculoskeletal: Negative.   Skin: Negative.   Neurological:  Positive for weakness (generalized). Negative for dizziness, tremors, seizures, syncope, facial asymmetry, speech difficulty, light-headedness, numbness and headaches.  All other systems reviewed and are negative.  Physical Exam Updated Vital Signs BP 132/63 (BP Location: Right Arm)    Pulse 65    Temp 97.6 F (36.4 C) (Oral)    Resp 16    Ht 5' 5.5" (1.664 m)    Wt 84.4 kg    SpO2 100%    BMI 30.48 kg/m   Physical Exam Vitals and nursing note reviewed.  Constitutional:      General: She is not in acute distress.    Appearance: She is not ill-appearing, toxic-appearing or diaphoretic.  HENT:     Head: Normocephalic and atraumatic.     Jaw: There is normal jaw occlusion.     Right Ear: No hemotympanum. Tympanic membrane is not injected, scarred, perforated, erythematous, retracted  or bulging.     Left Ear: No hemotympanum. Tympanic membrane is not injected, scarred, perforated, erythematous, retracted or bulging.     Ears:     Comments: No Mastoid tenderness.    Nose: Congestion and rhinorrhea present.     Comments: Clear rhinorrhea and congestion to bilateral nares.  No sinus tenderness.    Mouth/Throat:     Comments: Posterior oropharynx clear.  Mucous membranes moist.  Tonsils without erythema or exudate.  Uvula midline without deviation.  No evidence of PTA or RPA.  No drooling, dysphasia or trismus.  Phonation normal. Neck:     Trachea: Trachea and phonation normal.     Meningeal: Brudzinski's sign and Kernig's sign absent.     Comments: No Neck stiffness or neck rigidity.  No meningismus.  No cervical lymphadenopathy. Cardiovascular:     Pulses: Normal pulses.     Heart sounds: Normal heart sounds.     Comments: No murmurs rubs or gallops. Pulmonary:     Effort: Pulmonary effort is normal. No respiratory distress.     Breath sounds: Normal breath sounds. No stridor. No wheezing, rhonchi or rales.     Comments: Clear to auscultation bilaterally without wheeze, rhonchi or rales.  No accessory muscle usage.  Able speak in full sentences. Chest:     Chest wall: No tenderness.  Abdominal:     General: Bowel sounds are normal. There is no distension.     Palpations: Abdomen is soft. There is no mass.     Tenderness: There is no abdominal tenderness. There is no guarding or rebound.     Comments: Soft, nontender without rebound or guarding.  No CVA tenderness.  Musculoskeletal:        General: No swelling, tenderness, deformity or signs of injury. Normal range of motion.     Comments: Moves all 4 extremities without  difficulty.  Lower extremities without edema, erythema or warmth.  Skin:    General: Skin is warm.     Capillary Refill: Capillary refill takes less than 2 seconds.     Comments: Brisk capillary refill.  No rashes or lesions.  Neurological:      General: No focal deficit present.     Mental Status: She is alert and oriented to person, place, and time.     Comments: Ambulatory in department without difficulty.  Cranial nerves II through XII grossly intact.  No facial droop.  No aphasia.    ED Results / Procedures / Treatments   Labs (all labs ordered are listed, but only abnormal results are displayed) Labs Reviewed  RESP PANEL BY RT-PCR (FLU A&B, COVID) [id number]    EKG None  Radiology DG Chest Portable 1 View  Result Date: 10/02/2021 CLINICAL DATA:  Cough and congestion for 2 days. EXAM: PORTABLE CHEST 1 VIEW COMPARISON:  Chest x-ray dated March 30, 2019. FINDINGS: The heart size and mediastinal contours are within normal limits. Normal pulmonary vascularity. New coarsened interstitial markings, likely smoking-related. No focal consolidation, pleural effusion, or pneumothorax. No acute osseous abnormality. IMPRESSION: 1. No active disease. Electronically Signed   By: Titus Dubin M.D.   On: 10/02/2021 13:01    Procedures Procedures   Medications Ordered in ED Medications - No data to display  ED Course  I have reviewed the triage vital signs and the nursing notes.  Pertinent labs & imaging results that were available during my care of the patient were reviewed by me and considered in my medical decision making (see chart for details).  Here for evaluation of cough, congestion and rhinorrhea over the last 2 days.  Daughter had similar symptoms, diagnosed with viral URI at that time.  She is afebrile here, nonseptic, not ill-appearing.  Has a nonfocal neuro exam without deficits.  Posterior oropharynx clear.  Heart and lungs clear.  Abdomen soft, nontender.  No obvious VTE on exam.  She appears well-hydrated.  Does state occasional wheeze however no wheeze here.  COVID, flu neg Chest x-ray negative for acute infectious process, cardiomegaly, pulmonary edema  Suspect viral URI/bronchitis.  DC home symptomatic management,  close outpatient follow-up, return for new or worsening symptoms  At this time I have low suspicion for acute bacterial infectious process, ACS, PE, dissection, pneumothorax, fluid overload as cause of her symptoms.  The patient has been appropriately medically screened and/or stabilized in the ED. I have low suspicion for any other emergent medical condition which would require further screening, evaluation or treatment in the ED or require inpatient management.  Patient is hemodynamically stable and in no acute distress.  Patient able to ambulate in department prior to ED.  Evaluation does not show acute pathology that would require ongoing or additional emergent interventions while in the emergency department or further inpatient treatment.  I have discussed the diagnosis with the patient and answered all questions.  Pain is been managed while in the emergency department and patient has no further complaints prior to discharge.  Patient is comfortable with plan discussed in room and is stable for discharge at this time.  I have discussed strict return precautions for returning to the emergency department.  Patient was encouraged to follow-up with PCP/specialist refer to at discharge.     MDM Rules/Calculators/A&P  Final Clinical Impression(s) / ED Diagnoses Final diagnoses:  Viral URI with cough    Rx / DC Orders ED Discharge Orders          Ordered    predniSONE (DELTASONE) 50 MG tablet  Daily        10/02/21 1348    benzonatate (TESSALON) 100 MG capsule  Every 8 hours        10/02/21 1348    fluticasone (FLONASE) 50 MCG/ACT nasal spray  Daily        10/02/21 1348             Deltha Bernales A, PA-C 00/18/09 [id number]    Lianne Cure, DO 92/52/41 [id number]

## 2021-10-02 NOTE — Discharge Instructions (Addendum)
Chest x-ray did not show any evidence of pneumonia  COVID, flu were negative  This is likely a viral infection, I have written you for a few medications to help with your symptoms  Follow-up with primary care provider in a few days if symptoms do not improve  Return for new or worsening symptoms

## 2021-10-09 ENCOUNTER — Ambulatory Visit: Payer: Medicare Other | Admitting: Internal Medicine

## 2021-10-09 ENCOUNTER — Other Ambulatory Visit (INDEPENDENT_AMBULATORY_CARE_PROVIDER_SITE_OTHER): Payer: Medicare Other

## 2021-10-09 ENCOUNTER — Encounter: Payer: Self-pay | Admitting: Internal Medicine

## 2021-10-09 VITALS — BP 110/64 | HR 70 | Ht 66.0 in | Wt 190.8 lb

## 2021-10-09 DIAGNOSIS — R1084 Generalized abdominal pain: Secondary | ICD-10-CM | POA: Diagnosis not present

## 2021-10-09 DIAGNOSIS — Z86010 Personal history of colonic polyps: Secondary | ICD-10-CM | POA: Diagnosis not present

## 2021-10-09 DIAGNOSIS — R109 Unspecified abdominal pain: Secondary | ICD-10-CM

## 2021-10-09 DIAGNOSIS — Z800 Family history of malignant neoplasm of digestive organs: Secondary | ICD-10-CM | POA: Diagnosis not present

## 2021-10-09 DIAGNOSIS — R140 Abdominal distension (gaseous): Secondary | ICD-10-CM | POA: Diagnosis not present

## 2021-10-09 DIAGNOSIS — K580 Irritable bowel syndrome with diarrhea: Secondary | ICD-10-CM | POA: Diagnosis not present

## 2021-10-09 LAB — TSH: TSH: 0.71 u[IU]/mL (ref 0.35–5.50)

## 2021-10-09 MED ORDER — SUTAB 1479-225-188 MG PO TABS: ORAL_TABLET | ORAL | 0 refills | Status: DC

## 2021-10-09 MED ORDER — RIFAXIMIN 550 MG PO TABS: 550.0000 mg | ORAL_TABLET | Freq: Three times a day (TID) | ORAL | 0 refills | Status: DC

## 2021-10-09 MED ORDER — DICYCLOMINE HCL 20 MG PO TABS
20.0000 mg | ORAL_TABLET | Freq: Three times a day (TID) | ORAL | 1 refills | Status: AC | PRN
Start: 2021-10-09 — End: ?

## 2021-10-09 NOTE — Progress Notes (Signed)
Patient ID: Sandra Conrad, female   DOB: 1956/08/27, 66 y.o.   MRN: [id number] HPI: Sandra Conrad is a 66 year old female with a history of adenomatous colon polyps, family history of colon cancer in her mother, GERD, diverticulosis with history of diverticulitis, history of breast cancer, osteoarthritis who is seen to evaluate abdominal pain, abdominal bloating, loose stools.  She is here alone today.  She is known to me from her colonoscopic surveillance.  Her last colonoscopy was on 08/06/2018.  This revealed 5 polyps which were all smaller than 5 mm.  4 of these were adenomatous, 1 was hyperplastic polyp.  There was diverticulosis in the left colon.  She reports that she has been struggling with crampy abdominal pain, significant abdominal gas and bloating as well as alternating but predominantly loose stools with urgency.  She has had several accidents.  She has not had blood in stool or melena.  This became so severe in early November 2022 that she was seen in the emergency department.  There lab work was done as well as a CT scan of the abdomen and pelvis.  This scan was largely unremarkable, see below.  She was given a prescription for Bentyl which she states "really helped" as well as Zofran.  She has not needed Zofran and has not had nausea or vomiting.  She reports her abdominal pain is worse after eating with the significant bloating.  Pain radiates throughout the abdomen and even in to her upper back and under her rib cage.  She is taking omeprazole 20 mg daily which controls her heartburn.  No dysphagia or odynophagia.  Her abdominal bloating and loose stools seem to be worse depending on diet.  Salads, raw fruits and vegetables make her symptoms worse.  Therefore she is predominantly eating protein and carbohydrates which has led to some weight gain.  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  She does smoke cigarettes.  No alcohol.  Her mother had colon cancer but did not die of this disease.  She did have an ileostomy.  Past Medical History:  Diagnosis Date   Abdominal pain    1 time in last 2 years ago/   Anxiety    Arthritis    Blood transfusion without reported diagnosis 2005   1 time   Cancer [facility]) 2011   Right breast IDC   Complication of anesthesia    Pt request scopolamine patch   Depression    Diarrhea    last time in 2013   Diverticulosis    Dysrhythmia    tachacardia managed by pcp    GERD (gastroesophageal reflux disease)    Insomnia    Lumbar herniated disc    L3-4 and L5   Lumbar nerve root impingement    right leg   Migraine    Neuropathy of foot    legs due to djd and spinal stenosis intermittent neuropathy   OA (osteoarthritis)    both hips and spine and left foot   Osteoporosis    Personal history of chemotherapy    oral/on meds for 4 years   Personal history of radiation therapy 2008   had 35 treatments/ last on 2008   PONV (postoperative nausea and vomiting)    extreme ponv uses scopolamine patch   Tubular adenoma of colon    Vitamin D deficiency    Wears glasses  Past Surgical History:  Procedure Laterality Date   BACK SURGERY  08/21/2018   L4-L5 and laminectomy   BREAST EXCISIONAL BIOPSY Right 2008   BREAST LUMPECTOMY  2008   right breast   CARDIAC CATHETERIZATION  2012   no stents/no blockages   DILATION AND CURETTAGE OF UTERUS  1982   HIP SURGERY Left jan 2005 and oct 2005   left hip x 2 ( pt report that first hip failed, revision was needed)   TOTAL HIP ARTHROPLASTY Right 03/31/2019   Procedure: Right Anterior Total Hip Arthroplasty;  Surgeon: Frederik Pear, MD;  Location: [facility];  Service: Orthopedics;  Laterality: Right;   WRIST SURGERY  2007   right wrist orif plates and 12 screws still in    Outpatient Medications Prior to Visit  Medication Sig Dispense Refill   Cholecalciferol 1.25 MG (50000 UT)  capsule Take 50,000 Units by mouth daily. Sunday     clonazePAM (KLONOPIN) 1 MG tablet Take 1 mg by mouth 2 (two) times daily.     escitalopram (LEXAPRO) 10 MG tablet Take 10 mg by mouth daily.     gabapentin (NEURONTIN) 600 MG tablet Take 600 mg by mouth 3 (three) times daily.     lamoTRIgine (LAMICTAL) 150 MG tablet Take 150 mg by mouth daily.     omeprazole (PRILOSEC) 20 MG capsule Take 20 mg by mouth as needed.     OVER THE COUNTER MEDICATION Vitamin d 1000 units daily     propranolol (INDERAL) 20 MG tablet Take 20 mg by mouth as needed. For tachacardia     rosuvastatin (CRESTOR) 20 MG tablet Take 40 mg by mouth every evening.     traMADol (ULTRAM) 50 MG tablet Take 50 mg by mouth every 6 (six) hours as needed.     UNABLE TO FIND Vitamin d 50000 units q week saturday     benzonatate (TESSALON) 100 MG capsule Take 1 capsule (100 mg total) by mouth every 8 (eight) hours. 21 capsule 0   dicyclomine (BENTYL) 20 MG tablet Take 1 tablet (20 mg total) by mouth 2 (two) times daily as needed for spasms. 20 tablet 0   fluticasone (FLONASE) 50 MCG/ACT nasal spray Place 2 sprays into both nostrils daily. 11.1 mL 0   gabapentin (NEURONTIN) 300 MG capsule Take 600 mg by mouth 3 (three) times daily. (Patient not taking: Reported on 08/07/2021)     HYDROcodone-acetaminophen (NORCO) 7.5-325 MG tablet Take 1 tablet by mouth in the morning, at noon, and at bedtime. (Patient not taking: Reported on 08/07/2021)     Loperamide HCl (IMODIUM PO) Take by mouth as needed.     ondansetron (ZOFRAN) 4 MG tablet Take 1 tablet (4 mg total) by mouth every 8 (eight) hours as needed for nausea or vomiting. 12 tablet 0   No facility-administered medications prior to visit.    Allergies  Allergen Reactions   Flagyl [Metronidazole] Nausea And Vomiting   Cefdinir Nausea Only and Other (See Comments)    Abdominal pain     Family History  Problem Relation Age of Onset   Colon cancer Mother    Heart attack Father     Bipolar disorder Sister    Colon polyps Sister    Heart disease Brother    Colon polyps Brother    Transient ischemic attack Brother    Esophageal cancer Neg Hx    Stomach cancer Neg Hx    Rectal cancer Neg Hx     Social History  Tobacco Use   Smoking status: Every Day    Packs/day: 0.50    Years: 30.00    Pack years: 15.00    Types: Cigarettes    Last attempt to quit: 01/05/2021    Years since quitting: 0.7   Smokeless tobacco: Never   Tobacco comments:    smokes 10 cigarettes a day  Vaping Use   Vaping Use: Never used  Substance Use Topics   Alcohol use: No   Drug use: Not Currently    ROS: As per history of present illness, otherwise negative  BP 110/64    Pulse 70    Ht 5\' 6"  (1.676 m)    Wt 190 lb 12.8 oz (86.5 kg)    BMI 30.80 kg/m  Gen: awake, alert, NAD HEENT: anicteric CV: RRR, no mrg Pulm: CTA b/l Abd: soft, obese, mildly distended and with mild increased tympany, overall nontender, +BS throughout Ext: no c/c/e Neuro: nonfocal   RELEVANT LABS AND IMAGING: CBC    Component Value Date/Time   WBC 6.7 08/07/2021 1412   RBC 4.19 08/07/2021 1412   HGB 13.9 08/07/2021 1412   HCT 42.8 08/07/2021 1412   PLT 340 08/07/2021 1412   MCV 102.1 (H) 08/07/2021 1412   MCH 33.2 08/07/2021 1412   MCHC 32.5 08/07/2021 1412   RDW 13.5 08/07/2021 1412   LYMPHSABS 2.1 03/30/2019 1220   MONOABS 0.6 03/30/2019 1220   EOSABS 0.1 03/30/2019 1220   BASOSABS 0.0 03/30/2019 1220    CMP     Component Value Date/Time   NA 138 08/07/2021 1412   K 3.9 08/07/2021 1412   CL 105 08/07/2021 1412   CO2 27 08/07/2021 1412   GLUCOSE 97 08/07/2021 1412   BUN 13 08/07/2021 1412   CREATININE 0.63 08/07/2021 1412   CALCIUM 8.8 (L) 08/07/2021 1412   PROT 6.7 08/07/2021 1412   ALBUMIN 3.8 08/07/2021 1412   AST 18 08/07/2021 1412   ALT 28 08/07/2021 1412   ALKPHOS 64 08/07/2021 1412   BILITOT 0.8 08/07/2021 1412   GFRNONAA >60 08/07/2021 1412   GFRAA >60 05/15/2019 0849  CT  ABDOMEN AND PELVIS WITH CONTRAST   TECHNIQUE: Multidetector CT imaging of the abdomen and pelvis was performed using the standard protocol following bolus administration of intravenous contrast.   CONTRAST:  147mL OMNIPAQUE IOHEXOL 300 MG/ML  SOLN   COMPARISON:  CT abdomen pelvis dated 12/01/2015.   FINDINGS: Lower chest: The visualized lung bases are clear. There is coronary vascular calcification.   No intra-abdominal free air or free fluid.   Hepatobiliary: No focal liver abnormality is seen. No gallstones, gallbladder wall thickening, or biliary dilatation.   Pancreas: Unremarkable. No pancreatic ductal dilatation or surrounding inflammatory changes.   Spleen: Small scattered calcified splenic granuloma.   Adrenals/Urinary Tract: The adrenal glands are unremarkable. The kidneys, visualized ureters, and the urinary bladder are unremarkable.   Stomach/Bowel: There is sigmoid diverticulosis without active inflammatory changes. There is no bowel obstruction or active inflammation. The appendix is normal.   Vascular/Lymphatic: Moderate aortoiliac atherosclerotic disease. The IVC is unremarkable. No portal venous gas. There is no adenopathy.   Reproductive: The uterus and ovaries are grossly unremarkable. No pelvic mass.   Other: None   Musculoskeletal: Degenerative changes of the spine. Bilateral hip arthroplasties. No acute osseous pathology.   IMPRESSION: 1. No acute intra-abdominal or pelvic pathology. 2. Sigmoid diverticulosis. 3. Aortic Atherosclerosis (ICD10-I70.0).     Electronically Signed   By: Laren Everts.D.  On: 08/07/2021 18:25   ASSESSMENT/PLAN: 66 year old female with a history of adenomatous colon polyps, family history of colon cancer in her mother, GERD, diverticulosis with history of diverticulitis, history of breast cancer, osteoarthritis who is seen to evaluate abdominal pain, abdominal bloating, loose stools.  Abdominal  pain/bloating/alternating bowel habits/loose stools --symptoms sound suspicious for SIBO but could certainly also be irritable bowel in nature.  We discussed this at length today.  I recommended the following: --Rifaximin 550 mg 3 times daily x14 days for IBS-D, with knowledge this also would treat bacterial overgrowth --Celiac panel, TSH --If symptoms unimproved then proceed with upper endoscopy --Bentyl 20 mg 3 times daily as needed  2.  History of adenomatous colon polyps and family history of colon cancer --surveillance colonoscopy due at this time.  We reviewed the risks, benefits and alternatives to both upper and lower endoscopy and she is agreeable and wishes to proceed.  We will schedule this in February to allow time for treatment as above --Colonoscopy in the [facility] for surveillance of adenomatous polyps    [place]:PXTG, Edwinna Areola, Md [address] [place],  [place] [id number]

## 2021-10-09 NOTE — Patient Instructions (Signed)
You have been scheduled for an endoscopy and colonoscopy. Please follow the written instructions given to you at your visit today. Please pick up your prep supplies at the pharmacy within the next 1-3 days. If you use inhalers (even only as needed), please bring them with you on the day of your procedure.  Your provider has requested that you go to the basement level for lab work before leaving today. Press "B" on the elevator. The lab is located at the first door on the left as you exit the elevator.  We have sent the following medications to your pharmacy for you to pick up at your convenience: Xifaxan 550 mg three times daily Bentyl 20 mg three times daily as needed   If you are age 68 or older, your body mass index should be between 23-30. Your Body mass index is 30.8 kg/m. If this is out of the aforementioned range listed, please consider follow up with your Primary Care Provider.  If you are age 85 or younger, your body mass index should be between 19-25. Your Body mass index is 30.8 kg/m. If this is out of the aformentioned range listed, please consider follow up with your Primary Care Provider.   ________________________________________________________  The [facility] providers would like to encourage you to use [facility] to communicate with providers for non-urgent requests or questions.  Due to long hold times on the telephone, sending your provider a message by [facility] may be a faster and more efficient way to get a response.  Please allow 48 business hours for a response.  Please remember that this is for non-urgent requests.  _______________________________________________________  Due to recent changes in healthcare laws, you may see the results of your imaging and laboratory studies on MyChart before your provider has had a chance to review them.  We understand that in some cases there may be results that are confusing or concerning to you. Not all laboratory results come back in  the same time frame and the provider may be waiting for multiple results in order to interpret others.  Please give [place] 48 hours in order for your provider to thoroughly review all the results before contacting the office for clarification of your results.

## 2021-10-11 LAB — IGA: Immunoglobulin A: 196 mg/dL (ref 70–320)

## 2021-10-11 LAB — TISSUE TRANSGLUTAMINASE, IGA: (tTG) Ab, IgA: <1 U/mL

## 2021-10-15 ENCOUNTER — Telehealth: Payer: Self-pay

## 2021-10-15 NOTE — Telephone Encounter (Signed)
Xifaxan approved 10/12/2021 through 1/20/203

## 2021-10-16 NOTE — Telephone Encounter (Signed)
Although patient's Sandra Conrad has been approved, she still has a high copay [phone]). Please advise.Marland KitchenMarland Kitchen

## 2021-10-17 ENCOUNTER — Telehealth: Payer: Self-pay | Admitting: Internal Medicine

## 2021-10-17 NOTE — Telephone Encounter (Signed)
Patient called stating that Xifaxan was sent to [facility]. They contacted patient and stated that the co-pay would be 661 dollar a month. Seeking advice if there is any other medication that he can proscribe. Please advise.

## 2021-10-17 NOTE — Telephone Encounter (Signed)
Please advise on how to proceed.

## 2021-10-17 NOTE — Telephone Encounter (Signed)
We can try ciprofloxacin 500 mg twice daily x10 days -- SIBO  This is adequate; rifaximin would have been ideal; please have her update me as to her abdominal bloating and other GI symptoms as recently discussed in clinic about 2 weeks after completing antibiotic therapy  Thank you

## 2021-10-18 MED ORDER — CIPROFLOXACIN HCL 500 MG PO TABS: 500.0000 mg | ORAL_TABLET | Freq: Two times a day (BID) | ORAL | 0 refills | Status: AC

## 2021-10-18 NOTE — Telephone Encounter (Signed)
See other documented patient call.

## 2021-10-18 NOTE — Telephone Encounter (Signed)
Course of Cipro 500 mg twice daily for 10 days has been sent to patient's pharmacy. I have contacted the patient to let her know of medication change. She is aware to contact the office in 2 weeks to give an update. She agrees to call the office along with keeping appointment made for 11/27/2021.

## 2021-10-31 DIAGNOSIS — M5126 Other intervertebral disc displacement, lumbar region: Secondary | ICD-10-CM | POA: Diagnosis not present

## 2021-10-31 DIAGNOSIS — M5416 Radiculopathy, lumbar region: Secondary | ICD-10-CM | POA: Diagnosis not present

## 2021-11-17 DIAGNOSIS — J208 Acute bronchitis due to other specified organisms: Secondary | ICD-10-CM | POA: Diagnosis not present

## 2021-11-19 ENCOUNTER — Telehealth: Payer: Self-pay | Admitting: Internal Medicine

## 2021-11-19 NOTE — Telephone Encounter (Signed)
Noted, pt reports abd bloating responded favorably to rifaximin

## 2021-11-19 NOTE — Telephone Encounter (Signed)
Patient called to report that the medication Dr. Hilarie Fredrickson prescribed her helped her out.  She was advised if it did help that she could cancel the EGD portion of her upcoming procedure.  Procedure for 11/27/21 changed to colonoscopy only.

## 2021-11-20 ENCOUNTER — Encounter: Payer: Self-pay | Admitting: Internal Medicine

## 2021-11-27 ENCOUNTER — Other Ambulatory Visit: Payer: Self-pay

## 2021-11-27 ENCOUNTER — Encounter: Payer: Self-pay | Admitting: Internal Medicine

## 2021-11-27 ENCOUNTER — Ambulatory Visit (AMBULATORY_SURGERY_CENTER): Payer: Medicare Other | Admitting: Internal Medicine

## 2021-11-27 VITALS — BP 115/56 | HR 71 | Temp 97.7°F | Resp 14 | Ht 66.0 in | Wt 190.0 lb

## 2021-11-27 DIAGNOSIS — Z86010 Personal history of colonic polyps: Secondary | ICD-10-CM | POA: Diagnosis not present

## 2021-11-27 DIAGNOSIS — D123 Benign neoplasm of transverse colon: Secondary | ICD-10-CM

## 2021-11-27 DIAGNOSIS — D124 Benign neoplasm of descending colon: Secondary | ICD-10-CM | POA: Diagnosis not present

## 2021-11-27 DIAGNOSIS — Z800 Family history of malignant neoplasm of digestive organs: Secondary | ICD-10-CM | POA: Diagnosis not present

## 2021-11-27 MED ORDER — SODIUM CHLORIDE 0.9 % IV SOLN: 500.0000 mL | Freq: Once | INTRAVENOUS | Status: DC

## 2021-11-27 NOTE — Op Note (Signed)
[facility] Patient Name: Sandra Conrad Procedure Date: 11/27/2021 10:58 AM MRN: [id number] Endoscopist: Jerene Bears , MD Age: 66 Referring MD:  Date of Birth: Jan 12, 1956 Gender: Female Account #: [id number] Procedure:                Colonoscopy Indications:              High risk colon cancer surveillance: Personal                            history of multiple adenomas over time, Last                            colonoscopy: October 2019 (4 adenomas removed); FH                            colon cancer Medicines:                Monitored Anesthesia Care Procedure:                Pre-Anesthesia Assessment:                           - Prior to the procedure, a History and Physical                            was performed, and patient medications and                            allergies were reviewed. The patient's tolerance of                            previous anesthesia was also reviewed. The risks                            and benefits of the procedure and the sedation                            options and risks were discussed with the patient.                            All questions were answered, and informed consent                            was obtained. Prior Anticoagulants: The patient has                            taken no previous anticoagulant or antiplatelet                            agents. ASA Grade Assessment: II - A patient with                            mild systemic disease. After reviewing the risks  and benefits, the patient was deemed in                            satisfactory condition to undergo the procedure.                           After obtaining informed consent, the colonoscope                            was passed under direct vision. Throughout the                            procedure, the patient's blood pressure, pulse, and                            oxygen saturations were monitored continuously. The                             Olympus PCF-H190DL [id number]) Colonoscope was                            introduced through the anus and advanced to the                            cecum, identified by appendiceal orifice and                            ileocecal valve. The colonoscopy was performed                            without difficulty. The patient tolerated the                            procedure well. The quality of the bowel                            preparation was good. The ileocecal valve,                            appendiceal orifice, and rectum were photographed. Scope In: 11:15:15 AM Scope Out: 11:31:23 AM Scope Withdrawal Time: 0 hours 12 minutes 14 seconds  Total Procedure Duration: 0 hours 16 minutes 8 seconds  Findings:                 The digital rectal exam was normal.                           Two sessile polyps were found in the transverse                            colon. The polyps were 4 to 5 mm in size. These                            polyps were removed with a cold snare. Resection  and retrieval were complete.                           A 5 mm polyp was found in the descending colon. The                            polyp was sessile. The polyp was removed with a                            cold snare. Resection and retrieval were complete.                           Multiple small and large-mouthed diverticula were                            found in the sigmoid colon, descending colon and                            hepatic flexure.                           The retroflexed view of the distal rectum and anal                            verge was normal and showed no anal or rectal                            abnormalities. Complications:            No immediate complications. Estimated Blood Loss:     Estimated blood loss was minimal. Impression:               - Two 4 to 5 mm polyps in the transverse colon,                            removed with  a cold snare. Resected and retrieved.                           - One 5 mm polyp in the descending colon, removed                            with a cold snare. Resected and retrieved.                           - Diverticulosis in the sigmoid colon, in the                            descending colon and at the hepatic flexure.                           - The distal rectum and anal verge are normal on                            retroflexion view. Recommendation:           -  Patient has a contact number available for                            emergencies. The signs and symptoms of potential                            delayed complications were discussed with the                            patient. Return to normal activities tomorrow.                            Written discharge instructions were provided to the                            patient.                           - Resume previous diet.                           - Continue present medications.                           - Await pathology results.                           - Repeat colonoscopy is recommended for                            surveillance. The colonoscopy date will be                            determined after pathology results from today's                            exam become available for review. Jerene Bears, MD 11/27/2021 11:34:01 AM This report has been signed electronically.

## 2021-11-27 NOTE — Progress Notes (Signed)
Called to room to assist during endoscopic procedure.  Patient ID and intended procedure confirmed with present staff. Received instructions for my participation in the procedure from the performing physician.  

## 2021-11-27 NOTE — Progress Notes (Signed)
A and O x3. Report to RN. Tolerated MAC anesthesia well.

## 2021-11-27 NOTE — Progress Notes (Signed)
AG - Vs

## 2021-11-27 NOTE — Patient Instructions (Signed)
Handout on polyps and diverticulosis given    YOU HAD AN ENDOSCOPIC PROCEDURE TODAY AT THE [facility]:   Refer to the procedure report that was given to you for any specific questions about what was found during the examination.  If the procedure report does not answer your questions, please call your gastroenterologist to clarify.  If you requested that your care partner not be given the details of your procedure findings, then the procedure report has been included in a sealed envelope for you to review at your convenience later.  YOU SHOULD EXPECT: Some feelings of bloating in the abdomen. Passage of more gas than usual.  Walking can help get rid of the air that was put into your GI tract during the procedure and reduce the bloating. If you had a lower endoscopy (such as a colonoscopy or flexible sigmoidoscopy) you may notice spotting of blood in your stool or on the toilet paper. If you underwent a bowel prep for your procedure, you may not have a normal bowel movement for a few days.  Please Note:  You might notice some irritation and congestion in your nose or some drainage.  This is from the oxygen used during your procedure.  There is no need for concern and it should clear up in a day or so.  SYMPTOMS TO REPORT IMMEDIATELY:   Following lower endoscopy (colonoscopy or flexible sigmoidoscopy):  Excessive amounts of blood in the stool  Significant tenderness or worsening of abdominal pains  Swelling of the abdomen that is new, acute  Fever of 100F or higher   For urgent or emergent issues, a gastroenterologist can be reached at any hour by calling [phone]. Do not use MyChart messaging for urgent concerns.    DIET:  We do recommend a small meal at first, but then you may proceed to your regular diet.  Drink plenty of fluids but you should avoid alcoholic beverages for 24 hours.  ACTIVITY:  You should plan to take it easy for the rest of today and you should NOT  DRIVE or use heavy machinery until tomorrow (because of the sedation medicines used during the test).    FOLLOW UP: Our staff will call the number listed on your records 48-72 hours following your procedure to check on you and address any questions or concerns that you may have regarding the information given to you following your procedure. If we do not reach you, we will leave a message.  We will attempt to reach you two times.  During this call, we will ask if you have developed any symptoms of COVID 19. If you develop any symptoms (ie: fever, flu-like symptoms, shortness of breath, cough etc.) before then, please call [phone].  If you test positive for Covid 19 in the 2 weeks post procedure, please call and report this information to us.    If any biopsies were taken you will be contacted by phone or by letter within the next 1-3 weeks.  Please call us at [phone] if you have not heard about the biopsies in 3 weeks.    SIGNATURES/CONFIDENTIALITY: You and/or your care partner have signed paperwork which will be entered into your electronic medical record.  These signatures attest to the fact that that the information above on your After Visit Summary has been reviewed and is understood.  Full responsibility of the confidentiality of this discharge information lies with you and/or your care-partner. 

## 2021-11-27 NOTE — Progress Notes (Signed)
° °GASTROENTEROLOGY PROCEDURE H&P NOTE  ° °Primary Care Physician: °Hall, John Z, MD ° ° ° °Reason for Procedure:   Hx of colon polyps/fam hx colon cancer ° °Plan:    colonoscopy ° °Patient is appropriate for endoscopic procedure(s) in the ambulatory ([facility]) setting. ° °The nature of the procedure, as well as the risks, benefits, and alternatives were carefully and thoroughly reviewed with the patient. Ample time for discussion and questions allowed. The patient understood, was satisfied, and agreed to proceed.  ° ° ° °HPI: °Sandra Conrad is a 66 y.o. female who presents for colonoscopy.  Medical history as below.  Tolerated the prep.  No recent chest pain or shortness of breath.  No abdominal pain today. ° °Past Medical History:  °Diagnosis Date  ° Abdominal pain   ° 1 time in last 2 years ago/  ° Anxiety   ° Arthritis   ° Blood transfusion without reported diagnosis 2005  ° 1 time  ° Cancer (HCC) 2011  ° Right breast IDC  ° Complication of anesthesia   ° Pt request scopolamine patch  ° Depression   ° Diarrhea   ° last time in 2013  ° Diverticulosis   ° Dysrhythmia   ° tachacardia managed by pcp   ° GERD (gastroesophageal reflux disease)   ° Hyperlipidemia   ° Insomnia   ° Lumbar herniated disc   ° L3-4 and L5  ° Lumbar nerve root impingement   ° right leg  ° Migraine   ° Neuropathy of foot   ° legs due to djd and spinal stenosis intermittent neuropathy  ° OA (osteoarthritis)   ° both hips and spine and left foot  ° Osteoporosis   ° Personal history of chemotherapy   ° oral/on meds for 4 years  ° Personal history of radiation therapy 2008  ° had 35 treatments/ last on 2008  ° PONV (postoperative nausea and vomiting)   ° extreme ponv uses scopolamine patch  ° Tubular adenoma of colon   ° Vitamin D deficiency   ° Wears glasses   ° ° °Past Surgical History:  °Procedure Laterality Date  ° BACK SURGERY  08/21/2018  ° L4-L5 and laminectomy  ° BREAST EXCISIONAL BIOPSY Right 2008  ° BREAST LUMPECTOMY  2008  ° right breast   ° CARDIAC CATHETERIZATION  2012  ° no stents/no blockages  ° COLONOSCOPY    ° DILATION AND CURETTAGE OF UTERUS  1982  ° HIP SURGERY Left jan 2005 and oct 2005  ° left hip x 2 ( pt report that first hip failed, revision was needed)  ° TOTAL HIP ARTHROPLASTY Right 03/31/2019  ° Procedure: Right Anterior Total Hip Arthroplasty;  Surgeon: Rowan, Frank, MD;  Location: [facility];  Service: Orthopedics;  Laterality: Right;  ° WRIST SURGERY  2007  ° right wrist orif plates and 12 screws still in  ° ° °Prior to Admission medications   °Medication Sig Start Date End Date Taking? Authorizing Provider  °albuterol (VENTOLIN HFA) 108 (90 Base) MCG/ACT inhaler Inhale into the lungs every 6 (six) hours as needed for wheezing or shortness of breath.   Yes [provider]  °clonazePAM (KLONOPIN) 1 MG tablet Take 1 mg by mouth 2 (two) times daily.   Yes [provider]  °lamoTRIgine (LAMICTAL) 150 MG tablet Take 150 mg by mouth daily.   Yes [provider]  °OVER THE COUNTER MEDICATION Vitamin d 1000 units daily   Yes [provider]  °propranolol (INDERAL)   20 MG tablet Take 20 mg by mouth as needed. For tachacardia   Yes [provider]  °rosuvastatin (CRESTOR) 20 MG tablet Take 40 mg by mouth every evening.   Yes [provider]  °traMADol (ULTRAM) 50 MG tablet Take 50 mg by mouth every 6 (six) hours as needed. 07/13/21  Yes [provider]  °Cholecalciferol 1.25 MG (50000 UT) capsule Take 50,000 Units by mouth daily. Sunday    [provider]  °dicyclomine (BENTYL) 20 MG tablet Take 1 tablet (20 mg total) by mouth 3 (three) times daily as needed for spasms. 10/09/21   Pyrtle, Jay M, MD  °escitalopram (LEXAPRO) 10 MG tablet Take 10 mg by mouth daily.    [provider]  °FLOWFLEX COVID-19 AG HOME TEST KIT TEST AS DIRECTED TODAY °Patient not taking: Reported on 11/27/2021 08/21/21   [provider]  °omeprazole (PRILOSEC) 20 MG capsule Take 20 mg by  mouth as needed.    [provider]  °PFIZER COVID-19 VAC BIVALENT injection  07/16/21   [provider]  °topiramate (TOPAMAX) 25 MG tablet topiramate 25 mg tablet ° TAKE 1 TABLET BY MOUTH TWICE DAILY IN THE MORNING AND IN THE EVENING °Patient not taking: Reported on 11/27/2021 10/31/21   [provider]  ° ° °Current Outpatient Medications  °Medication Sig Dispense Refill  ° albuterol (VENTOLIN HFA) 108 (90 Base) MCG/ACT inhaler Inhale into the lungs every 6 (six) hours as needed for wheezing or shortness of breath.    ° clonazePAM (KLONOPIN) 1 MG tablet Take 1 mg by mouth 2 (two) times daily.    ° lamoTRIgine (LAMICTAL) 150 MG tablet Take 150 mg by mouth daily.    ° OVER THE COUNTER MEDICATION Vitamin d 1000 units daily    ° propranolol (INDERAL) 20 MG tablet Take 20 mg by mouth as needed. For tachacardia    ° rosuvastatin (CRESTOR) 20 MG tablet Take 40 mg by mouth every evening.    ° traMADol (ULTRAM) 50 MG tablet Take 50 mg by mouth every 6 (six) hours as needed.    ° Cholecalciferol 1.25 MG (50000 UT) capsule Take 50,000 Units by mouth daily. Sunday    ° dicyclomine (BENTYL) 20 MG tablet Take 1 tablet (20 mg total) by mouth 3 (three) times daily as needed for spasms. 90 tablet 1  ° escitalopram (LEXAPRO) 10 MG tablet Take 10 mg by mouth daily.    ° FLOWFLEX COVID-19 AG HOME TEST KIT TEST AS DIRECTED TODAY (Patient not taking: Reported on 11/27/2021)    ° omeprazole (PRILOSEC) 20 MG capsule Take 20 mg by mouth as needed.    ° PFIZER COVID-19 VAC BIVALENT injection     ° topiramate (TOPAMAX) 25 MG tablet topiramate 25 mg tablet ° TAKE 1 TABLET BY MOUTH TWICE DAILY IN THE MORNING AND IN THE EVENING (Patient not taking: Reported on 11/27/2021)    ° °Current Facility-Administered Medications  °Medication Dose Route Frequency Provider Last Rate Last Admin  ° 0.9 %  sodium chloride infusion  500 mL Intravenous Once Pyrtle, Jay M, MD      ° ° °Allergies as of 11/27/2021 - Review Complete  11/27/2021  °Allergen Reaction Noted  ° Metronidazole Nausea And Vomiting and Other (See Comments) 08/07/2021  ° Sulfamethoxazole-trimethoprim Other (See Comments) 11/17/2021  ° Cefdinir Nausea Only and Other (See Comments) 10/22/2014  ° Corticosteroids Other (See Comments) and Palpitations 11/17/2021  ° ° °Family History  °Problem Relation Age of Onset  ° Colon   Colon cancer Mother    Heart attack Father    Bipolar disorder Sister    Colon polyps Sister    Heart disease Brother    Colon polyps Brother    Transient ischemic attack Brother    Esophageal cancer Neg Hx    Stomach cancer Neg Hx    Rectal cancer Neg Hx     Social History   Socioeconomic History   Marital status: Divorced    Spouse name: Not on file   Number of children: Not on file   Years of education: Not on file   Highest education level: Not on file  Occupational History   Not on file  Tobacco Use   Smoking status: Every Day    Packs/day: 0.50    Years: 30.00    Pack years: 15.00    Types: Cigarettes    Last attempt to quit: 01/05/2021    Years since quitting: 0.8   Smokeless tobacco: Never   Tobacco comments:    smokes 10 cigarettes a day  Vaping Use   Vaping Use: Never used  Substance and Sexual Activity   Alcohol use: No   Drug use: Not Currently   Sexual activity: Not Currently    Birth control/protection: Post-menopausal  Other Topics Concern   Not on file  Social History Narrative   Not on file   Social Determinants of Health   Financial Resource Strain: Not on file  Food Insecurity: Not on file  Transportation Needs: Not on file  Physical Activity: Not on file  Stress: Not on file  Social Connections: Not on file  Intimate Partner Violence: Not on file    Physical Exam: Vital signs in last 24 hours: _0  117/65    Pulse 76    Temp 97.7 F (36.5 C)    Ht 5' 6" (1.676 m)    Wt 190 lb (86.2 kg)    SpO2 98%    BMI 30.67 kg/m  GEN: NAD EYE: Sclerae anicteric ENT: MMM CV: Non-tachycardic Pulm:  CTA b/l GI: Soft, NT/ND NEURO:  Alert & Oriented x 3   Zenovia Jarred, MD [facility]  11/27/2021 11:07 AM

## 2021-11-29 ENCOUNTER — Telehealth: Payer: Self-pay

## 2021-11-29 NOTE — Telephone Encounter (Signed)
°  Follow up Call-  Call back number 11/27/2021  Post procedure Call Back phone  # [phone] cell  Permission to leave phone message Yes  Some recent data might be hidden     Patient questions:  Do you have a fever, pain , or abdominal swelling? No. Pain Score  0 *  Have you tolerated food without any problems? Yes.    Have you been able to return to your normal activities? Yes.    Do you have any questions about your discharge instructions: Diet   No. Medications  No. Follow up visit  No.  Do you have questions or concerns about your Care? No.  Actions: * If pain score is 4 or above: No action needed, pain <4.  Have you developed a fever since your procedure? no  2.   Have you had an respiratory symptoms (SOB or cough) since your procedure? no  3.   Have you tested positive for COVID 19 since your procedure no  4.   Have you had any family members/close contacts diagnosed with the COVID 19 since your procedure?  no   If yes to any of these questions please route to Joylene John, RN and Joella Prince, RN

## 2021-11-30 DIAGNOSIS — N6489 Other specified disorders of breast: Secondary | ICD-10-CM | POA: Diagnosis not present

## 2021-11-30 DIAGNOSIS — R059 Cough, unspecified: Secondary | ICD-10-CM | POA: Diagnosis not present

## 2021-11-30 DIAGNOSIS — R062 Wheezing: Secondary | ICD-10-CM | POA: Diagnosis not present

## 2021-11-30 DIAGNOSIS — J189 Pneumonia, unspecified organism: Secondary | ICD-10-CM | POA: Insufficient documentation

## 2021-12-03 ENCOUNTER — Encounter: Payer: Self-pay | Admitting: Internal Medicine

## 2021-12-04 ENCOUNTER — Other Ambulatory Visit: Payer: Self-pay | Admitting: Family Medicine

## 2021-12-04 DIAGNOSIS — N6489 Other specified disorders of breast: Secondary | ICD-10-CM

## 2021-12-06 DIAGNOSIS — F319 Bipolar disorder, unspecified: Secondary | ICD-10-CM | POA: Insufficient documentation

## 2021-12-06 DIAGNOSIS — E782 Mixed hyperlipidemia: Secondary | ICD-10-CM | POA: Diagnosis not present

## 2021-12-06 DIAGNOSIS — E559 Vitamin D deficiency, unspecified: Secondary | ICD-10-CM | POA: Diagnosis not present

## 2021-12-06 DIAGNOSIS — F17210 Nicotine dependence, cigarettes, uncomplicated: Secondary | ICD-10-CM | POA: Diagnosis not present

## 2021-12-06 DIAGNOSIS — R000 Tachycardia, unspecified: Secondary | ICD-10-CM | POA: Diagnosis not present

## 2021-12-06 DIAGNOSIS — M5136 Other intervertebral disc degeneration, lumbar region: Secondary | ICD-10-CM | POA: Diagnosis not present

## 2021-12-06 DIAGNOSIS — G43009 Migraine without aura, not intractable, without status migrainosus: Secondary | ICD-10-CM | POA: Diagnosis not present

## 2021-12-06 DIAGNOSIS — F411 Generalized anxiety disorder: Secondary | ICD-10-CM | POA: Insufficient documentation

## 2021-12-06 DIAGNOSIS — R7303 Prediabetes: Secondary | ICD-10-CM | POA: Diagnosis not present

## 2021-12-06 DIAGNOSIS — M13872 Other specified arthritis, left ankle and foot: Secondary | ICD-10-CM | POA: Diagnosis not present

## 2021-12-06 DIAGNOSIS — Z96642 Presence of left artificial hip joint: Secondary | ICD-10-CM | POA: Diagnosis not present

## 2021-12-24 ENCOUNTER — Ambulatory Visit: Payer: Medicare Other

## 2021-12-24 ENCOUNTER — Ambulatory Visit
Admission: RE | Admit: 2021-12-24 | Discharge: 2021-12-24 | Disposition: A | Discharge status: Home / Self Care | Payer: Medicare Other | Source: Ambulatory Visit | Attending: Family Medicine | Admitting: Family Medicine

## 2021-12-24 DIAGNOSIS — R928 Other abnormal and inconclusive findings on diagnostic imaging of breast: Secondary | ICD-10-CM | POA: Diagnosis not present

## 2021-12-24 DIAGNOSIS — N6489 Other specified disorders of breast: Secondary | ICD-10-CM

## 2022-02-07 DIAGNOSIS — E782 Mixed hyperlipidemia: Secondary | ICD-10-CM | POA: Diagnosis not present

## 2022-02-07 DIAGNOSIS — E559 Vitamin D deficiency, unspecified: Secondary | ICD-10-CM | POA: Diagnosis not present

## 2022-02-07 DIAGNOSIS — R000 Tachycardia, unspecified: Secondary | ICD-10-CM | POA: Diagnosis not present

## 2022-02-07 DIAGNOSIS — R7303 Prediabetes: Secondary | ICD-10-CM | POA: Diagnosis not present

## 2022-02-14 DIAGNOSIS — M13872 Other specified arthritis, left ankle and foot: Secondary | ICD-10-CM | POA: Diagnosis not present

## 2022-02-14 DIAGNOSIS — F17210 Nicotine dependence, cigarettes, uncomplicated: Secondary | ICD-10-CM | POA: Diagnosis not present

## 2022-02-14 DIAGNOSIS — E782 Mixed hyperlipidemia: Secondary | ICD-10-CM | POA: Diagnosis not present

## 2022-02-14 DIAGNOSIS — R7303 Prediabetes: Secondary | ICD-10-CM | POA: Diagnosis not present

## 2022-02-14 DIAGNOSIS — G43009 Migraine without aura, not intractable, without status migrainosus: Secondary | ICD-10-CM | POA: Diagnosis not present

## 2022-02-14 DIAGNOSIS — M5136 Other intervertebral disc degeneration, lumbar region: Secondary | ICD-10-CM | POA: Diagnosis not present

## 2022-02-14 DIAGNOSIS — Z96642 Presence of left artificial hip joint: Secondary | ICD-10-CM | POA: Diagnosis not present

## 2022-02-14 DIAGNOSIS — R000 Tachycardia, unspecified: Secondary | ICD-10-CM | POA: Diagnosis not present

## 2022-02-14 DIAGNOSIS — E559 Vitamin D deficiency, unspecified: Secondary | ICD-10-CM | POA: Diagnosis not present

## 2022-04-11 ENCOUNTER — Other Ambulatory Visit: Payer: Self-pay | Admitting: Internal Medicine

## 2022-04-11 DIAGNOSIS — Z1231 Encounter for screening mammogram for malignant neoplasm of breast: Secondary | ICD-10-CM

## 2022-04-22 ENCOUNTER — Other Ambulatory Visit: Payer: Self-pay

## 2022-04-22 ENCOUNTER — Telehealth: Payer: Self-pay | Admitting: Internal Medicine

## 2022-04-22 MED ORDER — CIPROFLOXACIN HCL 500 MG PO TABS: 500.0000 mg | ORAL_TABLET | Freq: Two times a day (BID) | ORAL | 0 refills | Status: DC

## 2022-04-22 NOTE — Telephone Encounter (Signed)
Spoke with pt and she is aware of Dr. Pyrlte's recommendations. Script sent to pharmacy. 

## 2022-04-22 NOTE — Telephone Encounter (Signed)
Inbound call from patient stated that she was seen in February for a "scope" for   previous bloating a cramping. Patient stated that Dr. Hilarie Fredrickson told her if the symptoms come back he would start her on an antibiotic. Patient is requesting a call back to discuss getting a  prescription. Please advise.

## 2022-04-22 NOTE — Telephone Encounter (Signed)
Pt states she is having the same symptoms she had in the past when she had SIBO. She could not afford xifaxin in the past and Dr. Levie Heritage prescribed Cipro. She is calling to see if she can get another round of cipro. Please advise.

## 2022-04-22 NOTE — Telephone Encounter (Signed)
Okay to retreat SIBO Cipro 500 mg twice daily x10 days She should let us know if ineffective or if she has any issues with this medication

## 2022-05-03 NOTE — Telephone Encounter (Signed)
Incoming call from patient stating the medication has cleared up her symptoms.Please give a call if needing to further advise.  Thank you

## 2022-05-07 ENCOUNTER — Ambulatory Visit
Admission: RE | Admit: 2022-05-07 | Discharge: 2022-05-07 | Disposition: A | Discharge status: Home / Self Care | Payer: Medicare Other | Source: Ambulatory Visit | Attending: Internal Medicine | Admitting: Internal Medicine

## 2022-05-07 DIAGNOSIS — Z1231 Encounter for screening mammogram for malignant neoplasm of breast: Secondary | ICD-10-CM | POA: Diagnosis not present

## 2022-05-24 DIAGNOSIS — M5136 Other intervertebral disc degeneration, lumbar region: Secondary | ICD-10-CM | POA: Diagnosis not present

## 2022-05-24 DIAGNOSIS — H02539 Eyelid retraction unspecified eye, unspecified lid: Secondary | ICD-10-CM | POA: Diagnosis not present

## 2022-05-24 DIAGNOSIS — M13872 Other specified arthritis, left ankle and foot: Secondary | ICD-10-CM | POA: Diagnosis not present

## 2022-05-24 DIAGNOSIS — E782 Mixed hyperlipidemia: Secondary | ICD-10-CM | POA: Diagnosis not present

## 2022-06-18 DIAGNOSIS — M5416 Radiculopathy, lumbar region: Secondary | ICD-10-CM | POA: Diagnosis not present

## 2022-06-20 DIAGNOSIS — M48061 Spinal stenosis, lumbar region without neurogenic claudication: Secondary | ICD-10-CM | POA: Diagnosis not present

## 2022-06-20 DIAGNOSIS — M5127 Other intervertebral disc displacement, lumbosacral region: Secondary | ICD-10-CM | POA: Diagnosis not present

## 2022-06-20 DIAGNOSIS — M5416 Radiculopathy, lumbar region: Secondary | ICD-10-CM | POA: Diagnosis not present

## 2022-07-04 DIAGNOSIS — M5416 Radiculopathy, lumbar region: Secondary | ICD-10-CM | POA: Diagnosis not present

## 2022-07-04 DIAGNOSIS — M7062 Trochanteric bursitis, left hip: Secondary | ICD-10-CM | POA: Diagnosis not present

## 2022-07-12 ENCOUNTER — Emergency Department (HOSPITAL_COMMUNITY)
Admission: EM | Admit: 2022-07-12 | Discharge: 2022-07-12 | Disposition: A | Discharge status: Home / Self Care | Payer: Medicare Other | Attending: Emergency Medicine | Admitting: Emergency Medicine

## 2022-07-12 ENCOUNTER — Other Ambulatory Visit: Payer: Self-pay

## 2022-07-12 ENCOUNTER — Encounter (HOSPITAL_COMMUNITY): Payer: Self-pay

## 2022-07-12 ENCOUNTER — Emergency Department (HOSPITAL_COMMUNITY): Payer: Medicare Other

## 2022-07-12 DIAGNOSIS — R0789 Other chest pain: Secondary | ICD-10-CM | POA: Insufficient documentation

## 2022-07-12 DIAGNOSIS — Z79899 Other long term (current) drug therapy: Secondary | ICD-10-CM | POA: Diagnosis not present

## 2022-07-12 DIAGNOSIS — R6889 Other general symptoms and signs: Secondary | ICD-10-CM | POA: Diagnosis not present

## 2022-07-12 DIAGNOSIS — R002 Palpitations: Secondary | ICD-10-CM | POA: Insufficient documentation

## 2022-07-12 DIAGNOSIS — R079 Chest pain, unspecified: Secondary | ICD-10-CM

## 2022-07-12 DIAGNOSIS — Z743 Need for continuous supervision: Secondary | ICD-10-CM | POA: Diagnosis not present

## 2022-07-12 DIAGNOSIS — J0100 Acute maxillary sinusitis, unspecified: Secondary | ICD-10-CM | POA: Insufficient documentation

## 2022-07-12 LAB — BASIC METABOLIC PANEL
Anion gap: 8 (ref 5–15)
BUN: 21 mg/dL (ref 8–23)
CO2: 22 mmol/L (ref 22–32)
Calcium: 9.5 mg/dL (ref 8.9–10.3)
Chloride: 110 mmol/L (ref 98–111)
Creatinine, Ser: 0.75 mg/dL (ref 0.44–1.00)
GFR, Estimated: >60 mL/min (ref >60)
Glucose, Bld: 119 mg/dL — ABNORMAL HIGH (ref 70–99)
Potassium: 3.7 mmol/L (ref 3.5–5.1)
Sodium: 140 mmol/L (ref 135–145)

## 2022-07-12 LAB — CBC
HCT: 36.7 % (ref 36.0–46.0)
Hemoglobin: 12.2 g/dL (ref 12.0–15.0)
MCH: 33 pg (ref 26.0–34.0)
MCHC: 33.2 g/dL (ref 30.0–36.0)
MCV: 99.2 fL (ref 80.0–100.0)
Platelets: 315 10*3/uL (ref 150–400)
RBC: 3.7 MIL/uL — ABNORMAL LOW (ref 3.87–5.11)
RDW: 13.3 % (ref 11.5–15.5)
WBC: 6.9 10*3/uL (ref 4.0–10.5)
nRBC: 0 % (ref 0.0–0.2)

## 2022-07-12 LAB — TROPONIN I (HIGH SENSITIVITY)
Troponin I (High Sensitivity): 3 ng/L (ref <18)
Troponin I (High Sensitivity): 4 ng/L (ref <18)

## 2022-07-12 LAB — D-DIMER, QUANTITATIVE: D-Dimer, Quant: <0.27 ug/mL-FEU (ref 0.00–0.50)

## 2022-07-12 MED ORDER — ONDANSETRON HCL 4 MG/2ML IJ SOLN
4.0000 mg | Freq: Once | INTRAMUSCULAR | Status: AC
  Administered 2022-07-12: 4 mg via INTRAVENOUS
  Filled 2022-07-12: qty 2

## 2022-07-12 MED ORDER — DIPHENHYDRAMINE HCL 50 MG/ML IJ SOLN
25.0000 mg | Freq: Once | INTRAMUSCULAR | Status: AC
  Administered 2022-07-12: 25 mg via INTRAVENOUS
  Filled 2022-07-12: qty 1

## 2022-07-12 MED ORDER — MORPHINE SULFATE (PF) 4 MG/ML IV SOLN
4.0000 mg | Freq: Once | INTRAVENOUS | Status: AC
  Administered 2022-07-12: 4 mg via INTRAVENOUS
  Filled 2022-07-12: qty 1

## 2022-07-12 NOTE — ED Notes (Signed)
Dr. Pollina at bedside   

## 2022-07-12 NOTE — ED Notes (Signed)
Pt stated she was feeling " extremely nausea" and was feeling chest discomfort, MD notified.

## 2022-07-12 NOTE — Discharge Instructions (Signed)
Thank you for coming to [facility] Emergency Department. You were seen for chest pain. We did an exam, labs, and imaging, and these showed no acute findings.  Please follow up with your primary care provider within 1 week. We have also placed a referral for you to follow up with cardiology. They will call you to make an appointment, and if they do not, please call their [facility] office.   Do not hesitate to return to the ED or call 911 if you experience: -Worsening symptoms -Lightheadedness, passing out -Fevers/chills -Anything else that concerns you

## 2022-07-12 NOTE — ED Triage Notes (Signed)
PER EMS: pt is from home with c/o sudden onset of tightness in her upper chest that radiated into her back that woke up her up from sleep around 0200. 12 lead unremarkable.She also reports bilateral calf pain and burning to the soles of her feet, all at the same time as her CP. EMS adm 324 asa, 1 nitro tab which decreased her CP from an 8/10 to a 2/10.  20g left hand  BP-98/54, HR-60, 95% RA, GCS 15

## 2022-07-12 NOTE — ED Provider Notes (Signed)
7:17 AM Assumed care of patient from off-going team. For more details, please see note from same day.  In brief, this is a 66 y.o. female with CP relieved with nitro e/ EMS.   Plan/Dispo at time of sign-out & ED Course since sign-out: Repeat troponin  BP (!) 100/54   Pulse 62   Temp 98.2 F (36.8 C) (Oral)   Resp 20   Ht '5\' 6"'$  (1.676 m)   Wt 83.5 kg   SpO2 98%   BMI 29.70 kg/m    ED Course:   9:18 AM Patient's 2nd trop flat. Patient feels improved. Given a referral to cardiology for f/u; heart score of 3 for story and age.       Dispo: DC w/ discharge instructions/return precautions ------------------------------- Cindee Lame, MD Emergency Medicine  This note was created using dictation software, which may contain spelling or grammatical errors.   Audley Hose, MD 07/20/22 [phone]

## 2022-07-17 ENCOUNTER — Telehealth: Payer: Self-pay

## 2022-07-17 DIAGNOSIS — R309 Painful micturition, unspecified: Secondary | ICD-10-CM | POA: Diagnosis not present

## 2022-07-17 DIAGNOSIS — N390 Urinary tract infection, site not specified: Secondary | ICD-10-CM | POA: Diagnosis not present

## 2022-07-17 NOTE — Telephone Encounter (Signed)
     Patient  visit on 10/6  at [facility]   Have you been able to follow up with your primary care physician? yes  The patient was or was not able to obtain any needed medicine or equipment.na  Are there diet recommendations that you are having difficulty following?na  Patient expresses understanding of discharge instructions and education provided has no other needs at this time. yes     [place], Care Management  [phone] [address], [place], [place] [address] Phone: [phone] Email: [email or web address]

## 2022-07-18 ENCOUNTER — Encounter: Payer: Self-pay | Admitting: Internal Medicine

## 2022-07-18 ENCOUNTER — Ambulatory Visit: Payer: Medicare Other | Attending: Internal Medicine | Admitting: Internal Medicine

## 2022-07-18 VITALS — BP 118/72 | HR 63 | Ht 66.0 in | Wt 188.4 lb

## 2022-07-18 DIAGNOSIS — R079 Chest pain, unspecified: Secondary | ICD-10-CM | POA: Diagnosis not present

## 2022-07-18 DIAGNOSIS — R072 Precordial pain: Secondary | ICD-10-CM | POA: Diagnosis not present

## 2022-07-18 MED ORDER — METOPROLOL TARTRATE 50 MG PO TABS: 50.0000 mg | ORAL_TABLET | Freq: Once | ORAL | 0 refills | Status: DC

## 2022-07-18 NOTE — Patient Instructions (Addendum)
Medication Instructions:  No Changes In Medications at this time.  *If you need a refill on your cardiac medications before your next appointment, please call your pharmacy*  Lab Work: BLOOD WORK TODAY  If you have labs (blood work) drawn today and your tests are completely normal, you will receive your results only by: [place] (if you have MyChart) OR A paper copy in the mail If you have any lab test that is abnormal or we need to change your treatment, we will call you to review the results.  Testing/Procedures: Your physician has requested that you have cardiac CT. Cardiac computed tomography (CT) is a painless test that uses an x-ray machine to take clear, detailed pictures of your heart. For further information please visit HugeFiesta.tn. Please follow instruction sheet as given.   Your physician has requested that you have an echocardiogram. Echocardiography is a painless test that uses sound waves to create images of your heart. It provides your doctor with information about the size and shape of your heart and how well your heart's chambers and valves are working. You may receive an ultrasound enhancing agent through an IV if needed to better visualize your heart during the echo.This procedure takes approximately one hour. There are no restrictions for this procedure. This will take place at the [address], [address].   Follow-Up: At [facility], you and your health needs are our priority.  As part of our continuing mission to provide you with exceptional heart care, we have created designated Provider Care Teams.  These Care Teams include your primary Cardiologist (physician) and Advanced Practice Providers (APPs -  Physician Assistants and Nurse Practitioners) who all work together to provide you with the care you need, when you need it.  Your next appointment:   AS NEEDED   The format for your next appointment:   In Person  Provider:   Janina Mayo, MD       Your cardiac CT will be scheduled at one of the below locations:   [facility] [address] [place], [place] [address] [phone]  If scheduled at [facility], please arrive at the [facility] and Children's Entrance (Entrance C2) of [facility] 30 minutes prior to test start time. You can use the FREE valet parking offered at entrance C (encouraged to control the heart rate for the test)  Proceed to the [facility] Radiology Department (first floor) to check-in and test prep.  All radiology patients and guests should use entrance C2 at [facility], accessed from [facility], even though the hospital's physical address listed is [address].     Please follow these instructions carefully (unless otherwise directed):  Hold all erectile dysfunction medications at least 3 days (72 hrs) prior to test. (Ie viagra, cialis, sildenafil, tadalafil, etc) We will administer nitroglycerin during this exam.   On the Night Before the Test: Be sure to Drink plenty of water. Do not consume any caffeinated/decaffeinated beverages or chocolate 12 hours prior to your test. Do not take any antihistamines 12 hours prior to your test.  On the Day of the Test: Drink plenty of water until 1 hour prior to the test. Do not eat any food 1 hour prior to test. You may take your regular medications prior to the test.  Take metoprolol (Lopressor) two hours prior to test. HOLD Furosemide/Hydrochlorothiazide morning of the test. FEMALES- please wear underwire-free bra if available, avoid  dresses & tight clothing  After the Test: Drink plenty of water. After receiving IV contrast, you may experience a mild flushed feeling. This is normal. On occasion, you may experience a mild rash up to 24 hours after the test. This is not dangerous. If this occurs, you can take Benadryl 25 mg and increase your fluid intake. If you experience trouble  breathing, this can be serious. If it is severe call 911 IMMEDIATELY. If it is mild, please call our office. If you take any of these medications: Glipizide/Metformin, Avandament, Glucavance, please do not take 48 hours after completing test unless otherwise instructed.  We will call to schedule your test 2-4 weeks out understanding that some insurance companies will need an authorization prior to the service being performed.   For non-scheduling related questions, please contact the cardiac imaging nurse navigator should you have any questions/concerns: Marchia Bond, Cardiac Imaging Nurse Navigator Gordy Clement, Cardiac Imaging Nurse Navigator [facility] Direct Office Dial: [phone]   For scheduling needs, including cancellations and rescheduling, please call [place], [phone].

## 2022-07-18 NOTE — Progress Notes (Signed)
Cardiology Office Note:    Date:  07/18/2022   ID:  Sandra Conrad, DOB Apr 28, 1956, MRN [id number]  PCP:  Celene Squibb, MD   [facility] Prov[facility] Cardiologist:  Janina Mayo, MD     Referring MD: Celene Squibb, MD   Chief Complaint  Patient presents with   New Patient (Initial Visit)  Chest pain  History of Present Illness:    Sandra Conrad is a 66 y.o. female with a hx of  R breast cancer, s/p lumpectomy (on letrozole ER+) R sided radiation,  , referral for CP from the ED. Troponin was negative.  ECG-NSR with early repol. Appears to be new since 2020. Xray was unremarkable  She states she was sleeping and woke up in the AM. She had muscle cramps. Had R chest pain and went to her arm acutely. In [place] she had chest pain at work. This was June 2012. Her LHC was normal. No consistent cardiology FU needed. She has been doing well from a cardiac standpoint since then.  She denies fevers chills. Her symptoms resolved. She just quit smoking, started in her 63s  Past Medical History:  Diagnosis Date   Abdominal pain    1 time in last 2 years ago/   Anxiety    Arthritis    Blood transfusion without reported diagnosis 2005   1 time   Cancer [facility]) 2011   Right breast IDC   Complication of anesthesia    Pt request scopolamine patch   Depression    Diarrhea    last time in 2013   Diverticulosis    Dysrhythmia    tachacardia managed by pcp    GERD (gastroesophageal reflux disease)    Hyperlipidemia    Insomnia    Lumbar herniated disc    L3-4 and L5   Lumbar nerve root impingement    right leg   Migraine    Neuropathy of foot    legs due to djd and spinal stenosis intermittent neuropathy   OA (osteoarthritis)    both hips and spine and left foot   Osteoporosis    Personal history of chemotherapy    oral/on meds for 4 years   Personal history of radiation therapy 2008   had 35 treatments/ last on 2008   PONV (postoperative nausea and vomiting)    extreme  ponv uses scopolamine patch   Tubular adenoma of colon    Vitamin D deficiency    Wears glasses     Past Surgical History:  Procedure Laterality Date   BACK SURGERY  08/21/2018   L4-L5 and laminectomy   BREAST EXCISIONAL BIOPSY Right 2008   BREAST LUMPECTOMY  2008   right breast   CARDIAC CATHETERIZATION  2012   no stents/no blockages   COLONOSCOPY     DILATION AND CURETTAGE OF UTERUS  1982   HIP SURGERY Left jan 2005 and oct 2005   left hip x 2 ( pt report that first hip failed, revision was needed)   TOTAL HIP ARTHROPLASTY Right 03/31/2019   Procedure: Right Anterior Total Hip Arthroplasty;  Surgeon: Frederik Pear, MD;  Location: [facility];  Service: Orthopedics;  Laterality: Right;   WRIST SURGERY  2007   right wrist orif plates and 12 screws still in    Current Medications: Current Meds  Medication Sig   albuterol (VENTOLIN HFA) 108 (90 Base) MCG/ACT inhaler Inhale into the lungs every 6 (six) hours as needed for wheezing or shortness of  breath.   Cholecalciferol 1.25 MG (50000 UT) capsule Take 50,000 Units by mouth daily. Sunday   clonazePAM (KLONOPIN) 1 MG tablet Take 1 mg by mouth 2 (two) times daily.   dicyclomine (BENTYL) 20 MG tablet Take 1 tablet (20 mg total) by mouth 3 (three) times daily as needed for spasms.   escitalopram (LEXAPRO) 10 MG tablet Take 10 mg by mouth daily.   lamoTRIgine (LAMICTAL) 150 MG tablet Take 150 mg by mouth daily.   metoprolol tartrate (LOPRESSOR) 50 MG tablet Take 1 tablet (50 mg total) by mouth once for 1 dose. PLEASE TAKE METOPROLOL 2  HOURS PRIOR TO CTA SCAN.   nitrofurantoin, macrocrystal-monohydrate, (MACROBID) 100 MG capsule Take 100 mg by mouth 2 (two) times daily. For 7 days   omeprazole (PRILOSEC) 20 MG capsule Take 20 mg by mouth as needed.   OVER THE COUNTER MEDICATION Vitamin d 1000 units daily   PFIZER COVID-19 VAC BIVALENT injection    propranolol (INDERAL) 20 MG tablet Take 20 mg by mouth as needed. For tachacardia    rosuvastatin (CRESTOR) 20 MG tablet Take 40 mg by mouth every evening.   topiramate (TOPAMAX) 25 MG tablet    traMADol (ULTRAM) 50 MG tablet Take 50 mg by mouth every 6 (six) hours as needed.     Allergies:   Metronidazole, Sulfamethoxazole-trimethoprim, and Cefdinir   Social History   Socioeconomic History   Marital status: Divorced    Spouse name: Not on file   Number of children: Not on file   Years of education: Not on file   Highest education level: Not on file  Occupational History   Not on file  Tobacco Use   Smoking status: Every Day    Packs/day: 0.50    Years: 30.00    Total pack years: 15.00    Types: Cigarettes    Last attempt to quit: 01/05/2021    Years since quitting: 1.5   Smokeless tobacco: Never   Tobacco comments:    smokes 10 cigarettes a day  Vaping Use   Vaping Use: Never used  Substance and Sexual Activity   Alcohol use: No   Drug use: Not Currently   Sexual activity: Not Currently    Birth control/protection: Post-menopausal  Other Topics Concern   Not on file  Social History Narrative   Not on file   Social Determinants of Health   Financial Resource Strain: Not on file  Food Insecurity: Not on file  Transportation Needs: Not on file  Physical Activity: Not on file  Stress: Not on file  Social Connections: Not on file     Family History: The patient's family history includes Bipolar disorder in her sister; Colon cancer in her mother; Colon polyps in her brother and sister; Heart attack in her father; Heart disease in her brother; Transient ischemic attack in her brother. There is no history of Esophageal cancer, Stomach cancer, or Rectal cancer. Father died of MI when she was young. Brother - MI at age 64, CABG 2005 , stroke.   ROS:   Please see the history of present illness.     All other systems reviewed and are negative.  EKGs/Labs/Other Studies Reviewed:    The following studies were reviewed today:   EKG:  EKG is  ordered  today.  The ekg ordered today demonstrates   07/18/2022- NSR  Recent Labs: 08/07/2021: ALT 28 10/09/2021: TSH 0.71 07/12/2022: BUN 21; Creatinine, Ser 0.75; Hemoglobin 12.2; Platelets 315; Potassium 3.7; Sodium 140  Recent Lipid Panel No results found for: "CHOL", "TRIG", "HDL", "CHOLHDL", "VLDL", "LDLCALC", "LDLDIRECT"   Risk Assessment/Calculations:     Physical Exam:    VS:   Vitals:   07/18/22 1345  BP: 118/72  Pulse: 63  SpO2: 97%     Wt Readings from Last 3 Encounters:  07/18/22 188 lb 6.4 oz (85.5 kg)  07/12/22 184 lb (83.5 kg)  11/27/21 190 lb (86.2 kg)     GEN:  Well nourished, well developed in no acute distress HEENT: Normal NECK: No JVD; No carotid bruits LYMPHATICS: No lymphadenopathy CARDIAC: RRR, no murmurs, rubs, gallops RESPIRATORY:  Clear to auscultation without rales, wheezing or rhonchi  ABDOMEN: Soft, non-tender, non-distended MUSCULOSKELETAL:  No edema; No deformity  SKIN: Warm and dry NEUROLOGIC:  Alert and oriented x 3 PSYCHIATRIC:  Normal affect   ASSESSMENT:   CP: risk includes age, smoker, and family hx of premature CAD. Will get coronary CT. Had early repol on her EKG , pericarditis is possible will get TTE /CRP/ESR.  PLAN:    In order of problems listed above:  Coronary CTA with morph metoprolol tartrate 50 mg x1 TTE CRP, ESR Follow up pending results           Medication Adjustments/Labs and Tests Ordered: Current medicines are reviewed at length with the patient today.  Concerns regarding medicines are outlined above.  Orders Placed This Encounter  Procedures   CT CORONARY MORPH W/CTA COR W/SCORE W/CA W/CM &/OR WO/CM   Sed Rate (ESR)   C-reactive protein   Basic metabolic panel   EKG 38-[facility]   ECHOCARDIOGRAM COMPLETE   Meds ordered this encounter  Medications   metoprolol tartrate (LOPRESSOR) 50 MG tablet    Sig: Take 1 tablet (50 mg total) by mouth once for 1 dose. PLEASE TAKE METOPROLOL 2  HOURS PRIOR TO CTA  SCAN.    Dispense:  1 tablet    Refill:  0    Patient Instructions  Medication Instructions:  No Changes In Medications at this time.  *If you need a refill on your cardiac medications before your next appointment, please call your pharmacy*  Lab Work: BLOOD WORK TODAY  If you have labs (blood work) drawn today and your tests are completely normal, you will receive your results only by: [place] (if you have MyChart) OR A paper copy in the mail If you have any lab test that is abnormal or we need to change your treatment, we will call you to review the results.  Testing/Procedures: Your physician has requested that you have cardiac CT. Cardiac computed tomography (CT) is a painless test that uses an x-ray machine to take clear, detailed pictures of your heart. For further information please visit HugeFiesta.tn. Please follow instruction sheet as given.   Your physician has requested that you have an echocardiogram. Echocardiography is a painless test that uses sound waves to create images of your heart. It provides your doctor with information about the size and shape of your heart and how well your heart's chambers and valves are working. You may receive an ultrasound enhancing agent through an IV if needed to better visualize your heart during the echo.This procedure takes approximately one hour. There are no restrictions for this procedure. This will take place at the [address], [address].   Follow-Up: At [facility], you and your health needs are our priority.  As part of our continuing mission to provide you with exceptional heart care, we  have created designated Provider Care Teams.  These Care Teams include your primary Cardiologist (physician) and Advanced Practice Providers (APPs -  Physician Assistants and Nurse Practitioners) who all work together to provide you with the care you need, when you need it.  Your next appointment:   AS NEEDED   The  format for your next appointment:   In Person  Provider:   Janina Mayo, MD       Your cardiac CT will be scheduled at one of the below locations:   [facility] [address] [place], [place] [address] [phone]  If scheduled at [facility], please arrive at the [place] and Children's Entrance (Entrance C2) of [facility] 30 minutes prior to test start time. You can use the FREE valet parking offered at entrance C (encouraged to control the heart rate for the test)  Proceed to the [facility] Radiology Department (first floor) to check-in and test prep.  All radiology patients and guests should use entrance C2 at [facility], accessed from [facility], even though the hospital's physical address listed is [address]     Please follow these instructions carefully (unless otherwise directed):  Hold all erectile dysfunction medications at least 3 days (72 hrs) prior to test. (Ie viagra, cialis, sildenafil, tadalafil, etc) We will administer nitroglycerin during this exam.   On the Night Before the Test: Be sure to Drink plenty of water. Do not consume any caffeinated/decaffeinated beverages or chocolate 12 hours prior to your test. Do not take any antihistamines 12 hours prior to your test.  On the Day of the Test: Drink plenty of water until 1 hour prior to the test. Do not eat any food 1 hour prior to test. You may take your regular medications prior to the test.  Take metoprolol (Lopressor) two hours prior to test. HOLD Furosemide/Hydrochlorothiazide morning of the test. FEMALES- please wear underwire-free bra if available, avoid dresses & tight clothing  After the Test: Drink plenty of water. After receiving IV contrast, you may experience a mild flushed feeling. This is normal. On occasion, you may experience a mild rash up to 24 hours after the test. This is not dangerous. If this occurs, you can take  Benadryl 25 mg and increase your fluid intake. If you experience trouble breathing, this can be serious. If it is severe call 911 IMMEDIATELY. If it is mild, please call our office. If you take any of these medications: Glipizide/Metformin, Avandament, Glucavance, please do not take 48 hours after completing test unless otherwise instructed.  We will call to schedule your test 2-4 weeks out understanding that some insurance companies will need an authorization prior to the service being performed.   For non-scheduling related questions, please contact the cardiac imaging nurse navigator should you have any questions/concerns: Marchia Bond, Cardiac Imaging Nurse Navigator Gordy Clement, Cardiac Imaging Nurse Navigator [place] [facility] Direct Office Dial: [phone]   For scheduling needs, including cancellations and rescheduling, please call [place], [phone].          Signed, Janina Mayo, MD  07/18/2022 2:21 PM    [place]

## 2022-07-19 LAB — SEDIMENTATION RATE: Sed Rate: 16 mm/hr (ref 0–40)

## 2022-07-19 LAB — BASIC METABOLIC PANEL
BUN/Creatinine Ratio: 19 (ref 12–28)
BUN: 15 mg/dL (ref 8–27)
CO2: 23 mmol/L (ref 20–29)
Calcium: 9.4 mg/dL (ref 8.7–10.3)
Chloride: 102 mmol/L (ref 96–106)
Creatinine, Ser: 0.81 mg/dL (ref 0.57–1.00)
Glucose: 88 mg/dL (ref 70–99)
Potassium: 4.4 mmol/L (ref 3.5–5.2)
Sodium: 137 mmol/L (ref 134–144)
eGFR: 80 mL/min/{1.73_m2} (ref >59)

## 2022-07-19 LAB — C-REACTIVE PROTEIN: CRP: <1 mg/L (ref 0–10)

## 2022-07-25 ENCOUNTER — Telehealth: Payer: Self-pay | Admitting: Internal Medicine

## 2022-07-25 MED ORDER — CIPROFLOXACIN HCL 500 MG PO TABS: 500.0000 mg | ORAL_TABLET | Freq: Two times a day (BID) | ORAL | 0 refills | Status: DC

## 2022-07-25 NOTE — Telephone Encounter (Signed)
Inbound call from patient states she is have a flare up and is requesting a refill on her rx cipro, please advise.

## 2022-07-25 NOTE — Telephone Encounter (Signed)
Sandra Conrad said she is having a SIBO flare-onset Tuesday night-severe bloating, cramping, gas, upper epigastric pain. She said Dr Hilarie Fredrickson tried to treat her with xifaxan and it was too expensive. Dr Hilarie Fredrickson said for her to call if having a flare and he would rx Cipro. I confirmed pharmacy-[facility]. Please advise Sir, thank you.

## 2022-07-25 NOTE — Telephone Encounter (Signed)
Cipro 500 mg BID x 7 days ?

## 2022-07-25 NOTE — Telephone Encounter (Signed)
I called and told Taffy that the cipro has been sent in. She will update [place] when she finishes the course. She said thank you Dr Hilarie Fredrickson.

## 2022-07-30 ENCOUNTER — Ambulatory Visit (HOSPITAL_COMMUNITY): Payer: Medicare Other | Attending: Internal Medicine

## 2022-07-30 DIAGNOSIS — R072 Precordial pain: Secondary | ICD-10-CM | POA: Insufficient documentation

## 2022-07-31 ENCOUNTER — Telehealth (HOSPITAL_COMMUNITY): Payer: Self-pay | Admitting: *Deleted

## 2022-07-31 NOTE — Telephone Encounter (Signed)
Reaching out to patient to offer assistance regarding upcoming cardiac imaging study; pt verbalizes understanding of appt date/time, parking situation and where to check in, medications ordered, and verified current allergies; name and call back number provided for further questions should they arise  Gordy Clement RN Navigator Cardiac Imaging [facility] [phone] office [phone] cell  Patient to take '50mg'$  metoprolol tartrate two hours prior to her cardiac CT scan and hold her propanolol. She is aware to arrive at 11am.

## 2022-08-01 ENCOUNTER — Other Ambulatory Visit: Payer: Self-pay | Admitting: Cardiovascular Disease

## 2022-08-01 ENCOUNTER — Ambulatory Visit (HOSPITAL_COMMUNITY)
Admission: RE | Admit: 2022-08-01 | Discharge: 2022-08-01 | Disposition: A | Discharge status: Home / Self Care | Payer: Medicare Other | Source: Ambulatory Visit | Attending: Internal Medicine | Admitting: Internal Medicine

## 2022-08-01 DIAGNOSIS — I700 Atherosclerosis of aorta: Secondary | ICD-10-CM | POA: Diagnosis not present

## 2022-08-01 DIAGNOSIS — R072 Precordial pain: Secondary | ICD-10-CM | POA: Diagnosis not present

## 2022-08-01 DIAGNOSIS — R931 Abnormal findings on diagnostic imaging of heart and coronary circulation: Secondary | ICD-10-CM | POA: Insufficient documentation

## 2022-08-01 DIAGNOSIS — I2510 Atherosclerotic heart disease of native coronary artery without angina pectoris: Secondary | ICD-10-CM

## 2022-08-01 DIAGNOSIS — R079 Chest pain, unspecified: Secondary | ICD-10-CM

## 2022-08-01 MED ORDER — NITROGLYCERIN 0.4 MG SL SUBL
0.8000 mg | SUBLINGUAL_TABLET | Freq: Once | SUBLINGUAL | Status: AC
  Administered 2022-08-01: 0.8 mg via SUBLINGUAL

## 2022-08-01 MED ORDER — IOHEXOL 350 MG/ML SOLN
100.0000 mL | Freq: Once | INTRAVENOUS | Status: AC | PRN
  Administered 2022-08-01: 100 mL via INTRAVENOUS

## 2022-08-01 MED ORDER — NITROGLYCERIN 0.4 MG SL SUBL
SUBLINGUAL_TABLET | SUBLINGUAL | Status: AC
  Filled 2022-08-01: qty 2

## 2022-08-02 ENCOUNTER — Ambulatory Visit (HOSPITAL_COMMUNITY)
Admission: RE | Admit: 2022-08-02 | Discharge: 2022-08-02 | Disposition: A | Discharge status: Home / Self Care | Payer: Medicare Other | Source: Ambulatory Visit | Attending: Cardiovascular Disease | Admitting: Cardiovascular Disease

## 2022-08-02 ENCOUNTER — Ambulatory Visit (HOSPITAL_BASED_OUTPATIENT_CLINIC_OR_DEPARTMENT_OTHER)
Admission: RE | Admit: 2022-08-02 | Discharge: 2022-08-02 | Disposition: A | Discharge status: Home / Self Care | Payer: Medicare Other | Source: Ambulatory Visit | Attending: Cardiovascular Disease | Admitting: Cardiovascular Disease

## 2022-08-02 ENCOUNTER — Telehealth: Payer: Self-pay | Admitting: Internal Medicine

## 2022-08-02 DIAGNOSIS — R079 Chest pain, unspecified: Secondary | ICD-10-CM

## 2022-08-02 DIAGNOSIS — R931 Abnormal findings on diagnostic imaging of heart and coronary circulation: Secondary | ICD-10-CM | POA: Diagnosis not present

## 2022-08-02 DIAGNOSIS — I700 Atherosclerosis of aorta: Secondary | ICD-10-CM | POA: Diagnosis not present

## 2022-08-02 DIAGNOSIS — R072 Precordial pain: Secondary | ICD-10-CM | POA: Diagnosis not present

## 2022-08-02 NOTE — Telephone Encounter (Signed)
  Pt is calling to get echo and CT result

## 2022-08-02 NOTE — ED Provider Notes (Signed)
[facility] EMERGENCY DEPARTMENT Provider Note   CSN: [id number] Arrival date & time: 07/12/22  4259     History  Chief Complaint  Patient presents with   Chest Pain    Sandra Conrad is a 66 y.o. female.  Presented to the emergency department by ambulance from home.  Patient with sudden onset of tightness in her upper chest radiating into the back.  Patient reports that she was awakened from sleep with the symptoms.  Patient given aspirin and nitro by EMS, pain improved.       Home Medications Prior to Admission medications   Medication Sig Start Date End Date Taking? Authorizing Provider  albuterol (VENTOLIN HFA) 108 (90 Base) MCG/ACT inhaler Inhale into the lungs every 6 (six) hours as needed for wheezing or shortness of breath.    [provider]  Cholecalciferol 1.25 MG (50000 UT) capsule Take 50,000 Units by mouth daily. Sunday    [provider]  ciprofloxacin (CIPRO) 500 MG tablet Take 1 tablet (500 mg total) by mouth 2 (two) times daily. 07/25/22   Pyrtle, Lajuan Lines, MD  clonazePAM (KLONOPIN) 1 MG tablet Take 1 mg by mouth 2 (two) times daily.    [provider]  dicyclomine (BENTYL) 20 MG tablet Take 1 tablet (20 mg total) by mouth 3 (three) times daily as needed for spasms. 10/09/21   Pyrtle, Lajuan Lines, MD  escitalopram (LEXAPRO) 10 MG tablet Take 10 mg by mouth daily.    [provider]  [place] COVID-19 [place] TEST KIT TEST AS DIRECTED TODAY Patient not taking: Reported on 07/18/2022 08/21/21   [provider]  lamoTRIgine (LAMICTAL) 150 MG tablet Take 150 mg by mouth daily.    [provider]  metoprolol tartrate (LOPRESSOR) 50 MG tablet Take 1 tablet (50 mg total) by mouth once for 1 dose. PLEASE TAKE METOPROLOL 2  HOURS PRIOR TO CTA SCAN. 07/18/22 07/18/22  Janina Mayo, MD  nitrofurantoin, macrocrystal-monohydrate, (MACROBID) 100 MG capsule Take 100 mg by mouth 2 (two) times daily. For 7 days     [provider]  omeprazole (PRILOSEC) 20 MG capsule Take 20 mg by mouth as needed.    [provider]  OVER THE COUNTER MEDICATION Vitamin d 1000 units daily    [provider]  PFIZER COVID-19 VAC BIVALENT injection  07/16/21   [provider]  propranolol (INDERAL) 20 MG tablet Take 20 mg by mouth as needed. For tachacardia    [provider]  rosuvastatin (CRESTOR) 20 MG tablet Take 40 mg by mouth every evening.    [provider]  topiramate (TOPAMAX) 25 MG tablet  10/31/21   [provider]  traMADol (ULTRAM) 50 MG tablet Take 50 mg by mouth every 6 (six) hours as needed. 07/13/21   [provider]      Allergies    Metronidazole, Sulfamethoxazole-trimethoprim, and Cefdinir    Review of Systems   Review of Systems  Physical Exam Updated Vital Signs BP (!) 100/51   Pulse (!) 58   Temp 98.2 F (36.8 C) (Oral)   Resp 13   Ht _0  (1.676 m)   Wt 83.5 kg   SpO2 96%   BMI 29.70 kg/m  Physical Exam Vitals and nursing note reviewed.  Constitutional:      General: She is not in acute distress.    Appearance: She is well-developed.  HENT:     Head: Normocephalic and atraumatic.  Mouth/Throat:     Mouth: Mucous membranes are moist.  Eyes:     General: Vision grossly intact. Gaze aligned appropriately.     Extraocular Movements: Extraocular movements intact.     Conjunctiva/sclera: Conjunctivae normal.  Cardiovascular:     Rate and Rhythm: Normal rate and regular rhythm.     Pulses: Normal pulses.     Heart sounds: Normal heart sounds, S1 normal and S2 normal. No murmur heard.    No friction rub. No gallop.  Pulmonary:     Effort: Pulmonary effort is normal. No respiratory distress.     Breath sounds: Normal breath sounds.  Abdominal:     General: Bowel sounds are normal.     Palpations: Abdomen is soft.     Tenderness: There is no abdominal tenderness. There is no guarding or rebound.     Hernia:  No hernia is present.  Musculoskeletal:        General: No swelling.     Cervical back: Full passive range of motion without pain, normal range of motion and neck supple. No spinous process tenderness or muscular tenderness. Normal range of motion.     Right lower leg: No edema.     Left lower leg: No edema.  Skin:    General: Skin is warm and dry.     Capillary Refill: Capillary refill takes less than 2 seconds.     Findings: No ecchymosis, erythema, rash or wound.  Neurological:     General: No focal deficit present.     Mental Status: She is alert and oriented to person, place, and time.     GCS: GCS eye subscore is 4. GCS verbal subscore is 5. GCS motor subscore is 6.     Cranial Nerves: Cranial nerves 2-12 are intact.     Sensory: Sensation is intact.     Motor: Motor function is intact.     Coordination: Coordination is intact.  Psychiatric:        Attention and Perception: Attention normal.        Mood and Affect: Mood normal.        Speech: Speech normal.        Behavior: Behavior normal.     ED Results / Procedures / Treatments   Labs (all labs ordered are listed, but only abnormal results are displayed) Labs Reviewed  BASIC METABOLIC PANEL - Abnormal; Notable for the following components:      Result Value   Glucose, Bld 119 (*)    All other components within normal limits  CBC - Abnormal; Notable for the following components:   RBC 3.70 (*)    All other components within normal limits  D-DIMER, QUANTITATIVE  TROPONIN I (HIGH SENSITIVITY)  TROPONIN I (HIGH SENSITIVITY)    EKG EKG Interpretation  Date/Time:  Friday July 12 2022 04:06:14 EDT Ventricular Rate:  61 PR Interval:  170 QRS Duration: 80 QT Interval:  415 QTC Calculation: 418 R Axis:   77 Text Interpretation: Sinus rhythm Abnormal R-wave progression, early transition Borderline ST elevation, inferior leads No significant change since last tracing Confirmed by Orpah Greek [phone]) on  07/12/2022 4:12:32 AM  Radiology CT CORONARY FRACTIONAL FLOW RESERVE DATA PREP  Result Date: 08/01/2022 EXAM: FFRCT ANALYSIS for abnormal coronary CTA FINDINGS: FFRct analysis was performed on the original cardiac CT angiogram dataset. Diagrammatic representation of the FFRct analysis is provided in a separate PDF document in PACS. This dictation was created using the PDF document and an interactive  3D model of the results. 3D model is not available in the EMR/PACS. Normal FFR range is >0.80. 1. Left Main: FFR CT 0.99 2. LAD: FFR CT 0.95 proximal, 0.89 mid, 0.82 distal 3. LCX: FFR CT 0.93 4. Ramus: FFR CT 0.97 5. RCA: FFR CT 0.9 IMPRESSION: 1.  FFR CT consistent with nonobstructive disease. 2. Recommend aggressive risk factor modification, including LDL goal <70. Tiffany C. Oval Linsey, MD Electronically Signed   By: Skeet Latch M.D.   On: 08/01/2022 16:14   CT CORONARY MORPH W/CTA COR W/SCORE W/CA W/CM &/OR WO/CM  Result Date: 08/01/2022 CLINICAL DATA:  56F with chest pain. EXAM: Cardiac/Coronary  CT TECHNIQUE: The patient was scanned on a [employer]. FINDINGS: A 120 kV prospective scan was triggered in the descending thoracic aorta at 111 HU's. Axial non-contrast 3 mm slices were carried out through the heart. The data set was analyzed on a dedicated work station and scored using the [place]. Gantry rotation speed was 250 msecs and collimation was .6 mm. No beta blockade and 0.8 mg of sl NTG was given. The 3D data set was reconstructed in 5% intervals of the 67-82 % of the R-R cycle. Diastolic phases were analyzed on a dedicated work station using MPR, MIP and VRT modes. The patient received 80 cc of contrast. Aorta: Normal size.  Aortic atherosclerosis.  No dissection. Aortic Valve:  Trileaflet.  No calcifications. Coronary Arteries:  Normal coronary origin.  Right dominance. RCA is a large dominant artery that gives rise to PDA and PLVB. There is moderate (50-69%) mixed plaque  proximally. Left main is a large artery that gives rise to LAD, RI, and LCX arteries. It has minimal (< 25%) calcified plaque. LAD is a large vessel that has minimal (< 25%) calcified plaque in the proximal and mid vessel. RI has no plaque. LCX is a non-dominant artery that has less than 25% calcified plaque at the ostium. Gives rise to one large OM1 branch. There is no plaque. Coronary Calcium Score: Left main: 33.4 Left anterior descending artery: 194 Left circumflex artery: 30.5 Right coronary artery: 93.4 Total: 251 Percentile: 88th Other findings: Normal pulmonary vein drainage into the left atrium. Normal let atrial appendage without a thrombus. Normal size of the pulmonary artery. IMPRESSION: 1. Coronary calcium score of 251. This was 88th percentile for age-, race-, and sex-matched controls. 2. Normal coronary origin with right dominance. 3. There is moderate (50-69%) plaque in the RCA. There is minimal plaque in the left main, left circumflex, and LAD. CAD rads 3. 4.  Will send study for FFR CT. 5.  Aortic atherosclerosis. Skeet Latch, MD Electronically Signed   By: Skeet Latch M.D.   On: 08/01/2022 16:12    Procedures Procedures    Medications Ordered in ED Medications  ondansetron (ZOFRAN) injection 4 mg (4 mg Intravenous Given 07/12/22 0416)  morphine (PF) 4 MG/ML injection 4 mg (4 mg Intravenous Given 07/12/22 0416)  diphenhydrAMINE (BENADRYL) injection 25 mg (25 mg Intravenous Given 07/12/22 0429)    ED Course/ Medical Decision Making/ A&P                           Medical Decision Making Amount and/or Complexity of Data Reviewed Labs: ordered. Radiology: ordered.  Risk Prescription drug management.   Presents with chest pain.  Patient without known history of CAD.  EKG nonspecific but unchanged from prior.  Troponin negative.  Patient signed out to oncoming ER  physician at shift change to follow-up on serial troponins.        Final Clinical Impression(s) / ED  Diagnoses Final diagnoses:  Chest pain, unspecified type  Heart palpitations    Rx / DC Orders ED Discharge Orders          Ordered    Ambulatory referral to Cardiology        07/12/22 0717              Orpah Greek, MD 08/02/22 [phone]

## 2022-08-02 NOTE — Telephone Encounter (Signed)
Received call from patient who was inquiring about Echo results/CCTA results. Advised patient that these have not been reviewed yet by MD but as soon as reviewed will call patient with results.    Patient aware and verbalized understanding.

## 2022-08-05 NOTE — Telephone Encounter (Signed)
Patient calling back for update. Please advise  

## 2022-08-06 MED ORDER — ASPIRIN 81 MG PO TBEC: 81.0000 mg | DELAYED_RELEASE_TABLET | Freq: Every day | ORAL | 3 refills | Status: AC

## 2022-08-06 NOTE — Telephone Encounter (Signed)
Patient is calling back concerned with reaching Jenna due to being anxious about her results.

## 2022-08-06 NOTE — Telephone Encounter (Signed)
Janina Mayo, MD      Salley Scarlet Eliezer Lofts, please let Mrs Laird know that she did have plaque in her arteries. She does not have blockages though. No indication for a stent. Please continue crestor 40 mg daily. Please start aspirin 81 mg daily. Please obtain fasting lipid panel.    Spoke with patient regarding the following results. Patient made aware and patient verbalized understanding.   Lipid panel ordered. Patient states she only takes '20mg'$  of crestor and not 40. Patient will have lipid panel done tomorrow and go from there about increasing medication after results are reviewed by MD.   Patient also inquiring about Echo- patient had this completed on 10/24- results are not shown in chart- only images. Will forward to MD to see if this can be reviewed. Patient aware of all instructions and verbalized understanding.

## 2022-08-09 ENCOUNTER — Telehealth: Payer: Self-pay

## 2022-08-09 DIAGNOSIS — R931 Abnormal findings on diagnostic imaging of heart and coronary circulation: Secondary | ICD-10-CM | POA: Diagnosis not present

## 2022-08-09 LAB — ECHOCARDIOGRAM COMPLETE
Area-P 1/2: 3.24 cm2
P 1/2 time: 447 msec
S' Lateral: 2.8 cm

## 2022-08-09 NOTE — Telephone Encounter (Signed)
Called and spoke with patient regarding the following message from Dr. Harl Bowie- reviewed echo results with patient.   Patient also states that she takes Propanolol '20mg'$  once daily in the mornings. Patient states that her BP/HR have been in normal range. Patient states that her PCP had put her on this for tachycardia/migraines.   Patient would like to know if she should continue on the propanolol '20mg'$  daily or if Dr. Harl Bowie had other recommendations regarding a different beta blocker- advised patient I would forward to Dr. Harl Bowie for review. Patient verbalized understanding.

## 2022-08-09 NOTE — Telephone Encounter (Signed)
-----   Message from Janina Mayo, MD sent at 08/07/2022  3:34 PM EDT ----- Regarding: echo Will send another note with the results. Her echo showed normal cardiac function. There is a mild leak in the aortic valve, related to wear and tear. No changes in medications recommended

## 2022-08-10 LAB — LIPID PANEL
Chol/HDL Ratio: 3.1 ratio (ref 0.0–4.4)
Cholesterol, Total: 173 mg/dL (ref 100–199)
HDL: 55 mg/dL (ref >39)
LDL Chol Calc (NIH): 100 mg/dL — ABNORMAL HIGH (ref 0–99)
Triglycerides: 102 mg/dL (ref 0–149)
VLDL Cholesterol Cal: 18 mg/dL (ref 5–40)

## 2022-08-14 ENCOUNTER — Other Ambulatory Visit: Payer: Self-pay | Admitting: *Deleted

## 2022-08-14 ENCOUNTER — Telehealth: Payer: Self-pay | Admitting: *Deleted

## 2022-08-14 DIAGNOSIS — R931 Abnormal findings on diagnostic imaging of heart and coronary circulation: Secondary | ICD-10-CM

## 2022-08-14 MED ORDER — ROSUVASTATIN CALCIUM 20 MG PO TABS: 20.0000 mg | ORAL_TABLET | Freq: Every evening | ORAL | 3 refills | Status: DC

## 2022-08-14 MED ORDER — EZETIMIBE 10 MG PO TABS: 10.0000 mg | ORAL_TABLET | Freq: Every day | ORAL | 3 refills | Status: DC

## 2022-08-14 NOTE — Telephone Encounter (Signed)
-----   Message from Sandra Mayo, MD sent at 08/10/2022  9:12 AM EDT ----- Jacqualine Mau, please let Mr Voytko know that her bad cholesterol , LDL, is above the desired range to lower her risk of CVD. Please add zetia 10 mg daily

## 2022-09-25 DIAGNOSIS — G43009 Migraine without aura, not intractable, without status migrainosus: Secondary | ICD-10-CM | POA: Diagnosis not present

## 2022-09-25 DIAGNOSIS — I700 Atherosclerosis of aorta: Secondary | ICD-10-CM | POA: Diagnosis not present

## 2022-09-25 DIAGNOSIS — M13872 Other specified arthritis, left ankle and foot: Secondary | ICD-10-CM | POA: Diagnosis not present

## 2022-09-25 DIAGNOSIS — Z0001 Encounter for general adult medical examination with abnormal findings: Secondary | ICD-10-CM | POA: Diagnosis not present

## 2022-09-25 DIAGNOSIS — M19072 Primary osteoarthritis, left ankle and foot: Secondary | ICD-10-CM | POA: Diagnosis not present

## 2022-09-25 DIAGNOSIS — I2510 Atherosclerotic heart disease of native coronary artery without angina pectoris: Secondary | ICD-10-CM | POA: Diagnosis not present

## 2022-09-25 DIAGNOSIS — E782 Mixed hyperlipidemia: Secondary | ICD-10-CM | POA: Diagnosis not present

## 2022-09-25 DIAGNOSIS — F17210 Nicotine dependence, cigarettes, uncomplicated: Secondary | ICD-10-CM | POA: Diagnosis not present

## 2022-09-25 DIAGNOSIS — M5136 Other intervertebral disc degeneration, lumbar region: Secondary | ICD-10-CM | POA: Diagnosis not present

## 2022-09-30 ENCOUNTER — Emergency Department (HOSPITAL_BASED_OUTPATIENT_CLINIC_OR_DEPARTMENT_OTHER): Payer: Medicare Other | Admitting: Radiology

## 2022-09-30 ENCOUNTER — Emergency Department (HOSPITAL_BASED_OUTPATIENT_CLINIC_OR_DEPARTMENT_OTHER)
Admission: EM | Admit: 2022-09-30 | Discharge: 2022-09-30 | Disposition: A | Discharge status: Home / Self Care | Payer: Medicare Other | Attending: Emergency Medicine | Admitting: Emergency Medicine

## 2022-09-30 ENCOUNTER — Other Ambulatory Visit: Payer: Self-pay

## 2022-09-30 ENCOUNTER — Encounter (HOSPITAL_BASED_OUTPATIENT_CLINIC_OR_DEPARTMENT_OTHER): Payer: Self-pay

## 2022-09-30 DIAGNOSIS — Y92009 Unspecified place in unspecified non-institutional (private) residence as the place of occurrence of the external cause: Secondary | ICD-10-CM | POA: Insufficient documentation

## 2022-09-30 DIAGNOSIS — M25511 Pain in right shoulder: Secondary | ICD-10-CM | POA: Insufficient documentation

## 2022-09-30 DIAGNOSIS — W010XXA Fall on same level from slipping, tripping and stumbling without subsequent striking against object, initial encounter: Secondary | ICD-10-CM | POA: Insufficient documentation

## 2022-09-30 DIAGNOSIS — Z7982 Long term (current) use of aspirin: Secondary | ICD-10-CM | POA: Diagnosis not present

## 2022-09-30 DIAGNOSIS — W19XXXA Unspecified fall, initial encounter: Secondary | ICD-10-CM

## 2022-09-30 DIAGNOSIS — S4991XA Unspecified injury of right shoulder and upper arm, initial encounter: Secondary | ICD-10-CM | POA: Diagnosis present

## 2022-09-30 MED ORDER — CYCLOBENZAPRINE HCL 10 MG PO TABS: 10.0000 mg | ORAL_TABLET | Freq: Two times a day (BID) | ORAL | 0 refills | Status: DC | PRN

## 2022-09-30 MED ORDER — KETOROLAC TROMETHAMINE 30 MG/ML IJ SOLN
30.0000 mg | Freq: Once | INTRAMUSCULAR | Status: AC
  Administered 2022-09-30: 30 mg via INTRAMUSCULAR
  Filled 2022-09-30: qty 1

## 2022-09-30 NOTE — ED Provider Notes (Signed)
[facility] EMERGENCY DEPT Provider Note   CSN: [id number] Arrival date & time: 09/30/22  1310     History Chief Complaint  Patient presents with   Shoulder Injury    Sandra Conrad is a 66 y.o. female.   Shoulder Injury  Patient presented to the emergency department following a mechanical fall onto her right shoulder.  She reports that she tripped over stepstool in her home and landed directly on her right shoulder.  She denies hearing or feeling any popping sounds or any grinding sensation after this injury.  Previously had a shoulder dislocation approximately 20 years ago.  Complaining primarily of pain in right shoulder with some weakness.  Denies loss of sensation or motor function.  Denies any head trauma during injury or loss of consciousness.     Home Medications Prior to Admission medications   Medication Sig Start Date End Date Taking? Authorizing Provider  cyclobenzaprine (FLEXERIL) 10 MG tablet Take 1 tablet (10 mg total) by mouth 2 (two) times daily as needed for muscle spasms. 09/30/22  Yes Zelaya, Oscar A, PA-C  albuterol (VENTOLIN HFA) 108 (90 Base) MCG/ACT inhaler Inhale into the lungs every 6 (six) hours as needed for wheezing or shortness of breath.    [provider]  aspirin EC 81 MG tablet Take 1 tablet (81 mg total) by mouth daily. Swallow whole. 08/06/22   Branch, Mary E, MD  Cholecalciferol 1.25 MG (50000 UT) capsule Take 50,000 Units by mouth daily. Sunday    [provider]  ciprofloxacin (CIPRO) 500 MG tablet Take 1 tablet (500 mg total) by mouth 2 (two) times daily. 07/25/22   Pyrtle, Jay M, MD  clonazePAM (KLONOPIN) 1 MG tablet Take 1 mg by mouth 2 (two) times daily.    [provider]  dicyclomine (BENTYL) 20 MG tablet Take 1 tablet (20 mg total) by mouth 3 (three) times daily as needed for spasms. 10/09/21   Pyrtle, Jay M, MD  escitalopram (LEXAPRO) 10 MG tablet Take 10 mg by mouth daily.    [provider]  ezetimibe (ZETIA) 10 MG tablet Take 1 tablet (10 mg total) by mouth daily. 08/14/22   Branch, Mary E, MD  [place] COVID-19 [place] HOME TEST KIT TEST AS DIRECTED TODAY Patient not taking: Reported on 07/18/2022 08/21/21   [provider]  lamoTRIgine (LAMICTAL) 150 MG tablet Take 150 mg by mouth daily.    [provider]  metoprolol tartrate (LOPRESSOR) 50 MG tablet Take 1 tablet (50 mg total) by mouth once for 1 dose. PLEASE TAKE METOPROLOL 2  HOURS PRIOR TO CTA SCAN. 07/18/22 07/18/22  Branch, Mary E, MD  nitrofurantoin, macrocrystal-monohydrate, (MACROBID) 100 MG capsule Take 100 mg by mouth 2 (two) times daily. For 7 days    [provider]  omeprazole (PRILOSEC) 20 MG capsule Take 20 mg by mouth as needed.    [provider]  OVER THE COUNTER MEDICATION Vitamin d 1000 units daily    [provider]  PFIZER COVID-19 VAC BIVALENT injection  07/16/21   [provider]  propranolol (INDERAL) 20 MG tablet Take 20 mg by mouth as needed. For tachacardia    [provider]  rosuvastatin (CRESTOR) 20 MG tablet Take 1 tablet (20 mg total) by mouth every evening. 08/14/22   Branch, Mary E, MD  topiramate (TOPAMAX) 25 MG tablet  10/31/21   [provider]  traMADol (ULTRAM) 50 MG tablet Take 50 mg by mouth every 6 (six) hours   as needed. 07/13/21   [provider]      Allergies    Metronidazole, Sulfamethoxazole-trimethoprim, Cefdinir, and Morphine    Review of Systems   Review of Systems  Constitutional:  Negative for fatigue.  Musculoskeletal:  Positive for arthralgias. Negative for joint swelling, neck pain and neck stiffness.  Skin:  Negative for color change and wound.  Neurological:  Positive for weakness. Negative for numbness.  All other systems reviewed and are negative.   Physical Exam Updated Vital Signs BP 106/72 (BP Location: Right Arm)   Pulse 80   Temp 98.6 F (37 C) (Oral)   Resp 16   Ht 5' 6"  (1.676 m)   Wt 81.2 kg   SpO2 98%   BMI 28.89 kg/m  Physical Exam Vitals and nursing note reviewed.  Constitutional:      Appearance: Normal appearance.  HENT:     Head: Normocephalic and atraumatic.  Eyes:     Conjunctiva/sclera: Conjunctivae normal.  Cardiovascular:     Rate and Rhythm: Normal rate and regular rhythm.     Pulses: Normal pulses.     Heart sounds: Normal heart sounds.  Pulmonary:     Effort: Pulmonary effort is normal. No respiratory distress.     Breath sounds: Normal breath sounds.  Chest:     Chest wall: No tenderness.  Abdominal:     General: Abdomen is flat. Bowel sounds are normal.  Musculoskeletal:        General: Tenderness and signs of injury present. No swelling or deformity.     Comments: Normal passive range of motion in right shoulder.  Pain on resisted shoulder abduction and abduction as well as external and internal rotation.  Negative empty can, lift off, or Neer's.  Skin:    General: Skin is warm and dry.     Capillary Refill: Capillary refill takes less than 2 seconds.     Findings: No bruising, erythema, lesion or rash.  Neurological:     General: No focal deficit present.     Mental Status: She is alert. Mental status is at baseline.     ED Results / Procedures / Treatments   Labs (all labs ordered are listed, but only abnormal results are displayed) Labs Reviewed - No data to display  EKG None  Radiology DG Shoulder Right  Result Date: 09/30/2022 CLINICAL DATA:  Tripped and fell this morning.  Right shoulder pain. EXAM: RIGHT SHOULDER - 2+ VIEW COMPARISON:  None Available. FINDINGS: No fracture.  No bone lesion. Glenohumeral and AC joints are normally spaced and aligned. Soft tissues are unremarkable. IMPRESSION: Negative. Electronically Signed   By: David  Ormond M.D.   On: 09/30/2022 13:57    Procedures Procedures   Medications Ordered in ED Medications  ketorolac (TORADOL) 30 MG/ML injection 30 mg (30 mg Intramuscular  Given 09/30/22 1531)    ED Course/ Medical Decision Making/ A&P                           Medical Decision Making Amount and/or Complexity of Data Reviewed Radiology: ordered.   This patient presents to the ED for concern of right shoulder pain.  Differential diagnosis includes shoulder dislocation, rotator cuff injury, humeral fracture, ACS, cervical radiculopathy   Imaging Studies ordered:  I ordered imaging studies including x-ray of right shoulder I independently visualized and interpreted imaging which showed no evidence of acute fracture or dislocation I agree with the radiologist interpretation     Medicines ordered and prescription drug management:  I ordered medication including Toradol for pain Reevaluation of the patient after these medicines showed that the patient improved I have reviewed the patients home medicines and have made adjustments as needed   Problem List / ED Course:  Patient presented emergency department following fall onto right shoulder.  Based on presentation shoulder x-ray was performed which is negative for any acute fractures or dislocations.  Exam was also fairly reassuring as range of motion was preserved with minimal discomfort when moved for her.  Patient did also have deficits in strength but this is most likely due to pain with movement.  Advised patient that it should take several weeks for complete resolution in pain and regain full mobility and strength in right shoulder due to nature of injury.  Advised patient to use over-the-counter anti-inflammatories for pain control as well as a prescription for Flexeril to reduce pain at night while trying to sleep.  Advised patient to follow-up with PCP.  Patient was agreeable to treatment plan verbalized understanding all return precautions.  All questions were answered at end of encounter.  Final Clinical Impression(s) / ED Diagnoses Final diagnoses:  Acute pain of right shoulder  Fall, initial  encounter    Rx / DC Orders ED Discharge Orders          Ordered    cyclobenzaprine (FLEXERIL) 10 MG tablet  2 times daily PRN        09/30/22 1525              Luvenia Heller, PA-C 09/30/22 [place], [place], DO 09/30/22 1730

## 2022-09-30 NOTE — ED Triage Notes (Signed)
Pt states she tripped this morning around 0615, falling on right shoulder.

## 2022-09-30 NOTE — Discharge Instructions (Addendum)
You were seen in the emergency department for right shoulder pain after a fall.  Based on x-ray imaging and physical exam, not acutely concern at this time for a fracture or tendon injury. However, I would follow up with your primary care provider or orthopedics for further evaluation if symptoms in right shoulder are not improving in the next 1-2 weeks.

## 2022-10-16 DIAGNOSIS — M25511 Pain in right shoulder: Secondary | ICD-10-CM | POA: Diagnosis not present

## 2022-10-23 DIAGNOSIS — M25511 Pain in right shoulder: Secondary | ICD-10-CM | POA: Diagnosis not present

## 2022-12-25 DIAGNOSIS — Z96642 Presence of left artificial hip joint: Secondary | ICD-10-CM | POA: Diagnosis not present

## 2022-12-25 DIAGNOSIS — F17210 Nicotine dependence, cigarettes, uncomplicated: Secondary | ICD-10-CM | POA: Diagnosis not present

## 2022-12-25 DIAGNOSIS — G43009 Migraine without aura, not intractable, without status migrainosus: Secondary | ICD-10-CM | POA: Diagnosis not present

## 2022-12-25 DIAGNOSIS — M5136 Other intervertebral disc degeneration, lumbar region: Secondary | ICD-10-CM | POA: Diagnosis not present

## 2022-12-25 DIAGNOSIS — M13872 Other specified arthritis, left ankle and foot: Secondary | ICD-10-CM | POA: Diagnosis not present

## 2022-12-25 DIAGNOSIS — M19072 Primary osteoarthritis, left ankle and foot: Secondary | ICD-10-CM | POA: Diagnosis not present

## 2022-12-25 DIAGNOSIS — R000 Tachycardia, unspecified: Secondary | ICD-10-CM | POA: Diagnosis not present

## 2022-12-25 DIAGNOSIS — I2510 Atherosclerotic heart disease of native coronary artery without angina pectoris: Secondary | ICD-10-CM | POA: Diagnosis not present

## 2023-02-05 DIAGNOSIS — R931 Abnormal findings on diagnostic imaging of heart and coronary circulation: Secondary | ICD-10-CM | POA: Diagnosis not present

## 2023-02-05 DIAGNOSIS — R079 Chest pain, unspecified: Secondary | ICD-10-CM | POA: Diagnosis not present

## 2023-02-05 LAB — HEPATIC FUNCTION PANEL
ALT: 33 IU/L — ABNORMAL HIGH (ref 0–32)
AST: 27 IU/L (ref 0–40)
Albumin: 4.1 g/dL (ref 3.9–4.9)
Alkaline Phosphatase: 63 IU/L (ref 44–121)
Bilirubin Total: 0.3 mg/dL (ref 0.0–1.2)
Bilirubin, Direct: 0.14 mg/dL (ref 0.00–0.40)
Total Protein: 6.1 g/dL (ref 6.0–8.5)

## 2023-02-05 LAB — LIPID PANEL
Chol/HDL Ratio: 2.5 ratio (ref 0.0–4.4)
Cholesterol, Total: 126 mg/dL (ref 100–199)
HDL: 50 mg/dL (ref >39)
LDL Chol Calc (NIH): 58 mg/dL (ref 0–99)
Triglycerides: 92 mg/dL (ref 0–149)
VLDL Cholesterol Cal: 18 mg/dL (ref 5–40)

## 2023-02-10 ENCOUNTER — Ambulatory Visit: Payer: Medicare Other | Admitting: Internal Medicine

## 2023-03-17 ENCOUNTER — Ambulatory Visit: Payer: Medicare Other | Admitting: Internal Medicine

## 2023-03-19 ENCOUNTER — Ambulatory Visit: Payer: Medicare Other | Attending: Internal Medicine | Admitting: Internal Medicine

## 2023-03-19 VITALS — BP 119/70 | HR 65 | Ht 66.0 in | Wt 195.2 lb

## 2023-03-19 DIAGNOSIS — R079 Chest pain, unspecified: Secondary | ICD-10-CM | POA: Diagnosis not present

## 2023-03-19 NOTE — Patient Instructions (Signed)
Medication Instructions:  No changes  *If you need a refill on your cardiac medications before your next appointment, please call your pharmacy*   Lab Work: None ordered today  If you have labs (blood work) drawn today and your tests are completely normal, you will receive your results only by: MyChart Message (if you have MyChart) OR A paper copy in the mail If you have any lab test that is abnormal or we need to change your treatment, we will call you to review the results.   Testing/Procedures: None ordered today   Follow-Up: At [facility], you and your health needs are our priority.  As part of our continuing mission to provide you with exceptional heart care, we have created designated Provider Care Teams.  These Care Teams include your primary Cardiologist (physician) and Advanced Practice Providers (APPs -  Physician Assistants and Nurse Practitioners) who all work together to provide you with the care you need, when you need it.  We recommend signing up for the patient portal called "MyChart".  Sign up information is provided on this After Visit Summary.  MyChart is used to connect with patients for Virtual Visits (Telemedicine).  Patients are able to view lab/test results, encounter notes, upcoming appointments, etc.  Non-urgent messages can be sent to your provider as well.   To learn more about what you can do with MyChart, go to [email or web address].    Your next appointment:   1 year(s)  Provider:   Maisie Fus, MD

## 2023-03-19 NOTE — Progress Notes (Signed)
Cardiology Office Note:    Date:  03/19/2023   ID:  Sandra Conrad, DOB October 18, 1955, MRN [id number]  PCP:  Benita Stabile, MD   [facility] Providers Cardiologist:  Maisie Fus, MD     Referring MD: Benita Stabile, MD   No chief complaint on file. Chest pain  History of Present Illness:    Sandra Conrad is a 67 y.o. female with a hx of  R breast cancer, s/p lumpectomy (on letrozole ER+) R sided radiation, not on chemo,  , referral for CP from the ED. Troponin was negative.  ECG-NSR with early repol. Appears to be new since 2020. Xray was unremarkable  She states she was sleeping and woke up in the AM. She had muscle cramps. Had R chest pain and went to her arm acutely. In [place] she had chest pain at work. This was June 2012. Her LHC was normal. No consistent cardiology FU needed. She has been doing well from a cardiac standpoint since then.  She denies fevers chills. Her symptoms resolved. She just quit smoking, started in her 74s  Interim hx 03/19/2023 Having difficulty losing weight. Had a mechanical fall on Xmas. She has normal BP.  She denies CP.  Past Medical History:  Diagnosis Date   Abdominal pain    1 time in last 2 years ago/   Anxiety    Arthritis    Blood transfusion without reported diagnosis 2005   1 time   Cancer [facility]) 2011   Right breast IDC   Complication of anesthesia    Pt request scopolamine patch   Depression    Diarrhea    last time in 2013   Diverticulosis    Dysrhythmia    tachacardia managed by pcp    GERD (gastroesophageal reflux disease)    Hyperlipidemia    Insomnia    Lumbar herniated disc    L3-4 and L5   Lumbar nerve root impingement    right leg   Migraine    Neuropathy of foot    legs due to djd and spinal stenosis intermittent neuropathy   OA (osteoarthritis)    both hips and spine and left foot   Osteoporosis    Personal history of chemotherapy    oral/on meds for 4 years   Personal history of radiation therapy  2008   had 35 treatments/ last on 2008   PONV (postoperative nausea and vomiting)    extreme ponv uses scopolamine patch   Tubular adenoma of colon    Vitamin D deficiency    Wears glasses     Past Surgical History:  Procedure Laterality Date   BACK SURGERY  08/21/2018   L4-L5 and laminectomy   BREAST EXCISIONAL BIOPSY Right 2008   BREAST LUMPECTOMY  2008   right breast   CARDIAC CATHETERIZATION  2012   no stents/no blockages   COLONOSCOPY     DILATION AND CURETTAGE OF UTERUS  1982   HIP SURGERY Left jan 2005 and oct 2005   left hip x 2 ( pt report that first hip failed, revision was needed)   TOTAL HIP ARTHROPLASTY Right 03/31/2019   Procedure: Right Anterior Total Hip Arthroplasty;  Surgeon: Gean Birchwood, MD;  Location: [facility];  Service: Orthopedics;  Laterality: Right;   WRIST SURGERY  2007   right wrist orif plates and 12 screws still in    Current Medications: Current Outpatient Medications on File Prior to Visit  Medication Sig Dispense Refill  aspirin EC 81 MG tablet Take 1 tablet (81 mg total) by mouth daily. Swallow whole. 90 tablet 3   Cholecalciferol 1.25 MG (50000 UT) capsule Take 50,000 Units by mouth daily. Sunday     clonazePAM (KLONOPIN) 1 MG tablet Take 1 mg by mouth 2 (two) times daily.     dicyclomine (BENTYL) 20 MG tablet Take 1 tablet (20 mg total) by mouth 3 (three) times daily as needed for spasms. 90 tablet 1   escitalopram (LEXAPRO) 10 MG tablet Take 10 mg by mouth daily.     ezetimibe (ZETIA) 10 MG tablet Take 1 tablet (10 mg total) by mouth daily. 90 tablet 3   lamoTRIgine (LAMICTAL) 150 MG tablet Take 150 mg by mouth daily.     propranolol (INDERAL) 20 MG tablet Take 20 mg by mouth as needed. For tachacardia     rosuvastatin (CRESTOR) 20 MG tablet Take 1 tablet (20 mg total) by mouth every evening. 90 tablet 3   traMADol (ULTRAM) 50 MG tablet Take 50 mg by mouth every 6 (six) hours as needed.     albuterol (VENTOLIN HFA) 108 (90 Base) MCG/ACT  inhaler Inhale into the lungs every 6 (six) hours as needed for wheezing or shortness of breath.     ciprofloxacin (CIPRO) 500 MG tablet Take 1 tablet (500 mg total) by mouth 2 (two) times daily. 14 tablet 0   cyclobenzaprine (FLEXERIL) 10 MG tablet Take 1 tablet (10 mg total) by mouth 2 (two) times daily as needed for muscle spasms. (Patient not taking: Reported on 03/19/2023) 20 tablet 0   FLOWFLEX COVID-19 AG HOME TEST KIT TEST AS DIRECTED TODAY (Patient not taking: Reported on 03/19/2023)     metoprolol tartrate (LOPRESSOR) 50 MG tablet Take 1 tablet (50 mg total) by mouth once for 1 dose. PLEASE TAKE METOPROLOL 2  HOURS PRIOR TO CTA SCAN. 1 tablet 0   nitrofurantoin, macrocrystal-monohydrate, (MACROBID) 100 MG capsule Take 100 mg by mouth 2 (two) times daily. For 7 days     omeprazole (PRILOSEC) 20 MG capsule Take 20 mg by mouth as needed.     OVER THE COUNTER MEDICATION Vitamin d 1000 units daily     PFIZER COVID-19 VAC BIVALENT injection      topiramate (TOPAMAX) 25 MG tablet      No current facility-administered medications on file prior to visit.    Allergies:   Metronidazole, Sulfamethoxazole-trimethoprim, Cefdinir, and Morphine   Social History   Socioeconomic History   Marital status: Divorced    Spouse name: Not on file   Number of children: Not on file   Years of education: Not on file   Highest education level: Not on file  Occupational History   Not on file  Tobacco Use   Smoking status: Every Day    Packs/day: 0.50    Years: 30.00    Additional pack years: 0.00    Total pack years: 15.00    Types: Cigarettes    Last attempt to quit: 01/05/2021    Years since quitting: 2.2   Smokeless tobacco: Never   Tobacco comments:    smokes 10 cigarettes a day  Vaping Use   Vaping Use: Never used  Substance and Sexual Activity   Alcohol use: No   Drug use: Not Currently   Sexual activity: Not Currently    Birth control/protection: Post-menopausal  Other Topics Concern    Not on file  Social History Narrative   Not on file   Social Determinants  of Health   Financial Resource Strain: Not on file  Food Insecurity: Not on file  Transportation Needs: Not on file  Physical Activity: Not on file  Stress: Not on file  Social Connections: Not on file     Family History: The patient's family history includes Bipolar disorder in her sister; Colon cancer in her mother; Colon polyps in her brother and sister; Heart attack in her father; Heart disease in her brother; Transient ischemic attack in her brother. There is no history of Esophageal cancer, Stomach cancer, or Rectal cancer. Father died of MI when she was young. Brother - MI at age 64, CABG 2005 , stroke.   ROS:   Please see the history of present illness.     All other systems reviewed and are negative.  EKGs/Labs/Other Studies Reviewed:    The following studies were reviewed today:  CT coronary morph 08/01/2022 Coronary Calcium Score: Left main: 33.4 Left anterior descending artery: 194 Left circumflex artery: 30.5 Right coronary artery: 93.4 Total: 251 Percentile: 88th Other findings: Normal pulmonary vein drainage into the left atrium. Normal let atrial appendage without a thrombus. Normal size of the pulmonary artery. IMPRESSION: 1. Coronary calcium score of 251. This was 88th percentile for age-, race-, and sex-matched controls.  2. Normal coronary origin with right dominance. 3. There is moderate (50-69%) plaque in the RCA. There is minimal plaque in the left main, left circumflex, and LAD. CAD rads 3.  TTE 07/30/2022 LVEF and RV is normal. No valve dx. No pericardial effusion  EKG:  EKG is  ordered today.  The ekg ordered today demonstrates   07/18/2022- NSR  03/19/2023- NSR  Recent Labs: 07/12/2022: Hemoglobin 12.2; Platelets 315 07/18/2022: BUN 15; Creatinine, Ser 0.81; Potassium 4.4; Sodium 137 02/05/2023: ALT 33   Recent Lipid Panel    Component Value Date/Time   CHOL  126 02/05/2023 0824   TRIG 92 02/05/2023 0824   HDL 50 02/05/2023 0824   CHOLHDL 2.5 02/05/2023 0824   LDLCALC 58 02/05/2023 0824     Risk Assessment/Calculations:     Physical Exam:    VS:   Vitals:   03/19/23 1346  BP: 119/70  Pulse: 65  SpO2: 96%     Wt Readings from Last 3 Encounters:  03/19/23 195 lb 3.2 oz (88.5 kg)  09/30/22 179 lb (81.2 kg)  07/18/22 188 lb 6.4 oz (85.5 kg)     GEN:  Well nourished, well developed in no acute distress HEENT: Normal NECK: No JVD; No carotid bruits LYMPHATICS: No lymphadenopathy CARDIAC: RRR, no murmurs, rubs, gallops RESPIRATORY:  Clear to auscultation without rales, wheezing or rhonchi  ABDOMEN: Soft, non-tender, non-distended MUSCULOSKELETAL:  No edema; No deformity  SKIN: Warm and dry NEUROLOGIC:  Alert and oriented x 3 PSYCHIATRIC:  Normal affect   ASSESSMENT:   Elevated CAC: Coronary CTA showed elevated CAC.  FFR negative. Recommend continued lifestyle modifications including healthy diet, lipid management, and exercise. LDL goal < 70 mg/dL.  LDL 58 mg/dL. Blood pressure is well controlled. EKG is normal. She is asymptomatic. No changes today.  PLAN:    In order of problems listed above:  Follow up one year     Medication Adjustments/Labs and Tests Ordered: Current medicines are reviewed at length with the patient today.  Concerns regarding medicines are outlined above.  Orders Placed This Encounter  Procedures   EKG 12-Lead   No orders of the defined types were placed in this encounter.   Patient Instructions  Medication  Instructions:  No changes  *If you need a refill on your cardiac medications before your next appointment, please call your pharmacy*   Lab Work: None ordered today  If you have labs (blood work) drawn today and your tests are completely normal, you will receive your results only by: MyChart Message (if you have MyChart) OR A paper copy in the mail If you have any lab test that is  abnormal or we need to change your treatment, we will call you to review the results.   Testing/Procedures: None ordered today   Follow-Up: At [facility], you and your health needs are our priority.  As part of our continuing mission to provide you with exceptional heart care, we have created designated Provider Care Teams.  These Care Teams include your primary Cardiologist (physician) and Advanced Practice Providers (APPs -  Physician Assistants and Nurse Practitioners) who all work together to provide you with the care you need, when you need it.  We recommend signing up for the patient portal called "MyChart".  Sign up information is provided on this After Visit Summary.  MyChart is used to connect with patients for Virtual Visits (Telemedicine).  Patients are able to view lab/test results, encounter notes, upcoming appointments, etc.  Non-urgent messages can be sent to your provider as well.   To learn more about what you can do with MyChart, go to [email or web address].    Your next appointment:   1 year(s)  Provider:   Maisie Fus, MD       Signed, Maisie Fus, MD  03/19/2023 2:15 PM    [facility]

## 2023-04-03 ENCOUNTER — Emergency Department (HOSPITAL_BASED_OUTPATIENT_CLINIC_OR_DEPARTMENT_OTHER): Payer: Medicare Other | Admitting: Radiology

## 2023-04-03 ENCOUNTER — Other Ambulatory Visit: Payer: Self-pay

## 2023-04-03 ENCOUNTER — Encounter (HOSPITAL_BASED_OUTPATIENT_CLINIC_OR_DEPARTMENT_OTHER): Payer: Self-pay

## 2023-04-03 ENCOUNTER — Emergency Department (HOSPITAL_BASED_OUTPATIENT_CLINIC_OR_DEPARTMENT_OTHER)
Admission: EM | Admit: 2023-04-03 | Discharge: 2023-04-03 | Disposition: A | Discharge status: Home / Self Care | Payer: Medicare Other | Attending: Emergency Medicine | Admitting: Emergency Medicine

## 2023-04-03 DIAGNOSIS — Z7982 Long term (current) use of aspirin: Secondary | ICD-10-CM | POA: Insufficient documentation

## 2023-04-03 DIAGNOSIS — Z853 Personal history of malignant neoplasm of breast: Secondary | ICD-10-CM | POA: Diagnosis not present

## 2023-04-03 DIAGNOSIS — R059 Cough, unspecified: Secondary | ICD-10-CM | POA: Diagnosis present

## 2023-04-03 DIAGNOSIS — J40 Bronchitis, not specified as acute or chronic: Secondary | ICD-10-CM | POA: Diagnosis not present

## 2023-04-03 DIAGNOSIS — I2510 Atherosclerotic heart disease of native coronary artery without angina pectoris: Secondary | ICD-10-CM | POA: Insufficient documentation

## 2023-04-03 DIAGNOSIS — E782 Mixed hyperlipidemia: Secondary | ICD-10-CM | POA: Diagnosis not present

## 2023-04-03 DIAGNOSIS — E559 Vitamin D deficiency, unspecified: Secondary | ICD-10-CM | POA: Diagnosis not present

## 2023-04-03 DIAGNOSIS — R7303 Prediabetes: Secondary | ICD-10-CM | POA: Diagnosis not present

## 2023-04-03 DIAGNOSIS — R918 Other nonspecific abnormal finding of lung field: Secondary | ICD-10-CM | POA: Diagnosis not present

## 2023-04-03 MED ORDER — HYDROCODONE BIT-HOMATROP MBR 5-1.5 MG/5ML PO SOLN: 5.0000 mL | Freq: Four times a day (QID) | ORAL | 0 refills | Status: DC | PRN

## 2023-04-03 MED ORDER — ALBUTEROL SULFATE HFA 108 (90 BASE) MCG/ACT IN AERS: 2.0000 | INHALATION_SPRAY | RESPIRATORY_TRACT | Status: DC | PRN

## 2023-04-03 MED ORDER — IPRATROPIUM-ALBUTEROL 0.5-2.5 (3) MG/3ML IN SOLN
3.0000 mL | Freq: Once | RESPIRATORY_TRACT | Status: AC
  Administered 2023-04-03: 3 mL via RESPIRATORY_TRACT
  Filled 2023-04-03: qty 3

## 2023-04-03 NOTE — ED Triage Notes (Signed)
Reports cough x 3-4days.  Reports tried OTC meds with no relief.  Reports exertional dyspnea and audible wheezing.  Denies hx of heart failure but does have hx of CAD PNA.  No dx of COPD but hx of smoking. Denies fever. Reports intermittent yellow sputum.

## 2023-04-03 NOTE — Discharge Instructions (Signed)
Please read and follow all provided instructions.  Your diagnoses today include:  1. Bronchitis    Tests performed today include: Chest x-ray - does not show pneumonia Vital signs. See below for your results today.   Medications prescribed:  Hydromet - narcotic cough suppressant syrup  You have been prescribed narcotic cough suppressant such as Tussinex: DO NOT drive or perform any activities that require you to be awake and alert because this medicine can make you drowsy.   Albuterol inhaler - medication that opens up your airway  Use inhaler as follows: 1-2 puffs with spacer every 4 hours as needed for wheezing, cough, or shortness of breath.   Take any prescribed medications only as directed.  Home care instructions:  Follow any educational materials contained in this packet.  Follow-up instructions: Please follow-up with your primary care provider in the next 3 days for further evaluation of your symptoms and a recheck if you are not feeling better.   Return instructions:  Please return to the Emergency Department if you experience worsening symptoms. Please return with worsening wheezing, shortness of breath, or difficulty breathing. Return with persistent fever above 101F.  Please return if you have any other emergent concerns.  Additional Information:  Your vital signs today were: BP 116/73 (BP Location: Left Arm)   Pulse 64   Temp 98.3 F (36.8 C) (Oral)   Resp 20   Ht 5\' 6"  (1.676 m)   Wt 88.5 kg   SpO2 99%   BMI 31.47 kg/m  If your blood pressure (BP) was elevated above 135/85 this visit, please have this repeated by your doctor within one month. --------------

## 2023-04-03 NOTE — ED Notes (Signed)
Pt discharged to home, NAD noted 

## 2023-04-03 NOTE — ED Provider Notes (Signed)
[place] EMERGENCY DEPARTMENT AT [facility] Provider Note   CSN: [id number] Arrival date & time: 04/03/23  1150     History  Chief Complaint  Patient presents with   Cough    Sandra Conrad is a 67 y.o. female.  Patient with history of breast cancer and non-obstructive CAD presents to the ED with cough for 4 days. Patient reports SOB with exertion and wheezing.  Patient states her cough is "constant" and productive with "yellow" sputum. Patient states she has tried 4 different OTC cough medicine with no relief. Patient denies history of COPD, asthma or HF. Patient says she smokes 5 cigarettes/day at current. Patient denies chest pain, fever, chills, abdominal pain, nasal congestion, leg pain/swelling, bowel or urinary changes. Patient states she hasn't been around anyone sick and had 2x negative home COVID tests yesterday. Patient reports she received her pneumococcal vaccination. Patient has an appointment with her PCP for her annual exam in 4 days.        Home Medications Prior to Admission medications   Medication Sig Start Date End Date Taking? Authorizing Provider  albuterol (VENTOLIN HFA) 108 (90 Base) MCG/ACT inhaler Inhale into the lungs every 6 (six) hours as needed for wheezing or shortness of breath.    [provider]  aspirin EC 81 MG tablet Take 1 tablet (81 mg total) by mouth daily. Swallow whole. 08/06/22   Maisie Fus, MD  Cholecalciferol 1.25 MG (50000 UT) capsule Take 50,000 Units by mouth daily. Sunday    [provider]  ciprofloxacin (CIPRO) 500 MG tablet Take 1 tablet (500 mg total) by mouth 2 (two) times daily. 07/25/22   Pyrtle, Carie Caddy, MD  clonazePAM (KLONOPIN) 1 MG tablet Take 1 mg by mouth 2 (two) times daily.    [provider]  cyclobenzaprine (FLEXERIL) 10 MG tablet Take 1 tablet (10 mg total) by mouth 2 (two) times daily as needed for muscle spasms. Patient not taking: Reported on 03/19/2023 09/30/22   Smitty Knudsen, PA-C  dicyclomine (BENTYL) 20 MG tablet Take 1 tablet (20 mg total) by mouth 3 (three) times daily as needed for spasms. 10/09/21   Pyrtle, Carie Caddy, MD  escitalopram (LEXAPRO) 10 MG tablet Take 10 mg by mouth daily.    [provider]  ezetimibe (ZETIA) 10 MG tablet Take 1 tablet (10 mg total) by mouth daily. 08/14/22   Maisie Fus, MD  FLOWFLEX COVID-19 AG HOME TEST KIT TEST AS DIRECTED TODAY Patient not taking: Reported on 03/19/2023 08/21/21   [provider]  lamoTRIgine (LAMICTAL) 150 MG tablet Take 150 mg by mouth daily.    [provider]  metoprolol tartrate (LOPRESSOR) 50 MG tablet Take 1 tablet (50 mg total) by mouth once for 1 dose. PLEASE TAKE METOPROLOL 2  HOURS PRIOR TO CTA SCAN. 07/18/22 07/18/22  Maisie Fus, MD  nitrofurantoin, macrocrystal-monohydrate, (MACROBID) 100 MG capsule Take 100 mg by mouth 2 (two) times daily. For 7 days    [provider]  omeprazole (PRILOSEC) 20 MG capsule Take 20 mg by mouth as needed.    [provider]  OVER THE COUNTER MEDICATION Vitamin d 1000 units daily    [provider]  PFIZER COVID-19 VAC BIVALENT injection  07/16/21   [provider]  propranolol (INDERAL) 20 MG tablet Take 20 mg by mouth as needed. For tachacardia    [provider]  rosuvastatin (CRESTOR) 20 MG tablet Take 1 tablet (20 mg total)  by mouth every evening. 08/14/22   Maisie Fus, MD  topiramate (TOPAMAX) 25 MG tablet  10/31/21   [provider]  traMADol (ULTRAM) 50 MG tablet Take 50 mg by mouth every 6 (six) hours as needed. 07/13/21   [provider]      Allergies    Metronidazole, Sulfamethoxazole-trimethoprim, Cefdinir, and Morphine    Review of Systems   Review of Systems  Physical Exam Updated Vital Signs BP 116/73 (BP Location: Left Arm)   Pulse 64   Temp 98.3 F (36.8 C) (Oral)   Resp 20   Ht 5\' 6"  (1.676 m)   Wt 88.5 kg   SpO2 99%   BMI 31.47 kg/m    Physical Exam Vitals and nursing note reviewed.  Constitutional:      General: She is not in acute distress.    Appearance: She is well-developed.  HENT:     Head: Normocephalic and atraumatic.     Right Ear: External ear normal.     Left Ear: External ear normal.     Nose: Nose normal.     Mouth/Throat:     Mouth: Mucous membranes are moist.  Eyes:     Conjunctiva/sclera: Conjunctivae normal.  Cardiovascular:     Rate and Rhythm: Normal rate and regular rhythm.     Heart sounds: No murmur heard. Pulmonary:     Effort: No respiratory distress.     Breath sounds: Wheezing and rhonchi present. No rales.     Comments: Scattered rhonchi, clear with frequent coughing during exam.  Also some scattered expiratory wheezing noted.  Patient in no respiratory distress, speaking in full sentences. Abdominal:     Palpations: Abdomen is soft.     Tenderness: There is no abdominal tenderness. There is no guarding or rebound.  Musculoskeletal:     Cervical back: Normal range of motion and neck supple.     Right lower leg: No edema.     Left lower leg: No edema.  Skin:    General: Skin is warm and dry.     Findings: No rash.  Neurological:     General: No focal deficit present.     Mental Status: She is alert. Mental status is at baseline.     Motor: No weakness.  Psychiatric:        Mood and Affect: Mood normal.     ED Results / Procedures / Treatments   Labs (all labs ordered are listed, but only abnormal results are displayed) Labs Reviewed - No data to display  EKG None  Radiology DG Chest 2 View  Result Date: 04/03/2023 CLINICAL DATA:  Cough EXAM: CHEST - 2 VIEW COMPARISON:  X-ray 07/12/2022 FINDINGS: Hyperinflation. No consolidation, pneumothorax or effusion. Normal cardiopericardial silhouette without edema. Degenerative changes of the spine. IMPRESSION: Hyperinflation.  No acute cardiopulmonary disease Electronically Signed   By: Karen Kays M.D.   On: 04/03/2023 12:50     Procedures Procedures    Medications Ordered in ED Medications  albuterol (VENTOLIN HFA) 108 (90 Base) MCG/ACT inhaler 2 puff (has no administration in time range)  ipratropium-albuterol (DUONEB) 0.5-2.5 (3) MG/3ML nebulizer solution 3 mL (3 mLs Nebulization Given 04/03/23 1228)    ED Course/ Medical Decision Making/ A&P    Patient seen and examined. History obtained directly from patient. Work-up including labs, imaging, EKG ordered in triage, if performed, were reviewed.    Labs/EKG: None ordered.   Imaging: Independently visualized and interpreted.  This included: Chest x-ray, agree  no signs of acute infiltrate or other significant problems.  Medications/Fluids: Ordered: Albuterol/Atrovent.  Also discussed administration of prednisone with patient.  She would like to avoid this if at all possible given severe intolerances.   Most recent vital signs reviewed and are as follows: BP 116/73 (BP Location: Left Arm)   Pulse 64   Temp 98.3 F (36.8 C) (Oral)   Resp 20   Ht 5\' 6"  (1.676 m)   Wt 88.5 kg   SpO2 99%   BMI 31.47 kg/m   Initial impression: Bronchitis  1:36 PM Reassessment performed. Patient appears improved.  Lungs are now clear.  Imaging personally visualized and interpreted including: Agree no pneumonia, hyperinflation noted Reviewed pertinent lab work and imaging with patient at bedside. Questions answered.   Most current vital signs reviewed and are as follows: BP 116/73 (BP Location: Left Arm)   Pulse 64   Temp 98.3 F (36.8 C) (Oral)   Resp 20   Ht 5\' 6"  (1.676 m)   Wt 88.5 kg   SpO2 99%   BMI 31.47 kg/m   Plan: Discharge to home.   Prescriptions written for: Hydromet cough syrup, she has had this prescribed in past.  Discussed that she should not combine with home tramadol that she uses as needed, or with Klonopin which she takes before bed for sleep.  Patient counseled on use of narcotic cough medications. Urged not to drink alcohol, drive, or  perform any other activities that requires focus while taking these medications. The patient verbalizes understanding and agrees with the plan.  Other home care instructions discussed: Use of albuterol inhaler, 2 puffs every 4 hours for the next 1 day and then as needed  ED return instructions discussed: Fever, worsening shortness of breath, trouble breathing, new symptoms or other concerns  Follow-up instructions discussed: Patient encouraged to follow-up with their PCP in 5 days if not improving.                              Medical Decision Making Amount and/or Complexity of Data Reviewed Radiology: ordered.  Risk Prescription drug management.   Patient presents with cough and wheezing.  She does use tobacco.  Chest x-ray negative for pneumonia.  Vital signs are reassuring.  Patient with good improvement after albuterol and Atrovent.  Patient will be discharged home on albuterol.  She is also requesting cough medication and will be given a course of Hydromet cough syrup which she has having in the past.  Safety precautions discussed with patient.  Do not feel that she requires additional workup today.  Low concern for ACS, PE, pneumothorax, heart failure.  I wonder if she does have an element of COPD that is undiagnosed.  The patient's vital signs, pertinent lab work and imaging were reviewed and interpreted as discussed in the ED course. Hospitalization was considered for further testing, treatments, or serial exams/observation. However as patient is well-appearing, has a stable exam, and reassuring studies today, I do not feel that they warrant admission at this time. This plan was discussed with the patient who verbalizes agreement and comfort with this plan and seems reliable and able to return to the Emergency Department with worsening or changing symptoms.         Final Clinical Impression(s) / ED Diagnoses Final diagnoses:  Bronchitis    Rx / DC Orders ED Discharge  Orders          Ordered  HYDROcodone bit-homatropine (HYDROMET) 5-1.5 MG/5ML syrup  Every 6 hours PRN        04/03/23 1331              Renne Crigler, PA-C 04/03/23 1909    Derwood Kaplan, MD 04/04/23 0740

## 2023-04-04 ENCOUNTER — Other Ambulatory Visit: Payer: Self-pay | Admitting: Internal Medicine

## 2023-04-04 DIAGNOSIS — Z1231 Encounter for screening mammogram for malignant neoplasm of breast: Secondary | ICD-10-CM

## 2023-04-07 DIAGNOSIS — R000 Tachycardia, unspecified: Secondary | ICD-10-CM | POA: Diagnosis not present

## 2023-04-07 DIAGNOSIS — I700 Atherosclerosis of aorta: Secondary | ICD-10-CM | POA: Diagnosis not present

## 2023-04-07 DIAGNOSIS — E559 Vitamin D deficiency, unspecified: Secondary | ICD-10-CM | POA: Diagnosis not present

## 2023-04-07 DIAGNOSIS — J209 Acute bronchitis, unspecified: Secondary | ICD-10-CM | POA: Diagnosis not present

## 2023-04-07 DIAGNOSIS — M13872 Other specified arthritis, left ankle and foot: Secondary | ICD-10-CM | POA: Diagnosis not present

## 2023-04-07 DIAGNOSIS — I2510 Atherosclerotic heart disease of native coronary artery without angina pectoris: Secondary | ICD-10-CM | POA: Diagnosis not present

## 2023-04-07 DIAGNOSIS — M5136 Other intervertebral disc degeneration, lumbar region: Secondary | ICD-10-CM | POA: Diagnosis not present

## 2023-04-07 DIAGNOSIS — E782 Mixed hyperlipidemia: Secondary | ICD-10-CM | POA: Diagnosis not present

## 2023-04-07 DIAGNOSIS — G43009 Migraine without aura, not intractable, without status migrainosus: Secondary | ICD-10-CM | POA: Diagnosis not present

## 2023-04-10 ENCOUNTER — Telehealth: Payer: Medicare Other | Admitting: Physician Assistant

## 2023-04-10 DIAGNOSIS — J40 Bronchitis, not specified as acute or chronic: Secondary | ICD-10-CM | POA: Diagnosis not present

## 2023-04-10 MED ORDER — PREDNISONE 10 MG (21) PO TBPK
ORAL_TABLET | ORAL | 0 refills | Status: DC
Start: 2023-04-10 — End: 2023-06-10

## 2023-04-10 NOTE — Progress Notes (Signed)
Virtual Visit Consent   Sandra Conrad, you are scheduled for a virtual visit with a [facility] provider today. Just as with appointments in the office, your consent must be obtained to participate. Your consent will be active for this visit and any virtual visit you may have with one of our providers in the next 365 days. If you have a MyChart account, a copy of this consent can be sent to you electronically.  As this is a virtual visit, video technology does not allow for your provider to perform a traditional examination. This may limit your provider's ability to fully assess your condition. If your provider identifies any concerns that need to be evaluated in person or the need to arrange testing (such as labs, EKG, etc.), we will make arrangements to do so. Although advances in technology are sophisticated, we cannot ensure that it will always work on either your end or our end. If the connection with a video visit is poor, the visit may have to be switched to a telephone visit. With either a video or telephone visit, we are not always able to ensure that we have a secure connection.  By engaging in this virtual visit, you consent to the provision of healthcare and authorize for your insurance to be billed (if applicable) for the services provided during this visit. Depending on your insurance coverage, you may receive a charge related to this service.  I need to obtain your verbal consent now. Are you willing to proceed with your visit today? Sandra Conrad has provided verbal consent on 04/10/2023 for a virtual visit (video or telephone). Margaretann Loveless, PA-C  Date: 04/10/2023 9:59 AM  Virtual Visit via Video Note   I, Margaretann Loveless, connected with  Sandra Conrad  ([id number], December 29, 1955) on 04/10/23 at 10:15 AM EDT by a video-enabled telemedicine application and verified that I am speaking with the correct person using two identifiers.  Location: Patient: Virtual Visit Location  Patient: Home Provider: Virtual Visit Location Provider: Home Office   I discussed the limitations of evaluation and management by telemedicine and the availability of in person appointments. The patient expressed understanding and agreed to proceed.    History of Present Illness: Sandra Conrad is a 67 y.o. who identifies as a female who was assigned female at birth, and is being seen today for bronchitis. Patient was seen at ED on 04/03/23 and diagnosed with Bronchitis, had negative CXR. Was given Hydromet cough syrup and duoneb nebulizer medication. Saw PCP on 04/07/23. Was told to follow up if wheezing increased again to consider having a steroid added. Her PCP office is closed until Monday, 04/14/23. Last night could hear audible wheezing and had coughing increase.   Problems:  Patient Active Problem List   Diagnosis Date Noted   Acute maxillary sinusitis 07/12/2022   Bipolar disorder (HCC) 12/06/2021   Generalized anxiety disorder 12/06/2021   Community acquired pneumonia 11/30/2021   Cough 11/30/2021   Acute urinary tract infection 09/12/2021   AVN of femur (HCC) 03/30/2019   Major depressive disorder, recurrent severe without psychotic features (HCC) 05/02/2015   Suicidal ideation    Abdominal pain    Diarrhea     Allergies:  Allergies  Allergen Reactions   Metronidazole Nausea And Vomiting and Other (See Comments)   Sulfamethoxazole-Trimethoprim Other (See Comments)    nausea   Cefdinir Nausea Only and Other (See Comments)    Abdominal pain    Morphine Rash   Medications:  Current Outpatient Medications:    albuterol (VENTOLIN HFA) 108 (90 Base) MCG/ACT inhaler, Inhale into the lungs every 6 (six) hours as needed for wheezing or shortness of breath., Disp: , Rfl:    aspirin EC 81 MG tablet, Take 1 tablet (81 mg total) by mouth daily. Swallow whole., Disp: 90 tablet, Rfl: 3   Cholecalciferol 1.25 MG (50000 UT) capsule, Take 50,000 Units by mouth daily. Sunday, Disp: ,  Rfl:    ciprofloxacin (CIPRO) 500 MG tablet, Take 1 tablet (500 mg total) by mouth 2 (two) times daily., Disp: 14 tablet, Rfl: 0   clonazePAM (KLONOPIN) 1 MG tablet, Take 1 mg by mouth 2 (two) times daily., Disp: , Rfl:    cyclobenzaprine (FLEXERIL) 10 MG tablet, Take 1 tablet (10 mg total) by mouth 2 (two) times daily as needed for muscle spasms. (Patient not taking: Reported on 03/19/2023), Disp: 20 tablet, Rfl: 0   dicyclomine (BENTYL) 20 MG tablet, Take 1 tablet (20 mg total) by mouth 3 (three) times daily as needed for spasms., Disp: 90 tablet, Rfl: 1   escitalopram (LEXAPRO) 10 MG tablet, Take 10 mg by mouth daily., Disp: , Rfl:    ezetimibe (ZETIA) 10 MG tablet, Take 1 tablet (10 mg total) by mouth daily., Disp: 90 tablet, Rfl: 3   FLOWFLEX COVID-19 AG HOME TEST KIT, TEST AS DIRECTED TODAY (Patient not taking: Reported on 03/19/2023), Disp: , Rfl:    HYDROcodone bit-homatropine (HYDROMET) 5-1.5 MG/5ML syrup, Take 5 mLs by mouth every 6 (six) hours as needed for cough., Disp: 120 mL, Rfl: 0   lamoTRIgine (LAMICTAL) 150 MG tablet, Take 150 mg by mouth daily., Disp: , Rfl:    metoprolol tartrate (LOPRESSOR) 50 MG tablet, Take 1 tablet (50 mg total) by mouth once for 1 dose. PLEASE TAKE METOPROLOL 2  HOURS PRIOR TO CTA SCAN., Disp: 1 tablet, Rfl: 0   nitrofurantoin, macrocrystal-monohydrate, (MACROBID) 100 MG capsule, Take 100 mg by mouth 2 (two) times daily. For 7 days, Disp: , Rfl:    omeprazole (PRILOSEC) 20 MG capsule, Take 20 mg by mouth as needed., Disp: , Rfl:    OVER THE COUNTER MEDICATION, Vitamin d 1000 units daily, Disp: , Rfl:    PFIZER COVID-19 VAC BIVALENT injection, , Disp: , Rfl:    predniSONE (STERAPRED UNI-PAK 21 TAB) 10 MG (21) TBPK tablet, 6 day taper; take as directed on package instructions, Disp: 21 tablet, Rfl: 0   propranolol (INDERAL) 20 MG tablet, Take 20 mg by mouth as needed. For tachacardia, Disp: , Rfl:    rosuvastatin (CRESTOR) 20 MG tablet, Take 1 tablet (20 mg  total) by mouth every evening., Disp: 90 tablet, Rfl: 3   topiramate (TOPAMAX) 25 MG tablet, , Disp: , Rfl:    traMADol (ULTRAM) 50 MG tablet, Take 50 mg by mouth every 6 (six) hours as needed., Disp: , Rfl:   Observations/Objective: Patient is well-developed, well-nourished in no acute distress.  Resting comfortably at home.  Head is normocephalic, atraumatic.  No labored breathing.  Speech is clear and coherent with logical content.  Patient is alert and oriented at baseline.    Assessment and Plan: 1. Bronchitis - predniSONE (STERAPRED UNI-PAK 21 TAB) 10 MG (21) TBPK tablet; 6 day taper; take as directed on package instructions  Dispense: 21 tablet; Refill: 0  - Worsening acutely last night despite using Duoneb - Will treat with Prednisone - Can continue Mucinex or Deslym - Continue to use Duoneb as prescribed, use albuterol inhaler as  needed - Push fluids.  - Rest.  - Steam and humidifier can help - Seek in person evaluation if worsening or symptoms fail to improve    Follow Up Instructions: I discussed the assessment and treatment plan with the patient. The patient was provided an opportunity to ask questions and all were answered. The patient agreed with the plan and demonstrated an understanding of the instructions.  A copy of instructions were sent to the patient via MyChart unless otherwise noted below.    The patient was advised to call back or seek an in-person evaluation if the symptoms worsen or if the condition fails to improve as anticipated.  Time:  I spent 10 minutes with the patient via telehealth technology discussing the above problems/concerns.    Margaretann Loveless, PA-C

## 2023-04-10 NOTE — Patient Instructions (Signed)
Sandra Conrad, thank you for joining Margaretann Loveless, PA-C for today's virtual visit.  While this provider is not your primary care provider (PCP), if your PCP is located in our provider database this encounter information will be shared with them immediately following your visit.   A [facility] MyChart account gives you access to today's visit and all your visits, tests, and labs performed at [facility] " click here if you don't have a [facility] MyChart account or go to [email or web address]  Consent: (Patient) Sandra Conrad provided verbal consent for this virtual visit at the beginning of the encounter.  Current Medications:  Current Outpatient Medications:    albuterol (VENTOLIN HFA) 108 (90 Base) MCG/ACT inhaler, Inhale into the lungs every 6 (six) hours as needed for wheezing or shortness of breath., Disp: , Rfl:    aspirin EC 81 MG tablet, Take 1 tablet (81 mg total) by mouth daily. Swallow whole., Disp: 90 tablet, Rfl: 3   Cholecalciferol 1.25 MG (50000 UT) capsule, Take 50,000 Units by mouth daily. Sunday, Disp: , Rfl:    ciprofloxacin (CIPRO) 500 MG tablet, Take 1 tablet (500 mg total) by mouth 2 (two) times daily., Disp: 14 tablet, Rfl: 0   clonazePAM (KLONOPIN) 1 MG tablet, Take 1 mg by mouth 2 (two) times daily., Disp: , Rfl:    cyclobenzaprine (FLEXERIL) 10 MG tablet, Take 1 tablet (10 mg total) by mouth 2 (two) times daily as needed for muscle spasms. (Patient not taking: Reported on 03/19/2023), Disp: 20 tablet, Rfl: 0   dicyclomine (BENTYL) 20 MG tablet, Take 1 tablet (20 mg total) by mouth 3 (three) times daily as needed for spasms., Disp: 90 tablet, Rfl: 1   escitalopram (LEXAPRO) 10 MG tablet, Take 10 mg by mouth daily., Disp: , Rfl:    ezetimibe (ZETIA) 10 MG tablet, Take 1 tablet (10 mg total) by mouth daily., Disp: 90 tablet, Rfl: 3   FLOWFLEX COVID-19 AG HOME TEST KIT, TEST AS DIRECTED TODAY (Patient not taking: Reported on 03/19/2023), Disp: ,  Rfl:    HYDROcodone bit-homatropine (HYDROMET) 5-1.5 MG/5ML syrup, Take 5 mLs by mouth every 6 (six) hours as needed for cough., Disp: 120 mL, Rfl: 0   lamoTRIgine (LAMICTAL) 150 MG tablet, Take 150 mg by mouth daily., Disp: , Rfl:    metoprolol tartrate (LOPRESSOR) 50 MG tablet, Take 1 tablet (50 mg total) by mouth once for 1 dose. PLEASE TAKE METOPROLOL 2  HOURS PRIOR TO CTA SCAN., Disp: 1 tablet, Rfl: 0   nitrofurantoin, macrocrystal-monohydrate, (MACROBID) 100 MG capsule, Take 100 mg by mouth 2 (two) times daily. For 7 days, Disp: , Rfl:    omeprazole (PRILOSEC) 20 MG capsule, Take 20 mg by mouth as needed., Disp: , Rfl:    OVER THE COUNTER MEDICATION, Vitamin d 1000 units daily, Disp: , Rfl:    PFIZER COVID-19 VAC BIVALENT injection, , Disp: , Rfl:    predniSONE (STERAPRED UNI-PAK 21 TAB) 10 MG (21) TBPK tablet, 6 day taper; take as directed on package instructions, Disp: 21 tablet, Rfl: 0   propranolol (INDERAL) 20 MG tablet, Take 20 mg by mouth as needed. For tachacardia, Disp: , Rfl:    rosuvastatin (CRESTOR) 20 MG tablet, Take 1 tablet (20 mg total) by mouth every evening., Disp: 90 tablet, Rfl: 3   topiramate (TOPAMAX) 25 MG tablet, , Disp: , Rfl:    traMADol (ULTRAM) 50 MG tablet, Take 50 mg by mouth every 6 (six) hours as needed., Disp: ,  Rfl:    Medications ordered in this encounter:  Meds ordered this encounter  Medications   predniSONE (STERAPRED UNI-PAK 21 TAB) 10 MG (21) TBPK tablet    Sig: 6 day taper; take as directed on package instructions    Dispense:  21 tablet    Refill:  0    Order Specific Question:   Supervising Provider    Answer:   Merrilee Jansky [id number]     *If you need refills on other medications prior to your next appointment, please contact your pharmacy*  Follow-Up: Call back or seek an in-person evaluation if the symptoms worsen or if the condition fails to improve as anticipated.  [facility] [phone]  Other  Instructions Acute Bronchitis, Adult  Acute bronchitis is sudden inflammation of the main airways (bronchi) that come off the windpipe (trachea) in the lungs. The swelling causes the airways to get smaller and make more mucus than normal. This can make it hard to breathe and can cause coughing or noisy breathing (wheezing). Acute bronchitis may last several weeks. The cough may last longer. Allergies, asthma, and exposure to smoke may make the condition worse. What are the causes? This condition can be caused by germs and by substances that irritate the lungs, including: Cold and flu viruses. The most common cause of this condition is the virus that causes the common cold. Bacteria. This is less common. Breathing in substances that irritate the lungs, including: Smoke from cigarettes and other forms of tobacco. Dust and pollen. Fumes from household cleaning products, gases, or burned fuel. Indoor or outdoor air pollution. What increases the risk? The following factors may make you more likely to develop this condition: A weak body's defense system, also called the immune system. A condition that affects your lungs and breathing, such as asthma. What are the signs or symptoms? Common symptoms of this condition include: Coughing. This may bring up clear, yellow, or green mucus from your lungs (sputum). Wheezing. Runny or stuffy nose. Having too much mucus in your lungs (chest congestion). Shortness of breath. Aches and pains, including sore throat or chest. How is this diagnosed? This condition is usually diagnosed based on: Your symptoms and medical history. A physical exam. You may also have other tests, including tests to rule out other conditions, such as pneumonia. These tests include: A test of lung function. Test of a mucus sample to look for the presence of bacteria. Tests to check the oxygen level in your blood. Blood tests. Chest X-ray. How is this treated? Most cases of  acute bronchitis clear up over time without treatment. Your health care provider may recommend: Drinking more fluids to help thin your mucus so it is easier to cough up. Taking inhaled medicine (inhaler) to improve air flow in and out of your lungs. Using a vaporizer or a humidifier. These are machines that add water to the air to help you breathe better. Taking a medicine that thins mucus and clears congestion (expectorant). Taking a medicine that prevents or stops coughing (cough suppressant). It is not common to take an antibiotic medicine for this condition. Follow these instructions at home:  Take over-the-counter and prescription medicines only as told by your health care provider. Use an inhaler, vaporizer, or humidifier as told by your health care provider. Take two teaspoons (10 mL) of honey at bedtime to lessen coughing at night. Drink enough fluid to keep your urine pale yellow. Do not use any products that contain nicotine or  tobacco. These products include cigarettes, chewing tobacco, and vaping devices, such as e-cigarettes. If you need help quitting, ask your health care provider. Get plenty of rest. Return to your normal activities as told by your health care provider. Ask your health care provider what activities are safe for you. Keep all follow-up visits. This is important. How is this prevented? To lower your risk of getting this condition again: Wash your hands often with soap and water for at least 20 seconds. If soap and water are not available, use hand sanitizer. Avoid contact with people who have cold symptoms. Try not to touch your mouth, nose, or eyes with your hands. Avoid breathing in smoke or chemical fumes. Breathing smoke or chemical fumes will make your condition worse. Get the flu shot every year. Contact a health care provider if: Your symptoms do not improve after 2 weeks. You have trouble coughing up the mucus. Your cough keeps you awake at night. You  have a fever. Get help right away if you: Cough up blood. Feel pain in your chest. Have severe shortness of breath. Faint or keep feeling like you are going to faint. Have a severe headache. Have a fever or chills that get worse. These symptoms may represent a serious problem that is an emergency. Do not wait to see if the symptoms will go away. Get medical help right away. Call your local emergency services (911 in the [place] Do not drive yourself to the hospital. Summary Acute bronchitis is inflammation of the main airways (bronchi) that come off the windpipe (trachea) in the lungs. The swelling causes the airways to get smaller and make more mucus than normal. Drinking more fluids can help thin your mucus so it is easier to cough up. Take over-the-counter and prescription medicines only as told by your health care provider. Do not use any products that contain nicotine or tobacco. These products include cigarettes, chewing tobacco, and vaping devices, such as e-cigarettes. If you need help quitting, ask your health care provider. Contact a health care provider if your symptoms do not improve after 2 weeks. This information is not intended to replace advice given to you by your health care provider. Make sure you discuss any questions you have with your health care provider. Document Revised: 01/03/2022 Document Reviewed: 01/24/2021 Elsevier Patient Education  2024 [employer]    If you have been instructed to have an in-person evaluation today at a local Urgent Care facility, please use the link below. It will take you to a list of all of our available [facility], including address, phone number and hours of operation. Please do not delay care.  [facility]  If you or a family member do not have a primary care provider, use the link below to schedule a visit and establish care. When you choose a [facility] primary care physician or advanced practice provider,  you gain a long-term partner in health. Find a Primary Care Provider  Learn more about [facility]'s in-office and virtual care options: [facility] - Get Care Now

## 2023-04-18 ENCOUNTER — Other Ambulatory Visit: Payer: Self-pay | Admitting: Internal Medicine

## 2023-04-18 ENCOUNTER — Telehealth: Payer: Self-pay | Admitting: Internal Medicine

## 2023-04-18 MED ORDER — CIPROFLOXACIN HCL 500 MG PO TABS: 500.0000 mg | ORAL_TABLET | Freq: Two times a day (BID) | ORAL | 0 refills | Status: DC

## 2023-04-18 MED ORDER — DICYCLOMINE HCL 10 MG PO CAPS: 10.0000 mg | ORAL_CAPSULE | Freq: Three times a day (TID) | ORAL | 0 refills | Status: DC

## 2023-04-18 NOTE — Telephone Encounter (Signed)
Prescriptions sent in as requested 

## 2023-04-18 NOTE — Telephone Encounter (Signed)
PT is experiencing SIBO/ diverticulosis flare. She is asking for a scipt for Cipro and bentyl sent to [facility]

## 2023-04-18 NOTE — Telephone Encounter (Signed)
Refill 1 course of medicines as previously prescribed

## 2023-04-18 NOTE — Telephone Encounter (Signed)
Pyrtle pt calling states she is having a flare of her SIBO. She reports she is having bloating, mid-epigastric pain, abd pain and distention. Reports Dr. Rhea Belton had prescribed xifaxan but she could not afford that she he has given her Cipro 500mg  BID for 10 days and that helps along with bentyl. She states he told her to call when she had a flare and he would send in the medications. Dr. Marina Goodell as DOD please advise.

## 2023-04-21 DIAGNOSIS — M19072 Primary osteoarthritis, left ankle and foot: Secondary | ICD-10-CM | POA: Diagnosis not present

## 2023-04-21 DIAGNOSIS — M79672 Pain in left foot: Secondary | ICD-10-CM | POA: Diagnosis not present

## 2023-05-07 DIAGNOSIS — H02423 Myogenic ptosis of bilateral eyelids: Secondary | ICD-10-CM | POA: Diagnosis not present

## 2023-05-07 DIAGNOSIS — H02535 Eyelid retraction left lower eyelid: Secondary | ICD-10-CM | POA: Diagnosis not present

## 2023-05-07 DIAGNOSIS — H02413 Mechanical ptosis of bilateral eyelids: Secondary | ICD-10-CM | POA: Diagnosis not present

## 2023-05-07 DIAGNOSIS — H57813 Brow ptosis, bilateral: Secondary | ICD-10-CM | POA: Diagnosis not present

## 2023-05-07 DIAGNOSIS — H02421 Myogenic ptosis of right eyelid: Secondary | ICD-10-CM | POA: Diagnosis not present

## 2023-05-07 DIAGNOSIS — H02422 Myogenic ptosis of left eyelid: Secondary | ICD-10-CM | POA: Diagnosis not present

## 2023-05-07 DIAGNOSIS — H02831 Dermatochalasis of right upper eyelid: Secondary | ICD-10-CM | POA: Diagnosis not present

## 2023-05-07 DIAGNOSIS — H02411 Mechanical ptosis of right eyelid: Secondary | ICD-10-CM | POA: Diagnosis not present

## 2023-05-07 DIAGNOSIS — H02834 Dermatochalasis of left upper eyelid: Secondary | ICD-10-CM | POA: Diagnosis not present

## 2023-05-07 DIAGNOSIS — H0279 Other degenerative disorders of eyelid and periocular area: Secondary | ICD-10-CM | POA: Diagnosis not present

## 2023-05-07 DIAGNOSIS — H02532 Eyelid retraction right lower eyelid: Secondary | ICD-10-CM | POA: Diagnosis not present

## 2023-05-07 DIAGNOSIS — H02412 Mechanical ptosis of left eyelid: Secondary | ICD-10-CM | POA: Diagnosis not present

## 2023-05-10 ENCOUNTER — Other Ambulatory Visit: Payer: Self-pay

## 2023-05-10 ENCOUNTER — Emergency Department (HOSPITAL_BASED_OUTPATIENT_CLINIC_OR_DEPARTMENT_OTHER): Payer: Medicare Other

## 2023-05-10 ENCOUNTER — Encounter (HOSPITAL_BASED_OUTPATIENT_CLINIC_OR_DEPARTMENT_OTHER): Payer: Self-pay

## 2023-05-10 ENCOUNTER — Emergency Department (HOSPITAL_BASED_OUTPATIENT_CLINIC_OR_DEPARTMENT_OTHER)
Admission: EM | Admit: 2023-05-10 | Discharge: 2023-05-10 | Disposition: A | Discharge status: Home / Self Care | Payer: Medicare Other | Attending: Emergency Medicine | Admitting: Emergency Medicine

## 2023-05-10 DIAGNOSIS — S32010A Wedge compression fracture of first lumbar vertebra, initial encounter for closed fracture: Secondary | ICD-10-CM

## 2023-05-10 DIAGNOSIS — R935 Abnormal findings on diagnostic imaging of other abdominal regions, including retroperitoneum: Secondary | ICD-10-CM | POA: Diagnosis not present

## 2023-05-10 DIAGNOSIS — W19XXXD Unspecified fall, subsequent encounter: Secondary | ICD-10-CM | POA: Diagnosis not present

## 2023-05-10 DIAGNOSIS — S32010D Wedge compression fracture of first lumbar vertebra, subsequent encounter for fracture with routine healing: Secondary | ICD-10-CM | POA: Diagnosis not present

## 2023-05-10 DIAGNOSIS — R109 Unspecified abdominal pain: Secondary | ICD-10-CM | POA: Diagnosis not present

## 2023-05-10 DIAGNOSIS — S22080A Wedge compression fracture of T11-T12 vertebra, initial encounter for closed fracture: Secondary | ICD-10-CM | POA: Diagnosis not present

## 2023-05-10 DIAGNOSIS — R1013 Epigastric pain: Secondary | ICD-10-CM | POA: Insufficient documentation

## 2023-05-10 DIAGNOSIS — K5730 Diverticulosis of large intestine without perforation or abscess without bleeding: Secondary | ICD-10-CM | POA: Diagnosis not present

## 2023-05-10 DIAGNOSIS — Z7982 Long term (current) use of aspirin: Secondary | ICD-10-CM | POA: Diagnosis not present

## 2023-05-10 LAB — CBC WITH DIFFERENTIAL/PLATELET
Abs Immature Granulocytes: 0.01 10*3/uL (ref 0.00–0.07)
Basophils Absolute: 0.1 10*3/uL (ref 0.0–0.1)
Basophils Relative: 1 %
Eosinophils Absolute: 0.1 10*3/uL (ref 0.0–0.5)
Eosinophils Relative: 1 %
HCT: 42.4 % (ref 36.0–46.0)
Hemoglobin: 14.1 g/dL (ref 12.0–15.0)
Immature Granulocytes: 0 %
Lymphocytes Relative: 23 %
Lymphs Abs: 1.5 10*3/uL (ref 0.7–4.0)
MCH: 32.6 pg (ref 26.0–34.0)
MCHC: 33.3 g/dL (ref 30.0–36.0)
MCV: 97.9 fL (ref 80.0–100.0)
Monocytes Absolute: 0.5 10*3/uL (ref 0.1–1.0)
Monocytes Relative: 8 %
Neutro Abs: 4.3 10*3/uL (ref 1.7–7.7)
Neutrophils Relative %: 67 %
Platelets: 377 10*3/uL (ref 150–400)
RBC: 4.33 MIL/uL (ref 3.87–5.11)
RDW: 14 % (ref 11.5–15.5)
WBC: 6.4 10*3/uL (ref 4.0–10.5)
nRBC: 0 % (ref 0.0–0.2)

## 2023-05-10 LAB — URINALYSIS, ROUTINE W REFLEX MICROSCOPIC
Bilirubin Urine: NEGATIVE
Glucose, UA: NEGATIVE mg/dL
Hgb urine dipstick: NEGATIVE
Ketones, ur: NEGATIVE mg/dL
Leukocytes,Ua: NEGATIVE
Nitrite: NEGATIVE
Specific Gravity, Urine: >1.046 — ABNORMAL HIGH (ref 1.005–1.030)
pH: 6 (ref 5.0–8.0)

## 2023-05-10 LAB — COMPREHENSIVE METABOLIC PANEL
ALT: 18 U/L (ref 0–44)
AST: 18 U/L (ref 15–41)
Albumin: 4.2 g/dL (ref 3.5–5.0)
Alkaline Phosphatase: 76 U/L (ref 38–126)
Anion gap: 9 (ref 5–15)
BUN: 10 mg/dL (ref 8–23)
CO2: 25 mmol/L (ref 22–32)
Calcium: 9.4 mg/dL (ref 8.9–10.3)
Chloride: 103 mmol/L (ref 98–111)
Creatinine, Ser: 0.65 mg/dL (ref 0.44–1.00)
GFR, Estimated: >60 mL/min (ref >60)
Glucose, Bld: 115 mg/dL — ABNORMAL HIGH (ref 70–99)
Potassium: 3.8 mmol/L (ref 3.5–5.1)
Sodium: 137 mmol/L (ref 135–145)
Total Bilirubin: 0.4 mg/dL (ref 0.3–1.2)
Total Protein: 6.9 g/dL (ref 6.5–8.1)

## 2023-05-10 LAB — LIPASE, BLOOD: Lipase: 27 U/L (ref 11–51)

## 2023-05-10 MED ORDER — ONDANSETRON HCL 4 MG/2ML IJ SOLN
4.0000 mg | Freq: Once | INTRAMUSCULAR | Status: AC
  Administered 2023-05-10: 4 mg via INTRAVENOUS
  Filled 2023-05-10: qty 2

## 2023-05-10 MED ORDER — IOHEXOL 300 MG/ML  SOLN
100.0000 mL | Freq: Once | INTRAMUSCULAR | Status: AC | PRN
  Administered 2023-05-10: 100 mL via INTRAVENOUS

## 2023-05-10 MED ORDER — SUCRALFATE 1 G PO TABS: 1.0000 g | ORAL_TABLET | Freq: Three times a day (TID) | ORAL | 0 refills | Status: DC

## 2023-05-10 MED ORDER — HYDROMORPHONE HCL 1 MG/ML IJ SOLN
1.0000 mg | Freq: Once | INTRAMUSCULAR | Status: AC
  Administered 2023-05-10: 1 mg via INTRAVENOUS
  Filled 2023-05-10: qty 1

## 2023-05-10 NOTE — ED Provider Notes (Signed)
[facility] AT [facility] Provider Note   CSN: [id number] Arrival date & time: 05/10/23  1039     History  Chief Complaint  Patient presents with   Abdominal Pain    Sandra Conrad is a 67 y.o. female presented to Emergency Department with complaint of abdominal pain and nausea.  Patient reports that she has a history of diverticulosis and also gets diverticulitis, as well as reflux and GERD.  Her gastroenterologist had put her on ciprofloxacin last week which she completed, along with Bentyl, but her abdominal pain is persistent, now diffuse and all across the lower abdomen.  She says the pain is quite severe.  She has been taking more NSAIDs than typical for her chronic back pain, but feels that this is more than reflux.  She reports very loose frequent bowel movements.  HPI     Home Medications Prior to Admission medications   Medication Sig Start Date End Date Taking? Authorizing Provider  sucralfate (CARAFATE) 1 g tablet Take 1 tablet (1 g total) by mouth 4 (four) times daily -  with meals and at bedtime. 05/10/23 06/09/23 Yes Olando Willems, Kermit Balo, MD  albuterol (VENTOLIN HFA) 108 (90 Base) MCG/ACT inhaler Inhale into the lungs every 6 (six) hours as needed for wheezing or shortness of breath.    [provider]  aspirin EC 81 MG tablet Take 1 tablet (81 mg total) by mouth daily. Swallow whole. 08/06/22   Maisie Fus, MD  Cholecalciferol 1.25 MG (50000 UT) capsule Take 50,000 Units by mouth daily. Sunday    [provider]  ciprofloxacin (CIPRO) 500 MG tablet Take 1 tablet (500 mg total) by mouth 2 (two) times daily. 07/25/22   Pyrtle, Carie Caddy, MD  ciprofloxacin (CIPRO) 500 MG tablet Take 1 tablet (500 mg total) by mouth 2 (two) times daily. 04/18/23   Hilarie Fredrickson, MD  clonazePAM (KLONOPIN) 1 MG tablet Take 1 mg by mouth 2 (two) times daily.    [provider]  cyclobenzaprine (FLEXERIL) 10 MG tablet Take 1 tablet (10 mg total) by  mouth 2 (two) times daily as needed for muscle spasms. Patient not taking: Reported on 03/19/2023 09/30/22   Smitty Knudsen, PA-C  dicyclomine (BENTYL) 10 MG capsule Take 1 capsule (10 mg total) by mouth 4 (four) times daily -  before meals and at bedtime. 04/18/23   Hilarie Fredrickson, MD  dicyclomine (BENTYL) 20 MG tablet Take 1 tablet (20 mg total) by mouth 3 (three) times daily as needed for spasms. 10/09/21   Pyrtle, Carie Caddy, MD  escitalopram (LEXAPRO) 10 MG tablet Take 10 mg by mouth daily.    [provider]  ezetimibe (ZETIA) 10 MG tablet Take 1 tablet (10 mg total) by mouth daily. 08/14/22   Maisie Fus, MD  FLOWFLEX COVID-19 AG HOME TEST KIT TEST AS DIRECTED TODAY Patient not taking: Reported on 03/19/2023 08/21/21   [provider]  HYDROcodone bit-homatropine (HYDROMET) 5-1.5 MG/5ML syrup Take 5 mLs by mouth every 6 (six) hours as needed for cough. 04/03/23   Renne Crigler, PA-C  lamoTRIgine (LAMICTAL) 150 MG tablet Take 150 mg by mouth daily.    [provider]  metoprolol tartrate (LOPRESSOR) 50 MG tablet Take 1 tablet (50 mg total) by mouth once for 1 dose. PLEASE TAKE METOPROLOL 2  HOURS PRIOR TO CTA SCAN. 07/18/22 07/18/22  Maisie Fus, MD  nitrofurantoin, macrocrystal-monohydrate, (MACROBID) 100 MG capsule Take 100 mg by mouth  2 (two) times daily. For 7 days    [provider]  omeprazole (PRILOSEC) 20 MG capsule Take 20 mg by mouth as needed.    [provider]  OVER THE COUNTER MEDICATION Vitamin d 1000 units daily    [provider]  PFIZER COVID-19 VAC BIVALENT injection  07/16/21   [provider]  predniSONE (STERAPRED UNI-PAK 21 TAB) 10 MG (21) TBPK tablet 6 day taper; take as directed on package instructions 04/10/23   Margaretann Loveless, PA-C  propranolol (INDERAL) 20 MG tablet Take 20 mg by mouth as needed. For tachacardia    [provider]  rosuvastatin (CRESTOR) 20 MG tablet Take 1 tablet (20 mg total)  by mouth every evening. 08/14/22   Maisie Fus, MD  topiramate (TOPAMAX) 25 MG tablet  10/31/21   [provider]  traMADol (ULTRAM) 50 MG tablet Take 50 mg by mouth every 6 (six) hours as needed. 07/13/21   [provider]      Allergies    Metronidazole, Sulfamethoxazole-trimethoprim, Cefdinir, and Morphine    Review of Systems   Review of Systems  Physical Exam Updated Vital Signs BP 107/72 (BP Location: Right Arm)   Pulse (!) 56   Temp 98.3 F (36.8 C) (Oral)   Resp 16   Ht 5\' 6"  (1.676 m)   Wt 88.5 kg   SpO2 94%   BMI 31.49 kg/m  Physical Exam Constitutional:      General: She is not in acute distress. HENT:     Head: Normocephalic and atraumatic.  Eyes:     Conjunctiva/sclera: Conjunctivae normal.     Pupils: Pupils are equal, round, and reactive to light.  Cardiovascular:     Rate and Rhythm: Normal rate and regular rhythm.  Pulmonary:     Effort: Pulmonary effort is normal. No respiratory distress.  Abdominal:     General: There is no distension.     Tenderness: There is generalized abdominal tenderness.  Skin:    General: Skin is warm and dry.  Neurological:     General: No focal deficit present.     Mental Status: She is alert. Mental status is at baseline.  Psychiatric:        Mood and Affect: Mood normal.        Behavior: Behavior normal.     ED Results / Procedures / Treatments   Labs (all labs ordered are listed, but only abnormal results are displayed) Labs Reviewed  COMPREHENSIVE METABOLIC PANEL - Abnormal; Notable for the following components:      Result Value   Glucose, Bld 115 (*)    All other components within normal limits  URINALYSIS, ROUTINE W REFLEX MICROSCOPIC - Abnormal; Notable for the following components:   Specific Gravity, Urine >1.046 (*)    Protein, ur TRACE (*)    All other components within normal limits  CBC WITH DIFFERENTIAL/PLATELET  LIPASE, BLOOD    EKG EKG Interpretation Date/Time:  Saturday  May 10 2023 10:58:28 EDT Ventricular Rate:  70 PR Interval:  164 QRS Duration:  85 QT Interval:  382 QTC Calculation: 413 R Axis:   64  Text Interpretation: Sinus rhythm RSR' in V1 or V2, probably normal variant Confirmed by Alvester Chou [phone]) on 05/10/2023 11:35:13 AM  Radiology CT ABDOMEN PELVIS W CONTRAST  Result Date: 05/10/2023 CLINICAL DATA:  Worsening abdominal pain for 3 days. On NSAID after fall. Severe abdominal pain. EXAM: CT ABDOMEN AND PELVIS WITH CONTRAST TECHNIQUE: Multidetector CT  imaging of the abdomen and pelvis was performed using the standard protocol following bolus administration of intravenous contrast. RADIATION DOSE REDUCTION: This exam was performed according to the departmental dose-optimization program which includes automated exposure control, adjustment of the mA and/or kV according to patient size and/or use of iterative reconstruction technique. CONTRAST:  OMNIPAQUE IOHEXOL 300 MG/ML  SOLN COMPARISON:  08/07/2021. FINDINGS: Lower chest: Mild atelectasis in the lower lungs. Coronary atherosclerosis. Hepatobiliary: No focal liver abnormality.No evidence of biliary obstruction or stone. Pancreas: Unremarkable. Spleen: Granulomatous type calcifications. Adrenals/Urinary Tract: Negative adrenals. No hydronephrosis or stone. Unremarkable bladder. Stomach/Bowel: No obstruction. Numerous distal colonic diverticula. No evidence of bowel inflammation. Vascular/Lymphatic: No acute vascular abnormality. Diffuse atheromatous calcification of the aorta. No mass or adenopathy. Reproductive:No pathologic findings. Other: No ascites or pneumoperitoneum. Musculoskeletal: No acute abnormalities. L1 compression fracture with horizontal sclerotic bands from trabecular impaction, likely subacute. Advanced L4-5 disc degeneration. IMPRESSION: 1. Subacute appearing L1 compression fracture with mild height loss. 2. No acute intra-abdominal finding. 3. Atherosclerosis and colonic  diverticulosis. Electronically Signed   By: Tiburcio Pea M.D.   On: 05/10/2023 12:28    Procedures Procedures    Medications Ordered in ED Medications  HYDROmorphone (DILAUDID) injection 1 mg (1 mg Intravenous Given 05/10/23 1147)  ondansetron (ZOFRAN) injection 4 mg (4 mg Intravenous Given 05/10/23 1147)  iohexol (OMNIPAQUE) 300 MG/ML solution 100 mL (100 mLs Intravenous Contrast Given 05/10/23 1213)    ED Course/ Medical Decision Making/ A&P Clinical Course as of 05/10/23 1446  Sat May 10, 2023  1348 Patient reassessed and she is feeling better at this time.  No evidence of any surgical emergency or infectious emergency.  CT imaging without inflammatory findings.  The patient is aware of the L1 compression fracture which she reports occurred in July and she already sees her neurosurgeon for this.  I suspect that her symptoms may be related to gastritis from overuse of NSAIDs.  She takes Pepcid twice daily but we will add Carafate, and advise she follow-up with GI.  She verbalized understanding and agreement and is comfortable this plan.  She is calling a family member to drive her [MT]    Clinical Course User Index [MT] Kalila Adkison, Kermit Balo, MD                                 Medical Decision Making Amount and/or Complexity of Data Reviewed Labs: ordered. Radiology: ordered.  Risk Prescription drug management.   This patient presents to the ED with concern for generalized abdominal pain, loose stools. This involves an extensive number of treatment options, and is a complaint that carries with it a high risk of complications and morbidity.  The differential diagnosis includes diverticulitis versus colitis versus pancreatitis versus other  Co-morbidities that complicate the patient evaluation: History of diverticulosis at higher risk of diverticolitis  External records from outside source obtained and reviewed including colonoscopy 11/27/21 noting sigmoid diverticulosis, multiple polyps  that were removed  I ordered and personally interpreted labs.  The pertinent results include: No emergent findings  I ordered imaging studies including CT abdomen pelvis I independently visualized and interpreted imaging which showed no emergent finding, question of "subacute" L1 compression fracture I agree with the radiologist interpretation  The patient was maintained on a cardiac monitor.  I personally viewed and interpreted the cardiac monitored which showed an underlying rhythm of: Sinus rhythm  Per my interpretation the patient's ECG  shows no acute ischemic findings  I ordered medication including IV Dilaudid and Zofran for nausea and pain  I have reviewed the patients home medicines and have made adjustments as needed  After the interventions noted above, I reevaluated the patient and found that they have: improved  Dispostion:  After consideration of the diagnostic results and the patients response to treatment, I feel that the patent would benefit from close outpatient follow-up         Final Clinical Impression(s) / ED Diagnoses Final diagnoses:  Epigastric pain  Closed compression fracture of body of L1 vertebra (HCC)    Rx / DC Orders ED Discharge Orders          Ordered    sucralfate (CARAFATE) 1 g tablet  3 times daily with meals & bedtime        05/10/23 1347              Terald Sleeper, MD 05/10/23 1446

## 2023-05-10 NOTE — Discharge Instructions (Addendum)
You received an opioid narcotic in the ER should not operate a car or machinery today.  Your CT scans and blood test did not show any emergency findings on your workup today.  It is possible that you are having irritation of your stomach lining from using too many NSAID medications.  I prescribed Carafate to take up to 4 times a day as prescribed, in addition to the Pepcid you already taking.  We also talked about your L1 compression fracture which you said you were aware of already.  You should follow-up with gastroenterologist for your abdominal pain.

## 2023-05-10 NOTE — ED Triage Notes (Signed)
Patient arrives with complaints of worsening abdominal pain x3 days. Patient states that she started taking NSAIDs after a fall. She developed an upset stomach, was started on Cipro and taking bentyl, and now having severe abdominal pain. Rates pain a 7/10. Hx of diverticulosis

## 2023-05-13 DIAGNOSIS — F17210 Nicotine dependence, cigarettes, uncomplicated: Secondary | ICD-10-CM | POA: Diagnosis not present

## 2023-05-13 DIAGNOSIS — M5136 Other intervertebral disc degeneration, lumbar region: Secondary | ICD-10-CM | POA: Diagnosis not present

## 2023-05-13 DIAGNOSIS — Z79899 Other long term (current) drug therapy: Secondary | ICD-10-CM | POA: Diagnosis not present

## 2023-05-13 DIAGNOSIS — M5442 Lumbago with sciatica, left side: Secondary | ICD-10-CM | POA: Diagnosis not present

## 2023-05-13 DIAGNOSIS — R1013 Epigastric pain: Secondary | ICD-10-CM | POA: Diagnosis not present

## 2023-05-13 DIAGNOSIS — M549 Dorsalgia, unspecified: Secondary | ICD-10-CM | POA: Diagnosis not present

## 2023-05-13 DIAGNOSIS — R109 Unspecified abdominal pain: Secondary | ICD-10-CM | POA: Diagnosis not present

## 2023-05-13 DIAGNOSIS — M5430 Sciatica, unspecified side: Secondary | ICD-10-CM | POA: Diagnosis not present

## 2023-05-13 DIAGNOSIS — M19072 Primary osteoarthritis, left ankle and foot: Secondary | ICD-10-CM | POA: Diagnosis not present

## 2023-05-13 DIAGNOSIS — Z713 Dietary counseling and surveillance: Secondary | ICD-10-CM | POA: Diagnosis not present

## 2023-05-19 ENCOUNTER — Ambulatory Visit
Admission: RE | Admit: 2023-05-19 | Discharge: 2023-05-19 | Disposition: A | Discharge status: Home / Self Care | Payer: Medicare Other | Source: Ambulatory Visit | Attending: Internal Medicine | Admitting: Internal Medicine

## 2023-05-19 DIAGNOSIS — Z1231 Encounter for screening mammogram for malignant neoplasm of breast: Secondary | ICD-10-CM

## 2023-06-05 ENCOUNTER — Telehealth: Payer: Self-pay

## 2023-06-05 NOTE — Telephone Encounter (Signed)
   Name: Sandra Conrad  DOB: 1956/04/02  MRN: [id number]  Primary Cardiologist: Maisie Fus, MD   Preoperative team, please contact this patient and set up a phone call appointment for further preoperative risk assessment. Please obtain consent and complete medication review. Thank you for your help.  I confirm that guidance regarding antiplatelet and oral anticoagulation therapy has been completed and, if necessary, noted below.  Per office protocol, if patient is without any new symptoms or concerns at the time of their virtual visit, she may hold Aspirin for 5-7 days prior to procedure. Please resume Aspirin as soon as possible postprocedure, at the discretion of the surgeon.     Joylene Grapes, NP 06/05/2023, 12:27 PM  [facility]

## 2023-06-05 NOTE — Telephone Encounter (Signed)
   Pre-operative Risk Assessment    Patient Name: BRAYLYN GOODIE  DOB: 05-13-1956 MRN: [id number]      Request for Surgical Clearance    Procedure:   Bilateral upper eyelid blepharopotosis repair, blitateral upper eyelid blepharoplasty and trichophytic forehead  Date of Surgery:  Clearance TBD                                 Surgeon:  DR. Shawna Orleans Surgeon's Group or Practice Name:  [facility] Phone number:  [phone] Fax number:  [phone]   Type of Clearance Requested:   - Medical  - Pharmacy:  Hold Aspirin NEEDS INSTRUCTIONS WHEN TO HOLD   Type of Anesthesia:  MAC   Additional requests/questions:    Signed, Michaelle Copas   06/05/2023, 11:28 AM

## 2023-06-05 NOTE — Telephone Encounter (Signed)
 1st attempt at scheduling tele preop appt. [email or web address]

## 2023-06-10 ENCOUNTER — Telehealth: Payer: Self-pay

## 2023-06-10 NOTE — Telephone Encounter (Signed)
Pt is scheduled 9/13 at 10am. Med rec and consent done

## 2023-06-10 NOTE — Telephone Encounter (Signed)
Pt is scheduled 9/13 at 10am. Med rec and consent done    Patient Consent for Virtual Visit        TELIA LOURO has provided verbal consent on 06/10/2023 for a virtual visit (video or telephone).   CONSENT FOR VIRTUAL VISIT FOR:  [name]  By participating in this virtual visit I agree to the following:  I hereby voluntarily request, consent and authorize [facility] and its employed or contracted physicians, physician assistants, nurse practitioners or other licensed health care professionals (the Practitioner), to provide me with telemedicine health care services (the "Services") as deemed necessary by the treating Practitioner. I acknowledge and consent to receive the Services by the Practitioner via telemedicine. I understand that the telemedicine visit will involve communicating with the Practitioner through live audiovisual communication technology and the disclosure of certain medical information by electronic transmission. I acknowledge that I have been given the opportunity to request an in-person assessment or other available alternative prior to the telemedicine visit and am voluntarily participating in the telemedicine visit.  I understand that I have the right to withhold or withdraw my consent to the use of telemedicine in the course of my care at any time, without affecting my right to future care or treatment, and that the Practitioner or I may terminate the telemedicine visit at any time. I understand that I have the right to inspect all information obtained and/or recorded in the course of the telemedicine visit and may receive copies of available information for a reasonable fee.  I understand that some of the potential risks of receiving the Services via telemedicine include:  Delay or interruption in medical evaluation due to technological equipment failure or disruption; Information transmitted may not be sufficient (e.g. poor resolution of images) to allow for  appropriate medical decision making by the Practitioner; and/or  In rare instances, security protocols could fail, causing a breach of personal health information.  Furthermore, I acknowledge that it is my responsibility to provide information about my medical history, conditions and care that is complete and accurate to the best of my ability. I acknowledge that Practitioner's advice, recommendations, and/or decision may be based on factors not within their control, such as incomplete or inaccurate data provided by me or distortions of diagnostic images or specimens that may result from electronic transmissions. I understand that the practice of medicine is not an exact science and that Practitioner makes no warranties or guarantees regarding treatment outcomes. I acknowledge that a copy of this consent can be made available to me via my patient portal [facility] MyChart), or I can request a printed copy by calling the office of [facility].    I understand that my insurance will be billed for this visit.   I have read or had this consent read to me. I understand the contents of this consent, which adequately explains the benefits and risks of the Services being provided via telemedicine.  I have been provided ample opportunity to ask questions regarding this consent and the Services and have had my questions answered to my satisfaction. I give my informed consent for the services to be provided through the use of telemedicine in my medical care

## 2023-06-10 NOTE — Addendum Note (Signed)
Addended by: Anselm Lis A on: 06/10/2023 11:29 AM   Modules accepted: Orders

## 2023-06-16 DIAGNOSIS — H53483 Generalized contraction of visual field, bilateral: Secondary | ICD-10-CM | POA: Diagnosis not present

## 2023-06-20 ENCOUNTER — Ambulatory Visit: Payer: Medicare Other | Attending: Cardiology | Admitting: Cardiology

## 2023-06-20 DIAGNOSIS — Z01810 Encounter for preprocedural cardiovascular examination: Secondary | ICD-10-CM

## 2023-06-20 NOTE — Progress Notes (Signed)
Virtual Visit via Telephone Note   Because of Sandra Conrad's co-morbid illnesses, she is at least at moderate risk for complications without adequate follow up.  This format is felt to be most appropriate for this patient at this time.  The patient did not have access to video technology/had technical difficulties with video requiring transitioning to audio format only (telephone).  All issues noted in this document were discussed and addressed.  No physical exam could be performed with this format.  Please refer to the patient's chart for her consent to telehealth for [facility]  Evaluation Performed:  Preoperative cardiovascular risk assessment _____________   Date:  06/20/2023   Patient ID:  Sandra Conrad, DOB 10-24-1955, MRN [id number] Patient Location:  Home Provider location:   Office  Primary Care Provider:  Benita Stabile, MD Primary Cardiologist:  Maisie Fus, MD  Chief Complaint / Patient Profile   67 y.o. y/o female with a h/o breast cancer s/p lumpectomy (on letrozole ER+) and R sided radiation, elevated CAC (negative FFR) who is pending bilateral upper eyelid blepharopotosis repair, blitateral upper eyelid blepharoplasty and trichophytic forehead and presents today for telephonic preoperative cardiovascular risk assessment. Date of surgery is TBD.  History of Present Illness    [name] is a 67 y.o. female who presents via audio/video conferencing for a telehealth visit today.  Pt was last seen in cardiology clinic on  03/19/23 by Dr. Carolan Clines.  At that time MELENA DOVEY was doing well.  The patient is now pending procedure as outlined above. Since her last visit, she has been doing well from a cardiac perspective. She did have a fall on July 5th but reports that this was mechanical, tripped over a cart at work. Following this fall, she has lost weight due to GI upset which patient feels was provoked by NSAID use. She denies recurrent falls and feels  she has recovered well. At baseline she is exertionally limited by significant orthopedic history but does not have cardiac symptoms with exertion.   She also denies shortness of breath, lower extremity edema, fatigue, palpitations, melena, hematuria, hemoptysis, diaphoresis, weakness, presyncope, syncope, orthopnea, and PND.  Past Medical History    Past Medical History:  Diagnosis Date   Abdominal pain    1 time in last 2 years ago/   Anxiety    Arthritis    Blood transfusion without reported diagnosis 2005   1 time   Cancer [facility]) 2011   Right breast IDC   Complication of anesthesia    Pt request scopolamine patch   Depression    Diarrhea    last time in 2013   Diverticulosis    Dysrhythmia    tachacardia managed by pcp    GERD (gastroesophageal reflux disease)    Hyperlipidemia    Insomnia    Lumbar herniated disc    L3-4 and L5   Lumbar nerve root impingement    right leg   Migraine    Neuropathy of foot    legs due to djd and spinal stenosis intermittent neuropathy   OA (osteoarthritis)    both hips and spine and left foot   Osteoporosis    Personal history of chemotherapy    oral/on meds for 4 years   Personal history of radiation therapy 2008   had 35 treatments/ last on 2008   PONV (postoperative nausea and vomiting)    extreme ponv uses scopolamine patch   Tubular adenoma of  colon    Vitamin D deficiency    Wears glasses    Past Surgical History:  Procedure Laterality Date   BACK SURGERY  08/21/2018   L4-L5 and laminectomy   BREAST EXCISIONAL BIOPSY Right 2008   BREAST LUMPECTOMY  2008   right breast   CARDIAC CATHETERIZATION  2012   no stents/no blockages   COLONOSCOPY     DILATION AND CURETTAGE OF UTERUS  1982   HIP SURGERY Left jan 2005 and oct 2005   left hip x 2 ( pt report that first hip failed, revision was needed)   TOTAL HIP ARTHROPLASTY Right 03/31/2019   Procedure: Right Anterior Total Hip Arthroplasty;  Surgeon: Gean Birchwood, MD;   Location: [facility];  Service: Orthopedics;  Laterality: Right;   WRIST SURGERY  2007   right wrist orif plates and 12 screws still in    Allergies  Allergies  Allergen Reactions   Metronidazole Nausea And Vomiting and Other (See Comments)   Sulfamethoxazole-Trimethoprim Other (See Comments)    nausea   Cefdinir Nausea Only and Other (See Comments)    Abdominal pain    Morphine Rash    Home Medications    Prior to Admission medications   Medication Sig Start Date End Date Taking? Authorizing Provider  aspirin EC 81 MG tablet Take 1 tablet (81 mg total) by mouth daily. Swallow whole. 08/06/22   Maisie Fus, MD  Cholecalciferol 1.25 MG (50000 UT) capsule Take 50,000 Units by mouth daily. Sunday    [provider]  clonazePAM (KLONOPIN) 1 MG tablet Take 1 mg by mouth 2 (two) times daily.    [provider]  dicyclomine (BENTYL) 20 MG tablet Take 1 tablet (20 mg total) by mouth 3 (three) times daily as needed for spasms. 10/09/21   Pyrtle, Carie Caddy, MD  escitalopram (LEXAPRO) 10 MG tablet Take 10 mg by mouth daily.    [provider]  ezetimibe (ZETIA) 10 MG tablet Take 1 tablet (10 mg total) by mouth daily. 08/14/22   Maisie Fus, MD  lamoTRIgine (LAMICTAL) 150 MG tablet Take 150 mg by mouth daily.    [provider]  rosuvastatin (CRESTOR) 20 MG tablet Take 1 tablet (20 mg total) by mouth every evening. 08/14/22   Maisie Fus, MD  traMADol (ULTRAM) 50 MG tablet Take 50 mg by mouth every 6 (six) hours as needed. 07/13/21   [provider]    Physical Exam    Vital Signs:  CHELSAE MCGLOIN does not have vital signs available for review today.  Given telephonic nature of communication, physical exam is limited. AAOx3. NAD. Normal affect.  Speech and respirations are unlabored.  Accessory Clinical Findings    None  Assessment & Plan    1.  Preoperative Cardiovascular Risk Assessment:  The patient was advised that if she develops new  symptoms prior to surgery to contact our office to arrange for a follow-up visit, and she verbalized understanding.  According to the Revised Cardiac Risk Index (RCRI), her Perioperative Risk of Major Cardiac Event is (%): 0.4  Her Functional Capacity in METs is: 8.91 according to the Duke Activity Status Index (DASI).  Therefore, based on ACC/AHA guidelines, patient would be at acceptable risk for the planned procedure without further cardiovascular testing.   Regarding ASA therapy, we recommend continuation of ASA throughout the perioperative period.  However, if the surgeon feels that cessation of ASA is required in the perioperative period, it may be stopped  5-7 days prior to surgery with a plan to resume it as soon as felt to be feasible from a surgical standpoint in the post-operative period.   A copy of this note will be routed to requesting surgeon.  Time:   Today, I have spent 15 minutes with the patient with telehealth technology discussing medical history, symptoms, and management plan.     Perlie Gold, PA-C  06/20/2023, 10:21 AM

## 2023-06-22 ENCOUNTER — Ambulatory Visit
Admission: EM | Admit: 2023-06-22 | Discharge: 2023-06-22 | Disposition: A | Discharge status: Home / Self Care | Payer: Medicare Other | Attending: Nurse Practitioner | Admitting: Nurse Practitioner

## 2023-06-22 ENCOUNTER — Encounter: Payer: Self-pay | Admitting: Emergency Medicine

## 2023-06-22 DIAGNOSIS — L282 Other prurigo: Secondary | ICD-10-CM | POA: Diagnosis not present

## 2023-06-22 MED ORDER — TRIAMCINOLONE ACETONIDE 0.1 % EX CREA: 1.0000 | TOPICAL_CREAM | Freq: Two times a day (BID) | CUTANEOUS | 0 refills | Status: AC

## 2023-06-22 NOTE — ED Provider Notes (Signed)
[facility]    CSN: [id number] Arrival date & time: 06/22/23  0840      History   Chief Complaint No chief complaint on file.   HPI Sandra Conrad is a 67 y.o. female.   The history is provided by the patient.   Patient presents with complaints of a "bump" to the right forearm that started over the past 24 hours.  She states that the area itches and hurts.  She states that she has not noticed an insect bite to the affected area.  She denies fever, chills, chest pain, abdominal pain, nausea, vomiting, or diarrhea.  Past Medical History:  Diagnosis Date   Abdominal pain    1 time in last 2 years ago/   Anxiety    Arthritis    Blood transfusion without reported diagnosis 2005   1 time   Cancer [facility]) 2011   Right breast IDC   Complication of anesthesia    Pt request scopolamine patch   Depression    Diarrhea    last time in 2013   Diverticulosis    Dysrhythmia    tachacardia managed by pcp    GERD (gastroesophageal reflux disease)    Hyperlipidemia    Insomnia    Lumbar herniated disc    L3-4 and L5   Lumbar nerve root impingement    right leg   Migraine    Neuropathy of foot    legs due to djd and spinal stenosis intermittent neuropathy   OA (osteoarthritis)    both hips and spine and left foot   Osteoporosis    Personal history of chemotherapy    oral/on meds for 4 years   Personal history of radiation therapy 2008   had 35 treatments/ last on 2008   PONV (postoperative nausea and vomiting)    extreme ponv uses scopolamine patch   Tubular adenoma of colon    Vitamin D deficiency    Wears glasses     Patient Active Problem List   Diagnosis Date Noted   Acute maxillary sinusitis 07/12/2022   Bipolar disorder (HCC) 12/06/2021   Generalized anxiety disorder 12/06/2021   Community acquired pneumonia 11/30/2021   Cough 11/30/2021   Acute urinary tract infection 09/12/2021   AVN of femur (HCC) 03/30/2019   Major depressive disorder, recurrent  severe without psychotic features (HCC) 05/02/2015   Suicidal ideation    Abdominal pain    Diarrhea     Past Surgical History:  Procedure Laterality Date   BACK SURGERY  08/21/2018   L4-L5 and laminectomy   BREAST EXCISIONAL BIOPSY Right 2008   BREAST LUMPECTOMY  2008   right breast   CARDIAC CATHETERIZATION  2012   no stents/no blockages   COLONOSCOPY     DILATION AND CURETTAGE OF UTERUS  1982   HIP SURGERY Left jan 2005 and oct 2005   left hip x 2 ( pt report that first hip failed, revision was needed)   TOTAL HIP ARTHROPLASTY Right 03/31/2019   Procedure: Right Anterior Total Hip Arthroplasty;  Surgeon: Gean Birchwood, MD;  Location: [facility];  Service: Orthopedics;  Laterality: Right;   WRIST SURGERY  2007   right wrist orif plates and 12 screws still in    OB History   No obstetric history on file.      Home Medications    Prior to Admission medications   Medication Sig Start Date End Date Taking? Authorizing Provider  triamcinolone cream (KENALOG) 0.1 % Apply 1  Application topically 2 (two) times daily. 06/22/23  Yes Laurelai Lepp-Warren, Sadie Haber, NP  aspirin EC 81 MG tablet Take 1 tablet (81 mg total) by mouth daily. Swallow whole. 08/06/22   Maisie Fus, MD  Cholecalciferol 1.25 MG (50000 UT) capsule Take 50,000 Units by mouth daily. Sunday    [provider]  clonazePAM (KLONOPIN) 1 MG tablet Take 1 mg by mouth 2 (two) times daily.    [provider]  dicyclomine (BENTYL) 20 MG tablet Take 1 tablet (20 mg total) by mouth 3 (three) times daily as needed for spasms. 10/09/21   Pyrtle, Carie Caddy, MD  escitalopram (LEXAPRO) 10 MG tablet Take 10 mg by mouth daily.    [provider]  ezetimibe (ZETIA) 10 MG tablet Take 1 tablet (10 mg total) by mouth daily. 08/14/22   Maisie Fus, MD  lamoTRIgine (LAMICTAL) 150 MG tablet Take 150 mg by mouth daily.    [provider]  rosuvastatin (CRESTOR) 20 MG tablet Take 1 tablet (20 mg total) by mouth  every evening. 08/14/22   Maisie Fus, MD  traMADol (ULTRAM) 50 MG tablet Take 50 mg by mouth every 6 (six) hours as needed. 07/13/21   [provider]    Family History Family History  Problem Relation Age of Onset   Colon cancer Mother    Heart attack Father    Bipolar disorder Sister    Colon polyps Sister    Breast cancer Cousin        maternal first cousin   Breast cancer Cousin        maternal first cousin   Heart disease Brother    Colon polyps Brother    Transient ischemic attack Brother    Breast cancer Niece    Esophageal cancer Neg Hx    Stomach cancer Neg Hx    Rectal cancer Neg Hx     Social History Social History   Tobacco Use   Smoking status: Every Day    Current packs/day: 0.00    Average packs/day: 0.5 packs/day for 30.0 years (15.0 ttl pk-yrs)    Types: Cigarettes    Start date: 01/06/1991    Last attempt to quit: 01/05/2021    Years since quitting: 2.4   Smokeless tobacco: Never   Tobacco comments:    smokes 10 cigarettes a day  Vaping Use   Vaping status: Never Used  Substance Use Topics   Alcohol use: No   Drug use: Not Currently     Allergies   Metronidazole, Sulfamethoxazole-trimethoprim, Cefdinir, and Morphine   Review of Systems Review of Systems Per HPI  Physical Exam Triage Vital Signs ED Triage Vitals  Encounter Vitals Group     BP 06/22/23 0845 111/71     Systolic BP Percentile --      Diastolic BP Percentile --      Pulse Rate 06/22/23 0845 70     Resp 06/22/23 0845 18     Temp 06/22/23 0845 98.2 F (36.8 C)     Temp Source 06/22/23 0845 Oral     SpO2 06/22/23 0845 95 %     Weight --      Height --      Head Circumference --      Peak Flow --      Pain Score 06/22/23 0846 2     Pain Loc --      Pain Education --      Exclude from Growth Chart --  No data found.  Updated Vital Signs BP 111/71 (BP Location: Right Arm)   Pulse 70   Temp 98.2 F (36.8 C) (Oral)   Resp 18   SpO2 95%   Visual  Acuity Right Eye Distance:   Left Eye Distance:   Bilateral Distance:    Right Eye Near:   Left Eye Near:    Bilateral Near:     Physical Exam Vitals and nursing note reviewed.  Constitutional:      General: She is not in acute distress.    Appearance: Normal appearance.  HENT:     Head: Normocephalic.     Mouth/Throat:     Mouth: Mucous membranes are moist.  Eyes:     Extraocular Movements: Extraocular movements intact.     Pupils: Pupils are equal, round, and reactive to light.  Pulmonary:     Effort: Pulmonary effort is normal.  Musculoskeletal:     Cervical back: Normal range of motion.  Lymphadenopathy:     Cervical: No cervical adenopathy.  Skin:    General: Skin is warm and dry.     Comments: Erythematous induration noted to the medial aspect of the right forearm approximately 1-1/2 inches below the antecubital.  Area is annular and measures approximately 2 cm in diameter.  There is a central punctum noted, which appears to be "red".  There is no oozing, fluctuance, or drainage present.  Neurological:     General: No focal deficit present.     Mental Status: She is alert and oriented to person, place, and time.  Psychiatric:        Mood and Affect: Mood normal.        Behavior: Behavior normal.      UC Treatments / Results  Labs (all labs ordered are listed, but only abnormal results are displayed) Labs Reviewed - No data to display  EKG   Radiology No results found.  Procedures Procedures (including critical care time)  Medications Ordered in UC Medications - No data to display  Initial Impression / Assessment and Plan / UC Course  I have reviewed the triage vital signs and the nursing notes.  Pertinent labs & imaging results that were available during my care of the patient were reviewed by me and considered in my medical decision making (see chart for details).  The patient is well-appearing, she is in no acute distress, vital signs are  stable.  Suspect a possible insect bite given the central punctum of the induration noted.  Will treat with triamcinolone cream 0.1% to help with itching and inflammation.  Supportive care recommendations were provided and discussed with the patient to include over-the-counter antihistamines, cool compresses to the area, and to avoid scratching or irritating the area.  Patient was advised to monitor the area for worsening, if so, patient was advised to follow-up in this clinic or in the ER for further evaluation.  Patient is in agreement with this plan of care and verbalizes understanding.  All questions were answered.  Patient stable for discharge.  Final Clinical Impressions(s) / UC Diagnoses   Final diagnoses:  Papular urticaria     Discharge Instructions      Apply medication to the affected area as prescribed. May take over-the-counter Zyrtec during the daytime and Benadryl at bedtime to help with itching. Apply cool compresses to the affected area for swelling. Avoid scratching, rubbing, or manipulating the area while symptoms persist. May take over-the-counter Tylenol as needed for pain, fever, or general discomfort. Continue  to monitor the area for worsening.  If you notice that the area becomes more enlarged, begins to spread up or down the right arm, develops foul-smelling drainage, or if you experience new symptoms of fever, chills, or other concerns, please follow-up in this clinic for further evaluation or in the emergency department. Follow-up as needed.     ED Prescriptions     Medication Sig Dispense Auth. Provider   triamcinolone cream (KENALOG) 0.1 % Apply 1 Application topically 2 (two) times daily. 30 g Elinore Shults-Warren, Sadie Haber, NP      PDMP not reviewed this encounter.   Abran Cantor, NP 06/22/23 [phone]

## 2023-06-22 NOTE — Discharge Instructions (Signed)
Apply medication to the affected area as prescribed. May take over-the-counter Zyrtec during the daytime and Benadryl at bedtime to help with itching. Apply cool compresses to the affected area for swelling. Avoid scratching, rubbing, or manipulating the area while symptoms persist. May take over-the-counter Tylenol as needed for pain, fever, or general discomfort. Continue to monitor the area for worsening.  If you notice that the area becomes more enlarged, begins to spread up or down the right arm, develops foul-smelling drainage, or if you experience new symptoms of fever, chills, or other concerns, please follow-up in this clinic for further evaluation or in the emergency department. Follow-up as needed.

## 2023-06-22 NOTE — ED Triage Notes (Signed)
Has a bump on right forearm since yesterday.  States area itches and hurts.  Area red and swollen.

## 2023-07-17 DIAGNOSIS — Z23 Encounter for immunization: Secondary | ICD-10-CM | POA: Diagnosis not present

## 2023-07-17 DIAGNOSIS — F17210 Nicotine dependence, cigarettes, uncomplicated: Secondary | ICD-10-CM | POA: Diagnosis not present

## 2023-07-17 DIAGNOSIS — Z79899 Other long term (current) drug therapy: Secondary | ICD-10-CM | POA: Diagnosis not present

## 2023-07-17 DIAGNOSIS — M5116 Intervertebral disc disorders with radiculopathy, lumbar region: Secondary | ICD-10-CM | POA: Diagnosis not present

## 2023-07-17 DIAGNOSIS — M5442 Lumbago with sciatica, left side: Secondary | ICD-10-CM | POA: Diagnosis not present

## 2023-07-17 DIAGNOSIS — M51369 Other intervertebral disc degeneration, lumbar region without mention of lumbar back pain or lower extremity pain: Secondary | ICD-10-CM | POA: Diagnosis not present

## 2023-07-17 DIAGNOSIS — Z7182 Exercise counseling: Secondary | ICD-10-CM | POA: Diagnosis not present

## 2023-07-17 DIAGNOSIS — Z713 Dietary counseling and surveillance: Secondary | ICD-10-CM | POA: Diagnosis not present

## 2023-08-13 ENCOUNTER — Other Ambulatory Visit: Payer: Self-pay | Admitting: Internal Medicine

## 2023-09-01 DIAGNOSIS — H02532 Eyelid retraction right lower eyelid: Secondary | ICD-10-CM | POA: Diagnosis not present

## 2023-09-01 DIAGNOSIS — H57813 Brow ptosis, bilateral: Secondary | ICD-10-CM | POA: Diagnosis not present

## 2023-09-01 DIAGNOSIS — H02834 Dermatochalasis of left upper eyelid: Secondary | ICD-10-CM | POA: Diagnosis not present

## 2023-09-01 DIAGNOSIS — H02831 Dermatochalasis of right upper eyelid: Secondary | ICD-10-CM | POA: Diagnosis not present

## 2023-09-01 DIAGNOSIS — H02413 Mechanical ptosis of bilateral eyelids: Secondary | ICD-10-CM | POA: Diagnosis not present

## 2023-09-01 DIAGNOSIS — H02423 Myogenic ptosis of bilateral eyelids: Secondary | ICD-10-CM | POA: Diagnosis not present

## 2023-09-01 DIAGNOSIS — H0279 Other degenerative disorders of eyelid and periocular area: Secondary | ICD-10-CM | POA: Diagnosis not present

## 2023-09-01 DIAGNOSIS — H02422 Myogenic ptosis of left eyelid: Secondary | ICD-10-CM | POA: Diagnosis not present

## 2023-09-01 DIAGNOSIS — H02535 Eyelid retraction left lower eyelid: Secondary | ICD-10-CM | POA: Diagnosis not present

## 2023-09-01 DIAGNOSIS — H02421 Myogenic ptosis of right eyelid: Secondary | ICD-10-CM | POA: Diagnosis not present

## 2023-10-10 DIAGNOSIS — E782 Mixed hyperlipidemia: Secondary | ICD-10-CM | POA: Diagnosis not present

## 2023-10-10 DIAGNOSIS — R7303 Prediabetes: Secondary | ICD-10-CM | POA: Diagnosis not present

## 2023-10-10 DIAGNOSIS — E559 Vitamin D deficiency, unspecified: Secondary | ICD-10-CM | POA: Diagnosis not present

## 2023-10-11 LAB — LAB REPORT - SCANNED
A1c: 5.8
Albumin, Urine POC: 7
Creatinine, POC: 173.6 mg/dL
EGFR: 89
Microalb Creat Ratio: 4
TSH: 1.95 (ref 0.41–5.90)

## 2023-10-16 DIAGNOSIS — M5442 Lumbago with sciatica, left side: Secondary | ICD-10-CM | POA: Diagnosis not present

## 2023-10-16 DIAGNOSIS — Z96642 Presence of left artificial hip joint: Secondary | ICD-10-CM | POA: Diagnosis not present

## 2023-10-16 DIAGNOSIS — R000 Tachycardia, unspecified: Secondary | ICD-10-CM | POA: Diagnosis not present

## 2023-10-16 DIAGNOSIS — M13872 Other specified arthritis, left ankle and foot: Secondary | ICD-10-CM | POA: Diagnosis not present

## 2023-10-16 DIAGNOSIS — I700 Atherosclerosis of aorta: Secondary | ICD-10-CM | POA: Diagnosis not present

## 2023-10-16 DIAGNOSIS — I2510 Atherosclerotic heart disease of native coronary artery without angina pectoris: Secondary | ICD-10-CM | POA: Diagnosis not present

## 2023-10-16 DIAGNOSIS — E782 Mixed hyperlipidemia: Secondary | ICD-10-CM | POA: Diagnosis not present

## 2023-10-16 DIAGNOSIS — G43009 Migraine without aura, not intractable, without status migrainosus: Secondary | ICD-10-CM | POA: Diagnosis not present

## 2023-10-16 DIAGNOSIS — M51369 Other intervertebral disc degeneration, lumbar region without mention of lumbar back pain or lower extremity pain: Secondary | ICD-10-CM | POA: Diagnosis not present

## 2023-10-16 DIAGNOSIS — F17201 Nicotine dependence, unspecified, in remission: Secondary | ICD-10-CM | POA: Diagnosis not present

## 2023-10-30 IMAGING — DX DG CHEST 1V PORT
1 series · 1 of 1 positions shown · non-contrast
Comparison: Chest x-ray dated March 30, 2019.

CLINICAL DATA: Cough and congestion for 2 days.

EXAM:
PORTABLE CHEST 1 VIEW

[chest]
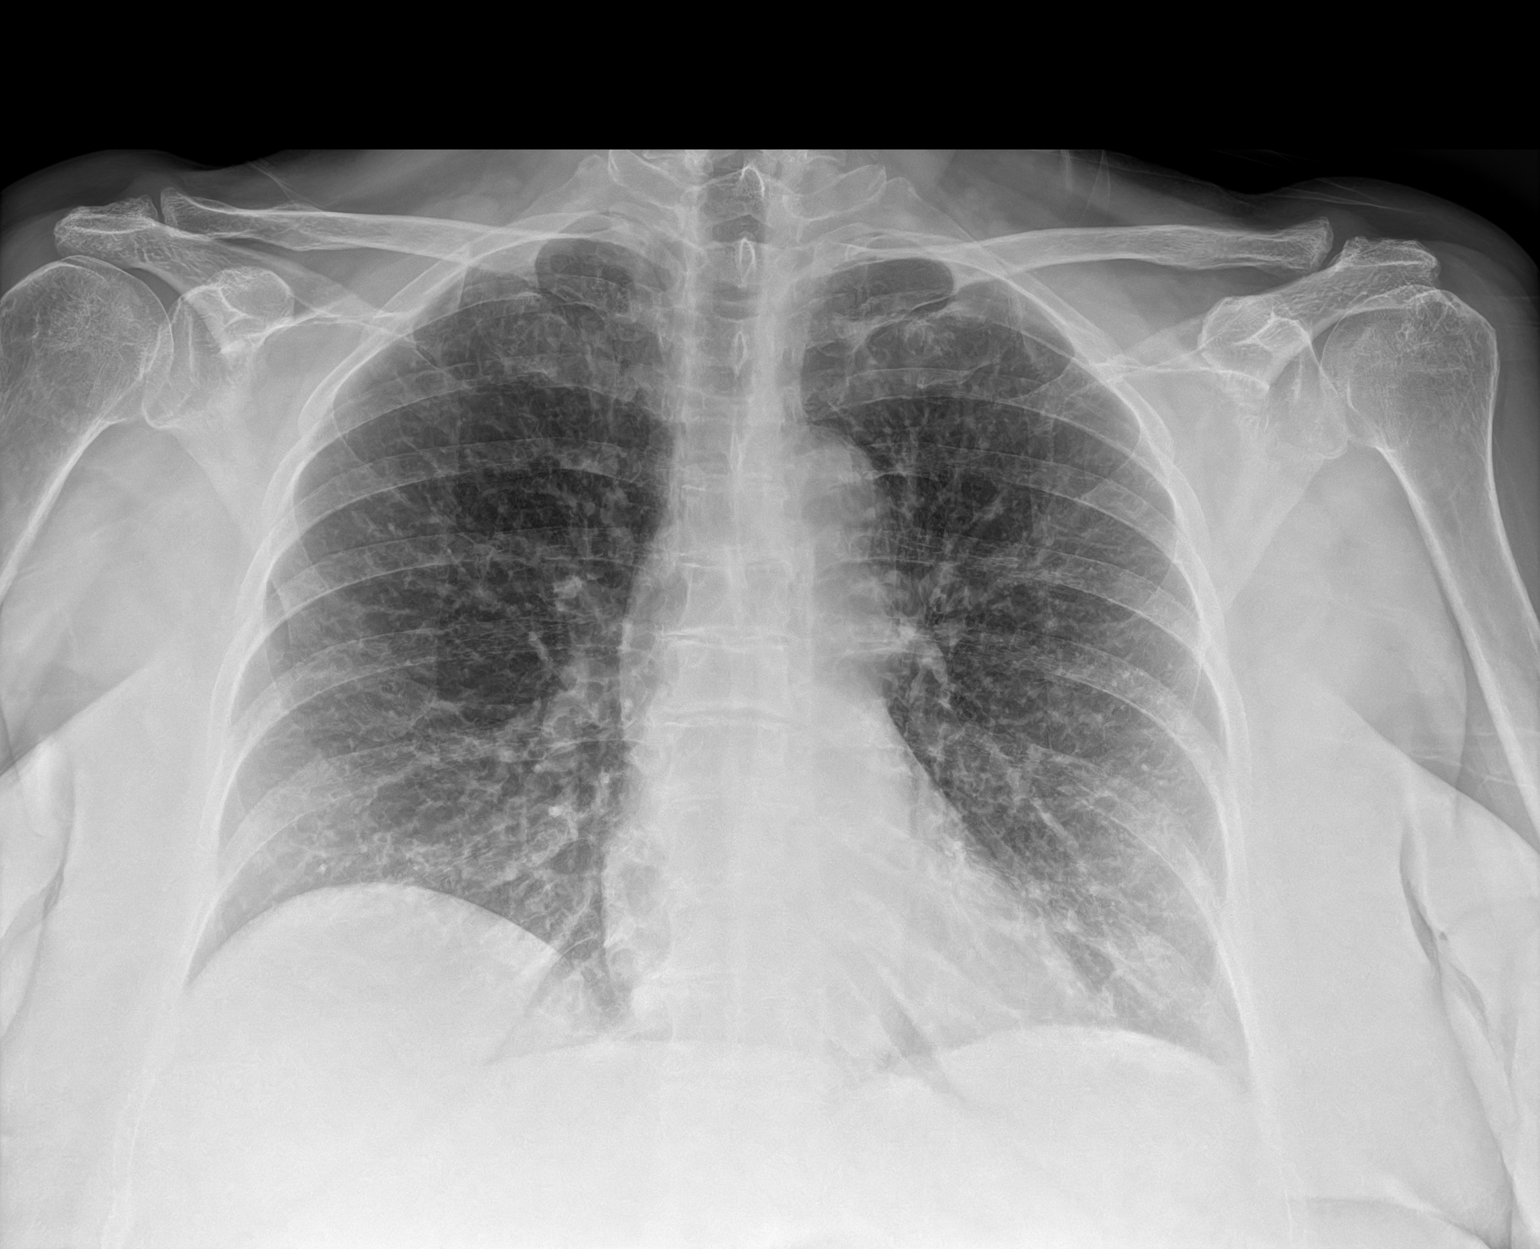

[1 of 1 positions shown; findings below may reference images not displayed]

FINDINGS: The heart size and mediastinal contours are within normal limits.
Normal pulmonary vascularity. New coarsened interstitial markings,
likely smoking-related. No focal consolidation, pleural effusion, or
pneumothorax. No acute osseous abnormality.
IMPRESSION: 1. No active disease.

## 2023-12-22 DIAGNOSIS — Z01818 Encounter for other preprocedural examination: Secondary | ICD-10-CM | POA: Diagnosis not present

## 2023-12-22 DIAGNOSIS — H02422 Myogenic ptosis of left eyelid: Secondary | ICD-10-CM | POA: Diagnosis not present

## 2023-12-22 DIAGNOSIS — H57813 Brow ptosis, bilateral: Secondary | ICD-10-CM | POA: Diagnosis not present

## 2023-12-22 DIAGNOSIS — H0279 Other degenerative disorders of eyelid and periocular area: Secondary | ICD-10-CM | POA: Diagnosis not present

## 2023-12-22 DIAGNOSIS — H02834 Dermatochalasis of left upper eyelid: Secondary | ICD-10-CM | POA: Diagnosis not present

## 2023-12-22 DIAGNOSIS — H02831 Dermatochalasis of right upper eyelid: Secondary | ICD-10-CM | POA: Diagnosis not present

## 2024-01-28 DIAGNOSIS — M51369 Other intervertebral disc degeneration, lumbar region without mention of lumbar back pain or lower extremity pain: Secondary | ICD-10-CM | POA: Diagnosis not present

## 2024-01-28 DIAGNOSIS — L659 Nonscarring hair loss, unspecified: Secondary | ICD-10-CM | POA: Diagnosis not present

## 2024-01-28 DIAGNOSIS — H02539 Eyelid retraction unspecified eye, unspecified lid: Secondary | ICD-10-CM | POA: Diagnosis not present

## 2024-01-28 DIAGNOSIS — N390 Urinary tract infection, site not specified: Secondary | ICD-10-CM | POA: Diagnosis not present

## 2024-02-16 DIAGNOSIS — H53483 Generalized contraction of visual field, bilateral: Secondary | ICD-10-CM | POA: Diagnosis not present

## 2024-03-16 ENCOUNTER — Telehealth: Payer: Self-pay | Admitting: Cardiovascular Disease

## 2024-03-16 NOTE — Telephone Encounter (Signed)
 Patient wants to get orders for lab work prior to appointment on 6/16.

## 2024-03-16 NOTE — Telephone Encounter (Signed)
 Called spoke to patient .  RN informed patient she does not need to have any labs prior to 03/22/24 appointment.  Epic - l-there are labs scanned into the lab section from 10/11/2023  Labs are available- Lipids, CMP,Vit-D    Patient states she will be having  annual labs next month - July 2025 by Dr Izetta Marshall (primary)   Patient verbalized understanding.

## 2024-03-22 ENCOUNTER — Encounter: Payer: Self-pay | Admitting: Cardiovascular Disease

## 2024-03-22 ENCOUNTER — Ambulatory Visit: Attending: Cardiovascular Disease | Admitting: Cardiovascular Disease

## 2024-03-22 VITALS — BP 94/60 | HR 62 | Ht 65.0 in | Wt 177.0 lb

## 2024-03-22 DIAGNOSIS — R002 Palpitations: Secondary | ICD-10-CM | POA: Insufficient documentation

## 2024-03-22 DIAGNOSIS — E782 Mixed hyperlipidemia: Secondary | ICD-10-CM

## 2024-03-22 DIAGNOSIS — E785 Hyperlipidemia, unspecified: Secondary | ICD-10-CM | POA: Insufficient documentation

## 2024-03-22 DIAGNOSIS — R931 Abnormal findings on diagnostic imaging of heart and coronary circulation: Secondary | ICD-10-CM

## 2024-03-22 DIAGNOSIS — I2510 Atherosclerotic heart disease of native coronary artery without angina pectoris: Secondary | ICD-10-CM | POA: Insufficient documentation

## 2024-03-22 NOTE — Assessment & Plan Note (Signed)
 History of palpitations on propranolol 3 times daily prescribed by her PCP.

## 2024-03-22 NOTE — Assessment & Plan Note (Signed)
 Coronary CTA performed because of chest pain 08/01/2022 revealed a coronary calcium  score of 251 with negative FFR analysis and nonobstructive CAD.  She currently denies chest pain.  She has a goal for secondary prevention.

## 2024-03-22 NOTE — Assessment & Plan Note (Signed)
 History of hyperlipidemia on statin therapy with lipid profile performed 10/10/2023 revealing total cholesterol 140, LDL 72 and HDL 49.  She is at goal for secondary prevention.

## 2024-03-22 NOTE — Progress Notes (Signed)
 03/22/2024 Sandra Conrad   1956/07/13  [id number]  Primary Physician Omie Bickers, MD Primary Cardiologist: Avanell Leigh MD Bennye Bravo, [place]  HPI:  Sandra Conrad is a 68 y.o. 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  She is previously a cardiology patient of Dr. Amanda Jungling.  I am assuming her care in Dr. Junita Oliva absence.  Risk factors include 40 pack years of tobacco abuse having cut back 12 years ago now smoking 5 cigarettes a day.  She has treated hyperlipidemia.  She does have palpitations on propranolol.  Her father had heart attack at an early age and brother had bypass surgery.  She has never had a heart attack or stroke.  She denies chest pain or shortness of breath.  Her other medical problems include breast cancer 15 years ago, diverticulosis, DJD, spinal stenosis, hip replacement x 3.   Current Meds  Medication Sig   aspirin  EC 81 MG tablet Take 1 tablet (81 mg total) by mouth daily. Swallow whole.   Cholecalciferol 1.25 MG (50000 UT) capsule Take 50,000 Units by mouth daily. Sunday   clonazePAM  (KLONOPIN ) 1 MG tablet Take 1 mg by mouth 2 (two) times daily.   dicyclomine  (BENTYL ) 20 MG tablet Take 1 tablet (20 mg total) by mouth 3 (three) times daily as needed for spasms.   escitalopram  (LEXAPRO ) 10 MG tablet Take 10 mg by mouth daily.   ezetimibe  (ZETIA ) 10 MG tablet TAKE 1 TABLET(10 MG) BY MOUTH DAILY   gabapentin  (NEURONTIN ) 300 MG capsule Take 300 mg by mouth 2 (two) times daily.   lamoTRIgine  (LAMICTAL ) 150 MG tablet Take 150 mg by mouth daily.   propranolol (INDERAL) 20 MG tablet Take 20 mg by mouth 2 (two) times daily.   rosuvastatin  (CRESTOR ) 20 MG tablet TAKE 1 TABLET(20 MG) BY MOUTH EVERY EVENING   traMADol (ULTRAM) 50 MG tablet Take 50 mg by mouth every 6 (six) hours as needed.     Allergies  Allergen Reactions   Metronidazole  Nausea And  Vomiting and Other (See Comments)   Sulfamethoxazole-Trimethoprim Other (See Comments)    nausea   Cefdinir Nausea Only and Other (See Comments)    Abdominal pain    Morphine  Rash    Social History   Socioeconomic History   Marital status: Divorced    Spouse name: Not on file   Number of children: Not on file   Years of education: Not on file   Highest education level: Not on file  Occupational History   Not on file  Tobacco Use   Smoking status: Every Day    Current packs/day: 0.00    Average packs/day: 0.5 packs/day for 30.0 years (15.0 ttl pk-yrs)    Types: Cigarettes    Start date: 01/06/1991    Last attempt to quit: 01/05/2021    Years since quitting: 3.2   Smokeless tobacco: Never   Tobacco comments:    smokes 10 cigarettes a day  Vaping Use   Vaping status: Never Used  Substance and Sexual Activity   Alcohol use: No   Drug use: Not Currently   Sexual activity: Not Currently    Birth control/protection: Post-menopausal  Other Topics Concern   Not on file  Social History Narrative   Not on file   Social Drivers of Health   Financial Resource Strain: Not on file  Food Insecurity: Not  on file  Transportation Needs: Not on file  Physical Activity: Not on file  Stress: Not on file  Social Connections: Not on file  Intimate Partner Violence: Not on file     Review of Systems: General: negative for chills, fever, night sweats or weight changes.  Cardiovascular: negative for chest pain, dyspnea on exertion, edema, orthopnea, palpitations, paroxysmal nocturnal dyspnea or shortness of breath Dermatological: negative for rash Respiratory: negative for cough or wheezing Urologic: negative for hematuria Abdominal: negative for nausea, vomiting, diarrhea, bright red blood per rectum, melena, or hematemesis Neurologic: negative for visual changes, syncope, or dizziness All other systems reviewed and are otherwise negative except as noted above.    Blood pressure  94/60, pulse 62, height 5' 5 (1.651 m), weight 177 lb (80.3 kg), SpO2 97%.  General appearance: alert and no distress Neck: no adenopathy, no carotid bruit, no JVD, supple, symmetrical, trachea midline, and thyroid  not enlarged, symmetric, no tenderness/mass/nodules Lungs: clear to auscultation bilaterally Heart: regular rate and rhythm, S1, S2 normal, no murmur, click, rub or gallop Extremities: extremities normal, atraumatic, no cyanosis or edema Pulses: 2+ and symmetric Skin: Skin color, texture, turgor normal. No rashes or lesions Neurologic: Grossly normal  EKG EKG Interpretation Date/Time:  Monday March 22 2024 13:41:04 EDT Ventricular Rate:  62 PR Interval:  170 QRS Duration:  84 QT Interval:  392 QTC Calculation: 397 R Axis:   61  Text Interpretation: Normal sinus rhythm Septal infarct , age undetermined When compared with ECG of 10-May-2023 10:58, PREVIOUS ECG IS PRESENT Confirmed by Lauro Portal [phone]) on 03/22/2024 1:46:29 PM    ASSESSMENT AND PLAN:   Hyperlipidemia History of hyperlipidemia on statin therapy with lipid profile performed 10/10/2023 revealing total cholesterol 140, LDL 72 and HDL 49.  She is at goal for secondary prevention.  Palpitations History of palpitations on propranolol 3 times daily prescribed by her PCP.  Coronary artery disease Coronary CTA performed because of chest pain 08/01/2022 revealed a coronary calcium  score of 251 with negative FFR analysis and nonobstructive CAD.  She currently denies chest pain.  She has a goal for secondary prevention.     Avanell Leigh MD FACP,FACC,FAHA, [facility] 03/22/2024 1:59 PM

## 2024-03-22 NOTE — Patient Instructions (Signed)

## 2024-04-01 ENCOUNTER — Telehealth: Payer: Self-pay

## 2024-04-01 NOTE — Telephone Encounter (Signed)
   Pre-operative Risk Assessment    Patient Name: Sandra Conrad  DOB: 03/08/1956 MRN: [id number]   Date of last office visit: 03/22/24 DORN LESCHES, MD Date of next office visit: NONE   Request for Surgical Clearance    Procedure:  BILATERAL UPPER EYELID BLEPHAROPTOSIS REPAIR STAGE 2: LEFT UPPER EYELID BLEPHAROPTOSIS REVISION  Date of Surgery:  Clearance 04/27/24                                Surgeon:  DR OSBORNE PONDER Surgeon's Group or Practice Name:  [facility] Phone number:  [phone] Fax number:  [phone]   Type of Clearance Requested:   - Medical  - Pharmacy:  Hold Aspirin      Type of Anesthesia:  Not Indicated   Additional requests/questions:    SignedLucie DELENA Ku   04/01/2024, 12:05 PM

## 2024-04-02 NOTE — Telephone Encounter (Signed)
   Name: Sandra Conrad  DOB: 08/21/1956  MRN: [id number]   Primary Cardiologist: Alvan Ronal BRAVO, MD (Inactive)  Chart reviewed as part of pre-operative protocol coverage. Patient was contacted 04/02/2024 in reference to pre-operative risk assessment for pending surgery as outlined below.  Sandra Conrad was last seen on 03/22/2024 by Dr. Court.  Since that day, Sandra Conrad has done well without cardiac symptoms.  Therefore, based on ACC/AHA guidelines, the patient would be at acceptable risk for the planned procedure without further cardiovascular testing.   Per office protocol, if patient is without any new symptoms or concerns at the time of their virtual visit, she may hold aspirin  for 7 days prior to procedure. Please resume asprin as soon as possible postprocedure, at the discretion of the surgeon.  These instructions were discussed with her by phone and she verbalized understanding.  The patient was advised that if she develops new symptoms prior to surgery to contact our office to arrange for a follow-up visit, and she verbalized understanding.  I will route this recommendation to the requesting party via Epic fax function and remove from pre-op pool. Please call with questions.  Lamarr Satterfield, NP 04/02/2024, 10:59 AM

## 2024-04-12 DIAGNOSIS — E559 Vitamin D deficiency, unspecified: Secondary | ICD-10-CM | POA: Diagnosis not present

## 2024-04-12 DIAGNOSIS — E782 Mixed hyperlipidemia: Secondary | ICD-10-CM | POA: Diagnosis not present

## 2024-04-12 DIAGNOSIS — R7303 Prediabetes: Secondary | ICD-10-CM | POA: Diagnosis not present

## 2024-04-14 DIAGNOSIS — M13872 Other specified arthritis, left ankle and foot: Secondary | ICD-10-CM | POA: Diagnosis not present

## 2024-04-14 DIAGNOSIS — M5136 Other intervertebral disc degeneration, lumbar region with discogenic back pain only: Secondary | ICD-10-CM | POA: Diagnosis not present

## 2024-04-14 DIAGNOSIS — M549 Dorsalgia, unspecified: Secondary | ICD-10-CM | POA: Diagnosis not present

## 2024-04-14 DIAGNOSIS — G43009 Migraine without aura, not intractable, without status migrainosus: Secondary | ICD-10-CM | POA: Diagnosis not present

## 2024-04-14 DIAGNOSIS — H02539 Eyelid retraction unspecified eye, unspecified lid: Secondary | ICD-10-CM | POA: Diagnosis not present

## 2024-04-14 DIAGNOSIS — M5430 Sciatica, unspecified side: Secondary | ICD-10-CM | POA: Diagnosis not present

## 2024-04-14 DIAGNOSIS — L659 Nonscarring hair loss, unspecified: Secondary | ICD-10-CM | POA: Diagnosis not present

## 2024-04-14 DIAGNOSIS — I2510 Atherosclerotic heart disease of native coronary artery without angina pectoris: Secondary | ICD-10-CM | POA: Diagnosis not present

## 2024-04-14 DIAGNOSIS — M51369 Other intervertebral disc degeneration, lumbar region without mention of lumbar back pain or lower extremity pain: Secondary | ICD-10-CM | POA: Diagnosis not present

## 2024-04-14 DIAGNOSIS — E782 Mixed hyperlipidemia: Secondary | ICD-10-CM | POA: Diagnosis not present

## 2024-04-15 ENCOUNTER — Other Ambulatory Visit: Payer: Self-pay | Admitting: Internal Medicine

## 2024-04-15 DIAGNOSIS — Z1231 Encounter for screening mammogram for malignant neoplasm of breast: Secondary | ICD-10-CM

## 2024-05-19 ENCOUNTER — Ambulatory Visit

## 2024-05-23 ENCOUNTER — Other Ambulatory Visit: Payer: Self-pay

## 2024-05-23 ENCOUNTER — Emergency Department (HOSPITAL_BASED_OUTPATIENT_CLINIC_OR_DEPARTMENT_OTHER): Admitting: Radiology

## 2024-05-23 ENCOUNTER — Encounter (HOSPITAL_BASED_OUTPATIENT_CLINIC_OR_DEPARTMENT_OTHER): Payer: Self-pay

## 2024-05-23 ENCOUNTER — Emergency Department (HOSPITAL_BASED_OUTPATIENT_CLINIC_OR_DEPARTMENT_OTHER)
Admission: EM | Admit: 2024-05-23 | Discharge: 2024-05-23 | Disposition: A | Discharge status: Home / Self Care | Attending: Emergency Medicine | Admitting: Emergency Medicine

## 2024-05-23 DIAGNOSIS — R059 Cough, unspecified: Secondary | ICD-10-CM | POA: Diagnosis present

## 2024-05-23 DIAGNOSIS — Z79899 Other long term (current) drug therapy: Secondary | ICD-10-CM | POA: Insufficient documentation

## 2024-05-23 DIAGNOSIS — R0602 Shortness of breath: Secondary | ICD-10-CM | POA: Diagnosis not present

## 2024-05-23 DIAGNOSIS — Z87891 Personal history of nicotine dependence: Secondary | ICD-10-CM | POA: Diagnosis not present

## 2024-05-23 DIAGNOSIS — Z7982 Long term (current) use of aspirin: Secondary | ICD-10-CM | POA: Diagnosis not present

## 2024-05-23 DIAGNOSIS — J209 Acute bronchitis, unspecified: Secondary | ICD-10-CM | POA: Insufficient documentation

## 2024-05-23 LAB — CBC
HCT: 40.8 % (ref 36.0–46.0)
Hemoglobin: 13.4 g/dL (ref 12.0–15.0)
MCH: 32.7 pg (ref 26.0–34.0)
MCHC: 32.8 g/dL (ref 30.0–36.0)
MCV: 99.5 fL (ref 80.0–100.0)
Platelets: 338 K/uL (ref 150–400)
RBC: 4.1 MIL/uL (ref 3.87–5.11)
RDW: 13.2 % (ref 11.5–15.5)
WBC: 10.4 K/uL (ref 4.0–10.5)
nRBC: 0 % (ref 0.0–0.2)

## 2024-05-23 LAB — BASIC METABOLIC PANEL WITH GFR
Anion gap: 13 (ref 5–15)
BUN: 16 mg/dL (ref 8–23)
CO2: 23 mmol/L (ref 22–32)
Calcium: 9.5 mg/dL (ref 8.9–10.3)
Chloride: 101 mmol/L (ref 98–111)
Creatinine, Ser: 0.71 mg/dL (ref 0.44–1.00)
GFR, Estimated: >60 mL/min (ref >60)
Glucose, Bld: 105 mg/dL — ABNORMAL HIGH (ref 70–99)
Potassium: 4.2 mmol/L (ref 3.5–5.1)
Sodium: 137 mmol/L (ref 135–145)

## 2024-05-23 LAB — RESP PANEL BY RT-PCR (RSV, FLU A&B, COVID)  RVPGX2
Influenza A by PCR: NEGATIVE
Influenza B by PCR: NEGATIVE
Resp Syncytial Virus by PCR: NEGATIVE
SARS Coronavirus 2 by RT PCR: NEGATIVE

## 2024-05-23 MED ORDER — METHYLPREDNISOLONE SODIUM SUCC 125 MG IJ SOLR
125.0000 mg | Freq: Once | INTRAMUSCULAR | Status: AC
  Administered 2024-05-23: 125 mg via INTRAVENOUS
  Filled 2024-05-23: qty 2

## 2024-05-23 MED ORDER — ALBUTEROL SULFATE (2.5 MG/3ML) 0.083% IN NEBU
5.0000 mg | INHALATION_SOLUTION | Freq: Once | RESPIRATORY_TRACT | Status: AC
  Administered 2024-05-23: 5 mg via RESPIRATORY_TRACT
  Filled 2024-05-23: qty 6

## 2024-05-23 MED ORDER — DM-GUAIFENESIN ER 30-600 MG PO TB12: 1.0000 | ORAL_TABLET | Freq: Once | ORAL | Status: DC

## 2024-05-23 MED ORDER — GUAIFENESIN-DM 100-10 MG/5ML PO SYRP: 5.0000 mL | ORAL_SOLUTION | ORAL | 0 refills | Status: AC | PRN

## 2024-05-23 MED ORDER — ALBUTEROL SULFATE (2.5 MG/3ML) 0.083% IN NEBU: 5.0000 mg | INHALATION_SOLUTION | Freq: Once | RESPIRATORY_TRACT | Status: AC

## 2024-05-23 MED ORDER — ALBUTEROL SULFATE (2.5 MG/3ML) 0.083% IN NEBU
INHALATION_SOLUTION | RESPIRATORY_TRACT | Status: AC
  Administered 2024-05-23: 5 mg via RESPIRATORY_TRACT
  Filled 2024-05-23: qty 6

## 2024-05-23 MED ORDER — GUAIFENESIN-DM 100-10 MG/5ML PO SYRP
15.0000 mL | ORAL_SOLUTION | Freq: Once | ORAL | Status: AC
  Administered 2024-05-23: 15 mL via ORAL
  Filled 2024-05-23: qty 15

## 2024-05-23 MED ORDER — PREDNISONE 20 MG PO TABS: ORAL_TABLET | ORAL | 0 refills | Status: AC

## 2024-05-23 MED ORDER — IPRATROPIUM-ALBUTEROL 0.5-2.5 (3) MG/3ML IN SOLN
3.0000 mL | Freq: Once | RESPIRATORY_TRACT | Status: AC
  Administered 2024-05-23: 3 mL via RESPIRATORY_TRACT
  Filled 2024-05-23: qty 3

## 2024-05-23 MED ORDER — ALBUTEROL SULFATE HFA 108 (90 BASE) MCG/ACT IN AERS: 2.0000 | INHALATION_SPRAY | RESPIRATORY_TRACT | 0 refills | Status: AC | PRN

## 2024-05-23 MED ORDER — AZITHROMYCIN 250 MG PO TABS: ORAL_TABLET | ORAL | 0 refills | Status: AC

## 2024-05-23 MED ORDER — GUAIFENESIN-DM 100-10 MG/5ML PO SYRP: 5.0000 mL | ORAL_SOLUTION | ORAL | Status: DC | PRN

## 2024-05-23 MED ORDER — ALBUTEROL SULFATE (2.5 MG/3ML) 0.083% IN NEBU
2.5000 mg | INHALATION_SOLUTION | Freq: Once | RESPIRATORY_TRACT | Status: AC
  Administered 2024-05-23: 2.5 mg via RESPIRATORY_TRACT
  Filled 2024-05-23: qty 3

## 2024-05-23 NOTE — ED Provider Notes (Signed)
 [place] [facility] AT [facility] Provider Note   CSN: [id number] Arrival date & time: 05/23/24  1214     Patient presents with: Shortness of Breath and URI   Sandra Conrad is a 68 y.o. female.   Patient with history of cigarette smoking, albuterol  use as needed at home presents with worsening congestion cough shortness of breath and wheezing since last night.  Patient is oxygen saturation 89%.  No fevers.  No heart failure history no home oxygen.  The history is provided by the patient.  Shortness of Breath Associated symptoms: cough   Associated symptoms: no abdominal pain, no chest pain, no fever, no headaches, no neck pain, no rash and no vomiting   URI Presenting symptoms: congestion and cough   Presenting symptoms: no fever   Associated symptoms: no headaches and no neck pain        Prior to Admission medications   Medication Sig Start Date End Date Taking? Authorizing Provider  albuterol  (VENTOLIN  HFA) 108 (90 Base) MCG/ACT inhaler Inhale 2 puffs into the lungs every 3 (three) hours as needed for wheezing or shortness of breath. 05/23/24  Yes Tonia Chew, MD  azithromycin  (ZITHROMAX  Z-PAK) 250 MG tablet 2 po day one, then 1 daily x 4 days 05/23/24  Yes Obed Samek, MD  guaiFENesin -dextromethorphan  (ROBITUSSIN DM) 100-10 MG/5ML syrup Take 5 mLs by mouth every 4 (four) hours as needed for cough. 05/23/24  Yes Tonia Chew, MD  predniSONE  (DELTASONE ) 20 MG tablet 2 tabs po daily x 5 days 05/23/24  Yes Arjen Deringer, MD  aspirin  EC 81 MG tablet Take 1 tablet (81 mg total) by mouth daily. Swallow whole. 08/06/22   Alvan Ronal BRAVO, MD  Cholecalciferol 1.25 MG (50000 UT) capsule Take 50,000 Units by mouth daily. Sunday    [provider]  clonazePAM  (KLONOPIN ) 1 MG tablet Take 1 mg by mouth 2 (two) times daily.    [provider]  dicyclomine  (BENTYL ) 20 MG tablet Take 1 tablet (20 mg total) by mouth 3 (three) times daily as needed for  spasms. 10/09/21   Pyrtle, Gordy HERO, MD  escitalopram  (LEXAPRO ) 10 MG tablet Take 10 mg by mouth daily.    [provider]  ezetimibe  (ZETIA ) 10 MG tablet TAKE 1 TABLET(10 MG) BY MOUTH DAILY 08/13/23   Alvan Ronal BRAVO, MD  gabapentin  (NEURONTIN ) 300 MG capsule Take 300 mg by mouth 2 (two) times daily.    [provider]  lamoTRIgine  (LAMICTAL ) 150 MG tablet Take 150 mg by mouth daily.    [provider]  propranolol (INDERAL) 20 MG tablet Take 20 mg by mouth 2 (two) times daily. 01/19/24   [provider]  rosuvastatin  (CRESTOR ) 20 MG tablet TAKE 1 TABLET(20 MG) BY MOUTH EVERY EVENING 08/13/23   Williams, Evan, PA-C  traMADol (ULTRAM) 50 MG tablet Take 50 mg by mouth every 6 (six) hours as needed. 07/13/21   [provider]  triamcinolone  cream (KENALOG ) 0.1 % Apply 1 Application topically 2 (two) times daily. Patient not taking: Reported on 03/22/2024 06/22/23   Leath-Warren, Etta JINNY, NP    Allergies: Metronidazole , Sulfamethoxazole-trimethoprim, Cefdinir, and Morphine     Review of Systems  Constitutional:  Negative for chills and fever.  HENT:  Positive for congestion.   Eyes:  Negative for visual disturbance.  Respiratory:  Positive for cough and shortness of breath.   Cardiovascular:  Negative for chest pain.  Gastrointestinal:  Negative for abdominal pain and vomiting.  Genitourinary:  Negative for dysuria and flank pain.  Musculoskeletal:  Negative for back pain, neck pain and neck stiffness.  Skin:  Negative for rash.  Neurological:  Negative for light-headedness and headaches.    Updated Vital Signs BP 126/70   Pulse 80   Temp 97.7 F (36.5 C)   Resp 18   Ht 5' 6 (1.676 m)   Wt 78 kg   SpO2 96%   BMI 27.76 kg/m   Physical Exam Vitals and nursing note reviewed.  Constitutional:      General: She is not in acute distress.    Appearance: She is well-developed.  HENT:     Head: Normocephalic and atraumatic.     Mouth/Throat:      Mouth: Mucous membranes are moist.  Eyes:     General:        Right eye: No discharge.        Left eye: No discharge.     Conjunctiva/sclera: Conjunctivae normal.  Neck:     Trachea: No tracheal deviation.  Cardiovascular:     Rate and Rhythm: Normal rate and regular rhythm.     Heart sounds: No murmur heard. Pulmonary:     Effort: Pulmonary effort is normal.     Breath sounds: Decreased breath sounds, wheezing, rhonchi and rales present.  Abdominal:     General: There is no distension.     Palpations: Abdomen is soft.     Tenderness: There is no abdominal tenderness. There is no guarding.  Musculoskeletal:     Cervical back: Normal range of motion and neck supple. No rigidity.  Skin:    General: Skin is warm.     Capillary Refill: Capillary refill takes less than 2 seconds.     Findings: No rash.  Neurological:     General: No focal deficit present.     Mental Status: She is alert.     Cranial Nerves: No cranial nerve deficit.  Psychiatric:        Mood and Affect: Mood normal.     (all labs ordered are listed, but only abnormal results are displayed) Labs Reviewed  BASIC METABOLIC PANEL WITH GFR - Abnormal; Notable for the following components:      Result Value   Glucose, Bld 105 (*)    All other components within normal limits  RESP PANEL BY RT-PCR (RSV, FLU A&B, COVID)  [id number]  CBC    EKG: EKG Interpretation Date/Time:  Sunday May 23 2024 12:25:22 EDT Ventricular Rate:  81 PR Interval:  162 QRS Duration:  78 QT Interval:  370 QTC Calculation: 429 R Axis:   78  Text Interpretation: Normal sinus rhythm Normal ECG When compared with ECG of 22-Mar-2024 13:41, No significant change was found Confirmed by Tonia Chew [phone]) on 05/23/2024 12:40:12 PM  Radiology: [facility] Chest 2 View Result Date: 05/23/2024 CLINICAL DATA:  SOB EXAM: CHEST - 2 VIEW COMPARISON:  Chest x-ray 04/03/2023, chest x-ray 03/30/2019 FINDINGS: The heart and mediastinal contours are within  normal limits. No focal consolidation. No pulmonary edema. No pleural effusion. No pneumothorax. No acute osseous abnormality. IMPRESSION: No active cardiopulmonary disease. Electronically Signed   By: Morgane  Naveau M.D.   On: 05/23/2024 13:05     .Critical Care  Performed by: Tonia Chew, MD Authorized by: Tonia Chew, MD   Critical care provider statement:    Critical care time (minutes):  30   Critical care start time:  05/23/2024 1:00 PM   Critical care end time:  05/23/2024  1:30 PM   Critical care time was exclusive of:  Separately billable procedures and treating other patients and teaching time   Critical care was necessary to treat or prevent imminent or life-threatening deterioration of the following conditions:  Respiratory failure   Critical care was time spent personally by me on the following activities:  Ordering and review of laboratory studies, ordering and review of radiographic studies, pulse oximetry and re-evaluation of patient's condition    Medications Ordered in the ED  guaiFENesin -dextromethorphan  (ROBITUSSIN DM) 100-10 MG/5ML syrup 5 mL (has no administration in time range)  ipratropium-albuterol  (DUONEB) 0.5-2.5 (3) MG/3ML nebulizer solution 3 mL (3 mLs Nebulization Given 05/23/24 1236)  albuterol  (PROVENTIL ) (2.5 MG/3ML) 0.083% nebulizer solution 2.5 mg (2.5 mg Nebulization Given 05/23/24 1236)  albuterol  (PROVENTIL ) (2.5 MG/3ML) 0.083% nebulizer solution 5 mg (5 mg Nebulization Given 05/23/24 1308)  methylPREDNISolone  sodium succinate (SOLU-MEDROL ) 125 mg/2 mL injection 125 mg (125 mg Intravenous Given 05/23/24 1333)  albuterol  (PROVENTIL ) (2.5 MG/3ML) 0.083% nebulizer solution 5 mg (5 mg Nebulization Given 05/23/24 1403)  guaiFENesin -dextromethorphan  (ROBITUSSIN DM) 100-10 MG/5ML syrup 15 mL (15 mLs Oral Given 05/23/24 1456)  albuterol  (PROVENTIL ) (2.5 MG/3ML) 0.083% nebulizer solution 5 mg (5 mg Nebulization Given 05/23/24 1448)                                     Medical Decision Making Amount and/or Complexity of Data Reviewed Labs: ordered. Radiology: ordered.  Risk OTC drugs. Prescription drug management.   Patient presents with clinical concern for acute bronchitis with possible undiagnosed COPD given chronic cigarette smoking use and wheezing.  Other differentials include pneumonia.  Patient denies recent surgery, active cancer, leg swelling, blood clot history.  No chest pain.  Patient has no known heart failure history no obvious evidence on chest x-ray no leg edema.  Patient's oxygen saturation improved and lung sounds improved with multiple breathing treatments.  Plan to reassess to see if patient requiring oxygen.  Blood work independently reviewed reassuring normal electrolytes, white blood cell count and hemoglobin normal.  Chest x-ray reviewed no signs of pneumonia. IV Solu-Medrol  given.  Patient initially 88 to 90%.  Patient received 4 breathing treatments and gradually improved.  Patient lung air movement improved significantly.  Had to change pulse ox as intermittently not picking up.  Prior to discharge patient's oxygen saturation 91 to 92% without oxygen.  Discussed further observation patient comfortable going home and understands reasons to return.  Patient has family at home for support.  Plan for prescription for steroids, antibiotics, cough meds and albuterol .     Final diagnoses:  Acute bronchitis, unspecified organism  History of cigar smoking    ED Discharge Orders          Ordered    azithromycin  (ZITHROMAX  Z-PAK) 250 MG tablet        05/23/24 1552    albuterol  (VENTOLIN  HFA) 108 (90 Base) MCG/ACT inhaler  Every  3 hours PRN        05/23/24 1552    predniSONE  (DELTASONE ) 20 MG tablet        05/23/24 1552    guaiFENesin -dextromethorphan  (ROBITUSSIN DM) 100-10 MG/5ML syrup  Every 4 hours PRN        05/23/24 1552               Rendon Howell, MD 05/23/24 1554

## 2024-05-23 NOTE — ED Triage Notes (Signed)
 Pt POV reporting URI sx with associated SOB, worsened last night. O2 89-90% RA in triage.

## 2024-05-23 NOTE — Discharge Instructions (Signed)
 Take antibiotics for 5 days. Return for worsening or persistent shortness of breath or new concerns. Use Tylenol  every 4 as needed for pain or fevers. Use breathing treatments every 3-4 hours for wheezing.

## 2024-06-03 DIAGNOSIS — J209 Acute bronchitis, unspecified: Secondary | ICD-10-CM | POA: Diagnosis not present

## 2024-06-03 DIAGNOSIS — J302 Other seasonal allergic rhinitis: Secondary | ICD-10-CM | POA: Diagnosis not present

## 2024-06-03 DIAGNOSIS — R5383 Other fatigue: Secondary | ICD-10-CM | POA: Diagnosis not present

## 2024-06-03 DIAGNOSIS — Z7182 Exercise counseling: Secondary | ICD-10-CM | POA: Diagnosis not present

## 2024-06-03 DIAGNOSIS — M51369 Other intervertebral disc degeneration, lumbar region without mention of lumbar back pain or lower extremity pain: Secondary | ICD-10-CM | POA: Diagnosis not present

## 2024-06-03 DIAGNOSIS — Z713 Dietary counseling and surveillance: Secondary | ICD-10-CM | POA: Diagnosis not present

## 2024-06-03 DIAGNOSIS — F17201 Nicotine dependence, unspecified, in remission: Secondary | ICD-10-CM | POA: Diagnosis not present

## 2024-06-03 DIAGNOSIS — R5381 Other malaise: Secondary | ICD-10-CM | POA: Diagnosis not present

## 2024-06-16 ENCOUNTER — Ambulatory Visit
Admission: RE | Admit: 2024-06-16 | Discharge: 2024-06-16 | Disposition: A | Discharge status: Home / Self Care | Source: Ambulatory Visit | Attending: Internal Medicine | Admitting: Internal Medicine

## 2024-06-16 DIAGNOSIS — Z1231 Encounter for screening mammogram for malignant neoplasm of breast: Secondary | ICD-10-CM

## 2024-06-16 HISTORY — DX: Malignant neoplasm of unspecified site of unspecified female breast: C50.919

## 2024-06-22 ENCOUNTER — Other Ambulatory Visit (HOSPITAL_COMMUNITY): Payer: Self-pay | Admitting: Radiology

## 2024-06-22 DIAGNOSIS — J209 Acute bronchitis, unspecified: Secondary | ICD-10-CM

## 2024-06-29 DIAGNOSIS — H02422 Myogenic ptosis of left eyelid: Secondary | ICD-10-CM | POA: Diagnosis not present

## 2024-06-29 DIAGNOSIS — H0279 Other degenerative disorders of eyelid and periocular area: Secondary | ICD-10-CM | POA: Diagnosis not present

## 2024-06-29 DIAGNOSIS — H57813 Brow ptosis, bilateral: Secondary | ICD-10-CM | POA: Diagnosis not present

## 2024-06-29 DIAGNOSIS — Z01818 Encounter for other preprocedural examination: Secondary | ICD-10-CM | POA: Diagnosis not present

## 2024-07-08 DIAGNOSIS — M5416 Radiculopathy, lumbar region: Secondary | ICD-10-CM | POA: Diagnosis not present

## 2024-07-14 ENCOUNTER — Encounter (HOSPITAL_COMMUNITY)

## 2024-07-26 ENCOUNTER — Ambulatory Visit (HOSPITAL_COMMUNITY)
Admission: RE | Admit: 2024-07-26 | Discharge: 2024-07-26 | Disposition: A | Discharge status: Home / Self Care | Source: Ambulatory Visit | Attending: Internal Medicine | Admitting: Internal Medicine

## 2024-07-26 DIAGNOSIS — J209 Acute bronchitis, unspecified: Secondary | ICD-10-CM | POA: Insufficient documentation

## 2024-07-26 LAB — PULMONARY FUNCTION TEST
DL/VA % pred: 73 %
DL/VA: 3 ml/min/mmHg/L
DLCO unc % pred: 72 %
DLCO unc: 15.05 ml/min/mmHg
FEF 25-75 Post: 1.9 L/s
FEF 25-75 Pre: 2.2 L/s
FEF2575-%Change-Post: -13 %
FEF2575-%Pred-Post: 92 %
FEF2575-%Pred-Pre: 106 %
FEV1-%Change-Post: -2 %
FEV1-%Pred-Post: 92 %
FEV1-%Pred-Pre: 95 %
FEV1-Post: 2.31 L
FEV1-Pre: 2.37 L
FEV1FVC-%Change-Post: 2 %
FEV1FVC-%Pred-Pre: 102 %
FEV6-%Change-Post: -5 %
FEV6-%Pred-Post: 91 %
FEV6-%Pred-Pre: 96 %
FEV6-Post: 2.89 L
FEV6-Pre: 3.04 L
FEV6FVC-%Pred-Post: 104 %
FEV6FVC-%Pred-Pre: 104 %
FVC-%Change-Post: -5 %
FVC-%Pred-Post: 88 %
FVC-%Pred-Pre: 92 %
FVC-Post: 2.89 L
FVC-Pre: 3.04 L
Post FEV1/FVC ratio: 80 %
Post FEV6/FVC ratio: 100 %
Pre FEV1/FVC ratio: 78 %
Pre FEV6/FVC Ratio: 100 %
RV % pred: 130 %
RV: 2.95 L
TLC % pred: 113 %
TLC: 6.07 L

## 2024-07-26 MED ORDER — ALBUTEROL SULFATE (2.5 MG/3ML) 0.083% IN NEBU
2.5000 mg | INHALATION_SOLUTION | Freq: Once | RESPIRATORY_TRACT | Status: AC
  Administered 2024-07-26: 2.5 mg via RESPIRATORY_TRACT

## 2024-08-04 DIAGNOSIS — M51369 Other intervertebral disc degeneration, lumbar region without mention of lumbar back pain or lower extremity pain: Secondary | ICD-10-CM | POA: Diagnosis not present

## 2024-08-04 DIAGNOSIS — M13872 Other specified arthritis, left ankle and foot: Secondary | ICD-10-CM | POA: Diagnosis not present

## 2024-08-04 DIAGNOSIS — H02539 Eyelid retraction unspecified eye, unspecified lid: Secondary | ICD-10-CM | POA: Diagnosis not present

## 2024-08-04 DIAGNOSIS — I2510 Atherosclerotic heart disease of native coronary artery without angina pectoris: Secondary | ICD-10-CM | POA: Diagnosis not present

## 2024-08-04 DIAGNOSIS — L659 Nonscarring hair loss, unspecified: Secondary | ICD-10-CM | POA: Diagnosis not present

## 2024-08-04 DIAGNOSIS — Z0000 Encounter for general adult medical examination without abnormal findings: Secondary | ICD-10-CM | POA: Diagnosis not present

## 2024-08-04 DIAGNOSIS — J209 Acute bronchitis, unspecified: Secondary | ICD-10-CM | POA: Diagnosis not present

## 2024-08-04 DIAGNOSIS — M549 Dorsalgia, unspecified: Secondary | ICD-10-CM | POA: Diagnosis not present

## 2024-08-04 DIAGNOSIS — F17201 Nicotine dependence, unspecified, in remission: Secondary | ICD-10-CM | POA: Diagnosis not present

## 2024-08-04 DIAGNOSIS — M5430 Sciatica, unspecified side: Secondary | ICD-10-CM | POA: Diagnosis not present

## 2024-09-22 ENCOUNTER — Other Ambulatory Visit: Payer: Self-pay | Admitting: Cardiology

## 2024-10-13 LAB — LAB REPORT - SCANNED
A1c: 5.9
Albumin, Urine POC: 8.8
Creatinine, POC: 57.1 mg/dL
EGFR: 86
Microalb Creat Ratio: 15

## 2024-10-14 ENCOUNTER — Ambulatory Visit: Payer: Self-pay | Admitting: Cardiovascular Disease
